# Patient Record
Sex: Male | Born: 1955
Health system: Southern US, Community
[De-identification: ages and names within clinical notes are randomized; demographics above are authoritative.]

## PROBLEM LIST (undated history)

## (undated) DIAGNOSIS — E785 Hyperlipidemia, unspecified: Secondary | ICD-10-CM

## (undated) DIAGNOSIS — R0683 Snoring: Secondary | ICD-10-CM

## (undated) DIAGNOSIS — M5126 Other intervertebral disc displacement, lumbar region: Secondary | ICD-10-CM

## (undated) DIAGNOSIS — M5430 Sciatica, unspecified side: Secondary | ICD-10-CM

## (undated) DIAGNOSIS — K219 Gastro-esophageal reflux disease without esophagitis: Secondary | ICD-10-CM

## (undated) DIAGNOSIS — I10 Essential (primary) hypertension: Secondary | ICD-10-CM

## (undated) DIAGNOSIS — G4700 Insomnia, unspecified: Secondary | ICD-10-CM

## (undated) HISTORY — DX: Sciatica, unspecified side: M54.30

## (undated) HISTORY — PX: WISDOM TOOTH EXTRACTION: SHX21

## (undated) HISTORY — DX: Hyperlipidemia, unspecified: E78.5

## (undated) HISTORY — PX: LUMBAR DISC SURGERY: SHX700

## (undated) HISTORY — DX: Insomnia, unspecified: G47.00

## (undated) HISTORY — DX: Essential (primary) hypertension: I10

## (undated) HISTORY — PX: COLONOSCOPY W/ POLYPECTOMY: SHX1380

## (undated) SURGERY — CLOSED REDUCTION, FRACTURE, NASAL BONE
Anesthesia: General

---

## 2008-10-22 ENCOUNTER — Emergency Department (HOSPITAL_BASED_OUTPATIENT_CLINIC_OR_DEPARTMENT_OTHER): Admission: EM | Admit: 2008-10-22 | Discharge: 2008-10-22 | Payer: Self-pay | Admitting: Emergency Medicine

## 2008-10-22 ENCOUNTER — Ambulatory Visit: Payer: Self-pay | Admitting: Radiology

## 2008-11-08 ENCOUNTER — Encounter (INDEPENDENT_AMBULATORY_CARE_PROVIDER_SITE_OTHER): Payer: Self-pay | Admitting: *Deleted

## 2008-11-16 ENCOUNTER — Ambulatory Visit: Payer: Self-pay | Admitting: Gastroenterology

## 2009-01-02 ENCOUNTER — Encounter (INDEPENDENT_AMBULATORY_CARE_PROVIDER_SITE_OTHER): Payer: Self-pay | Admitting: *Deleted

## 2009-01-09 ENCOUNTER — Ambulatory Visit: Payer: Self-pay | Admitting: Gastroenterology

## 2009-02-07 ENCOUNTER — Ambulatory Visit: Payer: Self-pay | Admitting: Gastroenterology

## 2009-02-12 ENCOUNTER — Encounter: Payer: Self-pay | Admitting: Gastroenterology

## 2009-10-08 ENCOUNTER — Encounter: Admission: RE | Admit: 2009-10-08 | Discharge: 2009-10-08 | Payer: Self-pay | Admitting: Otolaryngology

## 2010-01-23 ENCOUNTER — Telehealth: Payer: Self-pay | Admitting: Gastroenterology

## 2010-02-11 NOTE — Procedures (Signed)
Summary: Colonoscopy  Patient: [name] Note: All result statuses are Final unless otherwise noted.  Tests: (1) Colonoscopy (COL)   COL Colonoscopy           DONE     [facility]     [address]     [place], [place]  [address]           COLONOSCOPY PROCEDURE REPORT           PATIENT:  Mason Vargas, Mason Vargas  MR#:  [id number]     BIRTHDATE:  [birth year], 53 yrs. old  GENDER:  male           ENDOSCOPIST:  Barbette Hair. Arlyce Dice, MD     Referred by:           PROCEDURE DATE:  02/07/2009     PROCEDURE:  Colonoscopy with snare polypectomy     ASA CLASS:  Class I     INDICATIONS:  Routine Risk Screening           MEDICATIONS:   Fentanyl 75 mcg IV, Versed 8 mg IV           DESCRIPTION OF PROCEDURE:   After the risks benefits and     alternatives of the procedure were thoroughly explained, informed     consent was obtained.  Digital rectal exam was performed and     revealed no abnormalities.   The LB CF-H180AL [id number] endoscope     was introduced through the anus and advanced to the cecum, which     was identified by both the appendix and ileocecal valve, without     limitations.  The quality of the prep was excellent, using     MoviPrep.  The instrument was then slowly withdrawn as the colon     was fully examined.     <<PROCEDUREIMAGES>>           FINDINGS:  A pedunculated polyp was found in the sigmoid colon. It     was 2 cm in size. It was found 30 cm from the point of entry.     Polyp was snared, then cauterized with monopolar cautery.     Retrieval was successful (see image12 and image11). snare polyp     Mild diverticulosis was found in the sigmoid colon (see image13).     Retroflexed views in the rectum revealed no abnormalities.    The     scope was then withdrawn from the patient and the procedure     completed.           COMPLICATIONS:  None           ENDOSCOPIC IMPRESSION:     1) 2 cm pedunculated polyp in the sigmoid colon     2) Mild diverticulosis in the sigmoid  colon     RECOMMENDATIONS:     1) colonoscopy           REPEAT EXAM:  In 3 year(s) for Colonoscopy.           ______________________________     Barbette Hair. Arlyce Dice, MD           CC:  Aleatha Borer, MD           n.     Rosalie Doctor:   Barbette Hair. Aziz Slape at 02/07/2009 09:18 AM           Bari Mantis, [id number]  Note: An exclamation mark (!) indicates a result that was not dispersed into the flowsheet.  Document Creation Date: 02/07/2009 9:18 AM _______________________________________________________________________  (1) Order result status: Final Collection or observation date-time: 02/07/2009 09:09 Requested date-time:  Receipt date-time:  Reported date-time:  Referring Physician:   Ordering Physician: Melvia Heaps [phone]) Specimen Source:  Source: Launa Grill Order Number: [id number] Lab site:   Appended Document: Colonoscopy     Procedures Next Due Date:    Colonoscopy: 02/2012

## 2010-02-11 NOTE — Procedures (Signed)
Summary: Colonoscopy  Patient: Mason Vargas Note: All result statuses are Final unless otherwise noted.  Tests: (1) Colonoscopy (COL)   COL Colonoscopy           DONE (C)     [facility]     [address]     [place], [place]  [address]           COLONOSCOPY PROCEDURE REPORT           PATIENT:  [name], [name]  MR#:  [id number]     BIRTHDATE:  [birth year], 53 yrs. old  GENDER:  male           ENDOSCOPIST:  Barbette Hair. Arlyce Dice, MD     Referred by:           PROCEDURE DATE:  02/07/2009     PROCEDURE:  Colonoscopy with snare polypectomy     ASA CLASS:  Class I     INDICATIONS:  Routine Risk Screening           MEDICATIONS:   Fentanyl 75 mcg IV, Versed 8 mg IV, benadryl 25mg      IV           DESCRIPTION OF PROCEDURE:   After the risks benefits and     alternatives of the procedure were thoroughly explained, informed     consent was obtained.  Digital rectal exam was performed and     revealed no abnormalities.   The LB CF-H180AL [id number] endoscope     was introduced through the anus and advanced to the cecum, which     was identified by both the appendix and ileocecal valve, without     limitations.  The quality of the prep was excellent, using     MoviPrep.  The instrument was then slowly withdrawn as the colon     was fully examined.     <<PROCEDUREIMAGES>>           FINDINGS:  A pedunculated polyp was found in the sigmoid colon. It     was 2 cm in size. It was found 30 cm from the point of entry.     Polyp was snared, then cauterized with monopolar cautery.     Retrieval was successful (see image12 and image11). snare polyp     Mild diverticulosis was found in the sigmoid colon (see image13).     Retroflexed views in the rectum revealed no abnormalities.    The     scope was then withdrawn from the patient and the procedure     completed.           COMPLICATIONS:  None           ENDOSCOPIC IMPRESSION:     1) 2 cm pedunculated polyp in the sigmoid colon     2) Mild  diverticulosis in the sigmoid colon     RECOMMENDATIONS:     1) colonoscopy           REPEAT EXAM:  In 3 year(s) for Colonoscopy.           ______________________________     Barbette Hair. Arlyce Dice, MD           CC:  Aleatha Borer, MD           n.     REVISED:  02/07/2009 09:56 AM     eSIGNED:   Barbette Hair. Krissa Utke at 02/07/2009 09:56 AM           Bari Mantis,  [id number]  Note: An exclamation mark (!) indicates a result that was not dispersed into the flowsheet. Document Creation Date: 02/07/2009 9:56 AM _______________________________________________________________________  (1) Order result status: Final Collection or observation date-time: 02/07/2009 09:09 Requested date-time:  Receipt date-time:  Reported date-time:  Referring Physician:   Ordering Physician: Melvia Heaps [phone]) Specimen Source:  Source: Launa Grill Order Number: [id number] Lab site:

## 2010-02-11 NOTE — Letter (Signed)
Summary: Patient Notice- Polyp Results  [facility]  [address] [place], [place] [address]   Phone: [phone]  Fax: [phone]        February 12, 2009 MRN: [id number]    [facility] Mason Vargas [address] [address] [place], [place]  [address]    Dear Mason Vargas,  I am pleased to inform you that the colon polyp(s) removed during your recent colonoscopy was (were) found to be benign (no cancer detected) upon pathologic examination.  I recommend you have a repeat colonoscopy examination in 3_ years to look for recurrent polyps, as having colon polyps increases your risk for having recurrent polyps or even colon cancer in the future.  Should you develop new or worsening symptoms of abdominal pain, bowel habit changes or bleeding from the rectum or bowels, please schedule an evaluation with either your primary care physician or with me.  Additional information/recommendations:  __ No further action with gastroenterology is needed at this time. Please      follow-up with your primary care physician for your other healthcare      needs.  __ Please call [phone] to schedule a return visit to review your      situation.  __ Please keep your follow-up visit as already scheduled.  __x Continue treatment plan as outlined the day of your exam.  Please call us if you are having persistent problems or have questions about your condition that have not been fully answered at this time.  Sincerely,  Mason Meckel MD  This letter has been electronically signed by your physician.  Appended Document: Patient Notice- Polyp Results letter mailed 2.2.11

## 2010-02-13 NOTE — Progress Notes (Signed)
Summary: Question fo rnurse  Phone Note Call from Patient Call back at [facility] Phone [phone]   Call For: Dr Arlyce Dice Reason for Call: Talk to Nurse Summary of Call: Had a Colonlast year January and has a few questions for nurse. Initial call taken by: Leanor Kail [facility],  January 23, 2010 11:26 AM  Follow-up for Phone Call        Left message to call back Follow-up by: Selinda Michaels RN,  January 23, 2010 12:27 PM  Additional Follow-up for Phone Call Additional follow up Details #1::        Patient wanted to check to make sure when his next colon is supposed to be done. Per Dr. Arlyce Dice should repeat colon in 3 years from 1/11. Recall letter is in IDX. Patient aware. Additional Follow-up by: Selinda Michaels RN,  January 23, 2010 3:02 PM

## 2010-04-17 LAB — POCT CARDIAC MARKERS
CKMB, poc: 1.4 ng/mL (ref 1.0–8.0)
Myoglobin, poc: 45.5 ng/mL (ref 12–200)
Myoglobin, poc: 46.4 ng/mL (ref 12–200)
Troponin i, poc: <0.05 ng/mL (ref 0.00–0.09)

## 2010-04-17 LAB — BASIC METABOLIC PANEL
BUN: 17 mg/dL (ref 6–23)
Chloride: 104 mEq/L (ref 96–112)
Creatinine, Ser: 0.9 mg/dL (ref 0.4–1.5)
GFR calc Af Amer: >60 mL/min (ref >60)
Sodium: 141 mEq/L (ref 135–145)

## 2010-04-17 LAB — D-DIMER, QUANTITATIVE: D-Dimer, Quant: <0.22 ug/mL-FEU (ref 0.00–0.48)

## 2010-04-17 LAB — DIFFERENTIAL
Basophils Absolute: 0 10*3/uL (ref 0.0–0.1)
Basophils Relative: 1 % (ref 0–1)
Lymphocytes Relative: 19 % (ref 12–46)
Monocytes Absolute: 0.4 10*3/uL (ref 0.1–1.0)
Monocytes Relative: 7 % (ref 3–12)

## 2010-04-17 LAB — CBC
MCV: 81.3 fL (ref 78.0–100.0)
Platelets: 222 10*3/uL (ref 150–400)

## 2011-06-02 DIAGNOSIS — N529 Male erectile dysfunction, unspecified: Secondary | ICD-10-CM | POA: Insufficient documentation

## 2011-06-11 DIAGNOSIS — E559 Vitamin D deficiency, unspecified: Secondary | ICD-10-CM | POA: Insufficient documentation

## 2011-06-11 DIAGNOSIS — D126 Benign neoplasm of colon, unspecified: Secondary | ICD-10-CM | POA: Insufficient documentation

## 2011-08-10 DIAGNOSIS — Z8249 Family history of ischemic heart disease and other diseases of the circulatory system: Secondary | ICD-10-CM | POA: Insufficient documentation

## 2012-01-13 HISTORY — PX: COLONOSCOPY W/ POLYPECTOMY: SHX1380

## 2012-01-21 ENCOUNTER — Encounter: Payer: Self-pay | Admitting: Gastroenterology

## 2012-02-29 ENCOUNTER — Encounter: Payer: Self-pay | Admitting: Gastroenterology

## 2012-03-01 ENCOUNTER — Encounter: Payer: Self-pay | Admitting: Gastroenterology

## 2012-03-03 DIAGNOSIS — N400 Enlarged prostate without lower urinary tract symptoms: Secondary | ICD-10-CM | POA: Insufficient documentation

## 2012-04-01 ENCOUNTER — Encounter: Payer: Self-pay | Admitting: Gastroenterology

## 2012-08-09 ENCOUNTER — Encounter: Payer: Self-pay | Admitting: Gastroenterology

## 2012-09-08 ENCOUNTER — Encounter: Payer: Self-pay | Admitting: Gastroenterology

## 2012-11-04 ENCOUNTER — Ambulatory Visit (AMBULATORY_SURGERY_CENTER): Payer: Self-pay

## 2012-11-04 VITALS — Ht 72.0 in | Wt 200.0 lb

## 2012-11-04 DIAGNOSIS — Z860100 Personal history of colon polyps, unspecified: Secondary | ICD-10-CM

## 2012-11-04 MED ORDER — SUPREP BOWEL PREP KIT 17.5-3.13-1.6 GM/177ML PO SOLN: 1.0000 | Freq: Once | ORAL | Status: DC

## 2012-11-18 ENCOUNTER — Ambulatory Visit (AMBULATORY_SURGERY_CENTER): Payer: 59 | Admitting: Gastroenterology

## 2012-11-18 ENCOUNTER — Encounter: Payer: Self-pay | Admitting: Gastroenterology

## 2012-11-18 VITALS — BP 117/79 | HR 65 | Temp 96.5°F | Resp 13 | Ht 72.0 in | Wt 200.0 lb

## 2012-11-18 DIAGNOSIS — D126 Benign neoplasm of colon, unspecified: Secondary | ICD-10-CM

## 2012-11-18 DIAGNOSIS — K5730 Diverticulosis of large intestine without perforation or abscess without bleeding: Secondary | ICD-10-CM

## 2012-11-18 DIAGNOSIS — K648 Other hemorrhoids: Secondary | ICD-10-CM

## 2012-11-18 DIAGNOSIS — Z86010 Personal history of colonic polyps: Secondary | ICD-10-CM

## 2012-11-18 MED ORDER — SODIUM CHLORIDE 0.9 % IV SOLN: 500.0000 mL | INTRAVENOUS | Status: DC

## 2012-11-18 NOTE — Progress Notes (Signed)
Patient did not experience any of the following events: a burn prior to discharge; a fall within the facility; wrong site/side/patient/procedure/implant event; or a hospital transfer or hospital admission upon discharge from the facility. (G8907) Patient did not have preoperative order for IV antibiotic SSI prophylaxis. (G8918)  

## 2012-11-18 NOTE — Progress Notes (Signed)
Procedure ends, to recovery, report given and VSS. 

## 2012-11-18 NOTE — Patient Instructions (Signed)

## 2012-11-18 NOTE — Op Note (Signed)
[facility] [address] [place], [address]   COLONOSCOPY PROCEDURE REPORT  PATIENT: Mason Vargas, Mason Vargas  MR#: [id number] BIRTHDATE: 05-01-1955 , 57  yrs. old GENDER: Male ENDOSCOPIST: Louis Meckel, MD REFERRED XB:JYNWG Kathrynn Running, M.D. PROCEDURE DATE:  11/18/2012 PROCEDURE:   Colonoscopy with cold biopsy polypectomy First Screening Colonoscopy - Avg.  risk and is 50 yrs.  old or older - No.  Prior Negative Screening - Now for repeat screening. N/A  History of Adenoma - Now for follow-up colonoscopy & has been > or = to 3 yrs.  Yes hx of adenoma.  Has been 3 or more years since last colonoscopy.  Polyps Removed Today? Yes. ASA CLASS:   Class II INDICATIONS:Patient's personal history of colon polyps. 2011 (2cm pedunculated polyp) MEDICATIONS: MAC sedation, administered by CRNA and Propofol (Diprivan) 270 mg IV  DESCRIPTION OF PROCEDURE:   After the risks benefits and alternatives of the procedure were thoroughly explained, informed consent was obtained.  A digital rectal exam revealed no abnormalities of the rectum.   The LB [id number]  endoscope was introduced through the anus and advanced to the cecum, which was identified by both the appendix and ileocecal valve. No adverse events experienced.   The quality of the prep was excellent using Suprep  The instrument was then slowly withdrawn as the colon was fully examined.      COLON FINDINGS: A sessile polyp measuring 2 mm in size was found in the distal transverse colon.  A polypectomy was performed with cold forceps.   Mild diverticulosis was noted in the sigmoid colon. Internal hemorrhoids were found.   The colon was otherwise normal. There was no diverticulosis, inflammation, polyps or cancers unless previously stated.  Retroflexed views revealed no abnormalities. The time to cecum=3 minutes 27 seconds.  Withdrawal time=9 minutes 03 seconds.  The scope was withdrawn and the procedure  completed. COMPLICATIONS: There were no complications.  ENDOSCOPIC IMPRESSION: 1.   Sessile polyp measuring 2 mm in size was found in the distal transverse colon; polypectomy was performed with cold forceps 2.   Mild diverticulosis was noted in the sigmoid colon 3.   Internal hemorrhoids 4.   The colon was otherwise normal  RECOMMENDATIONS: If the polyp(s) removed today are proven to be adenomatous (pre-cancerous) polyps, you will need a repeat colonoscopy in 5 years.  Otherwise  I recommend followup colonoscopy in 7 years. You will receive a letter within 1-2 weeks with the results of your biopsy as well as final recommendations.  Please call my office if you have not received a letter after 3 weeks.   eSigned:  Louis Meckel, MD 11/18/2012 12:04 PM   cc:   PATIENT NAME:  [name], [name] MR#: [id number]

## 2012-11-18 NOTE — Progress Notes (Signed)
Called to room to assist during endoscopic procedure.  Patient ID and intended procedure confirmed with present staff. Received instructions for my participation in the procedure from the performing physician.  

## 2012-11-21 ENCOUNTER — Telehealth: Payer: Self-pay | Admitting: *Deleted

## 2012-11-21 NOTE — Telephone Encounter (Signed)
Left message that we called for f/u 

## 2012-11-23 ENCOUNTER — Encounter: Payer: Self-pay | Admitting: Gastroenterology

## 2013-10-17 ENCOUNTER — Encounter: Payer: Self-pay | Admitting: Internal Medicine

## 2013-10-17 ENCOUNTER — Ambulatory Visit (INDEPENDENT_AMBULATORY_CARE_PROVIDER_SITE_OTHER): Payer: 59 | Admitting: Internal Medicine

## 2013-10-17 ENCOUNTER — Encounter (INDEPENDENT_AMBULATORY_CARE_PROVIDER_SITE_OTHER): Payer: Self-pay

## 2013-10-17 VITALS — BP 130/90 | HR 74 | Ht 72.0 in | Wt 207.2 lb

## 2013-10-17 DIAGNOSIS — R030 Elevated blood-pressure reading, without diagnosis of hypertension: Secondary | ICD-10-CM

## 2013-10-17 DIAGNOSIS — IMO0001 Reserved for inherently not codable concepts without codable children: Secondary | ICD-10-CM

## 2013-10-17 DIAGNOSIS — R002 Palpitations: Secondary | ICD-10-CM

## 2013-10-17 DIAGNOSIS — Z8249 Family history of ischemic heart disease and other diseases of the circulatory system: Secondary | ICD-10-CM

## 2013-10-17 NOTE — Progress Notes (Signed)
ELECTROPHYSIOLOGY CONSULT NOTE  Patient ID: Mason Vargas, MRN: [id number], DOB/AGE: [birth year] 58 y.o. Admit date: (Not on file) Date of Consult: 10/17/2013  Primary Physician: Vickii Penna., MD Primary Cardiologist: new  Chief Complaint:  palpitations   HPI Mason Vargas is a 58 y.o. male  With 2 month hx of palpitations which is wife (FNP) described as skips and by ECG >> PACs  They have been quiet since the elimination of caffeine  They could last for hours and days and were annoying, notably mostly with sitting, and not with exercise and surprisingly not while lying down   The patient denies chest pain, shortness of breath, nocturnal dyspnea, orthopnea or peripheral edema.  There have been no  lightheadedness or syncope.   Father had heart transplant in his 82s, with "small heart attack" in 27s and 46s  Past Medical History  Diagnosis Date  . Hypertension   . Insomnia   . Hyperlipidemia       Surgical History:  Past Surgical History  Procedure Laterality Date  . Colonoscopy w/ polypectomy       Home Meds: Prior to Admission medications   Medication Sig Start Date End Date Taking? Authorizing Provider  aspirin 81 MG tablet Take 81 mg by mouth daily.   Yes Historical Provider, MD  atorvastatin (LIPITOR) 20 MG tablet Take 20 mg by mouth daily.   Yes Historical Provider, MD  cholecalciferol (VITAMIN D) 400 UNITS TABS tablet Take 5,000 Units by mouth.   Yes Historical Provider, MD  Omega-3 Fatty Acids (FISH OIL) 500 MG CAPS Take by mouth.   Yes Historical Provider, MD     Allergies: No Known Allergies  History   Social History  . Marital Status: Divorced    Spouse Name: N/A    Number of Children: N/A  . Years of Education: N/A   Occupational History  . Not on file.   Social History Main Topics  . Smoking status: Never Smoker   . Smokeless tobacco: Never Used  . Alcohol Use: Yes  . Drug Use: No  . Sexual Activity: Not on file   Other Topics Concern  . Not on  file   Social History Narrative  . No narrative on file     Family History  Problem Relation Age of Onset  . Colon cancer Neg Hx   . Pancreatic cancer Neg Hx   . Stomach cancer Neg Hx   . Heart attack Father   . Heart Problems Father      ROS:  Please see the history of present illness.     All other systems reviewed and negative.    Physical Exam:   Blood pressure 130/90, pulse 74, height 6' (1.829 m), weight 207 lb 4 oz (94.008 kg). Alert and oriented in no acute distress HENT- normal Eyes- EOMI, without scleral icterus Skin- warm and dry; without rashes LN-neg Neck- supple without thyromegaly, JVP-flat, carotids brisk and full without bruits Back-without CVAT or kyphosis Lungs-clear to auscultation CV-Regular rate and rhythm, nl S1 and S2, no murmurs gallops or rubs, S4-absent Abd-soft with active bowel sounds; no midline pulsation or hepatomegaly Pulses-intact femoral and distal MKS-without gross deformity Neuro- Ax O, CN3-12 intact, grossly normal motor and sensory function Affect engaging       Labs: Cardiac Enzymes No results found for this basename: CKTOTAL, CKMB, TROPONINI,  in the last 72 hours CBC Lab Results  Component Value Date   WBC 5.4 10/22/2008   HGB 15.5 10/22/2008  HCT 45.0 10/22/2008   MCV 81.3 10/22/2008   PLT 222 10/22/2008   PROTIME: No results found for this basename: LABPROT, INR,  in the last 72 hours Chemistry No results found for this basename: NA, K, CL, CO2, BUN, CREATININE, CALCIUM, LABALBU, PROT, BILITOT, ALKPHOS, ALT, AST, GLUCOSE,  in the last 168 hours Lipids No results found for this basename: CHOL, HDL, LDLCALC, TRIG   BNP No results found for this basename: probnp   Miscellaneous Lab Results  Component Value Date   DDIMER  Value: <0.22        AT THE INHOUSE ESTABLISHED CUTOFF VALUE OF 0.48 ug/mL FEU, THIS ASSAY HAS BEEN DOCUMENTED IN THE LITERATURE TO HAVE A SENSITIVITY AND NEGATIVE PREDICTIVE VALUE OF AT LEAST 98  TO 99%.  THE TEST RESULT SHOULD BE CORRELATED WITH AN ASSESSMENT OF THE CLINICAL PROBABILITY OF DVT / VTE. 10/22/2008    Radiology/Studies:  No results found.  EKG: NSR 74 13/11/40  Review of outside ECG  >> sinus 70 15/10/38 Isolated PAC  Assessment and Plan:   Palpitations - prob PACs  fhx of premature heart disease  Elevated blood pressure    ECG and wifes auscultation suggests premature beats as opposed to afib.  He has remedied the situation already with near elimination of caffeine and feels great  i have told him of ALIVECOR in theevent that symptoms recur or there is concern regarding the nature of the palpitations  We will explore his family hx as premature CAD would invite ASA but his hx of transplant raises possibility of noniscemic CM and this the possibility of familial disease  Virl Axe

## 2013-10-17 NOTE — Patient Instructions (Signed)
Your physician recommends that you schedule a follow-up appointment in:  As needed  Your physician recommends that you continue on your current medications as directed. Please refer to the Current Medication list given to you today.  

## 2014-03-22 DIAGNOSIS — M5126 Other intervertebral disc displacement, lumbar region: Secondary | ICD-10-CM | POA: Insufficient documentation

## 2014-07-05 ENCOUNTER — Encounter: Payer: Self-pay | Admitting: Gastroenterology

## 2015-01-18 MED FILL — LOSARTAN-HCTZ 50-12.5 MG TA: 50-12.5 | 30 days supply | Qty: 30 | Fill #7

## 2015-02-04 MED FILL — ATORVASTATIN 20 MG TABLET: 20 | 30 days supply | Qty: 30 | Fill #8

## 2015-02-21 MED FILL — LOSARTAN-HCTZ 50-12.5 MG TA: 50-12.5 | 30 days supply | Qty: 30 | Fill #8

## 2015-03-05 MED FILL — ATORVASTATIN 20 MG TABLET: 20 | 30 days supply | Qty: 30 | Fill #9

## 2015-03-21 MED FILL — LOSARTAN-HCTZ 50-12.5 MG TA: 50-12.5 | 30 days supply | Qty: 30 | Fill #9

## 2015-04-02 MED FILL — ATORVASTATIN 20 MG TABLET: 20 | 30 days supply | Qty: 30 | Fill #10

## 2015-04-22 MED FILL — LOSARTAN-HCTZ 50-12.5 MG TA: 50-12.5 | 30 days supply | Qty: 30 | Fill #10

## 2015-04-29 MED FILL — ATORVASTATIN 20 MG TABLET: 20 | 90 days supply | Qty: 90 | Fill #0

## 2015-05-23 MED FILL — LOSARTAN-HCTZ 50-12.5 MG TA: 50-12.5 | 30 days supply | Qty: 30 | Fill #11

## 2015-06-25 MED FILL — LOSARTAN-HCTZ 50-12.5 MG TA: 50-12.5 | 90 days supply | Qty: 90 | Fill #0

## 2015-07-30 MED FILL — ATORVASTATIN 20 MG TABLET: 20 | 90 days supply | Qty: 90 | Fill #1

## 2015-09-20 MED FILL — LOSARTAN-HCTZ 50-12.5 MG TA: 50-12.5 | 90 days supply | Qty: 90 | Fill #1

## 2015-10-29 MED FILL — ATORVASTATIN 20 MG TABLET: 20 | 90 days supply | Qty: 90 | Fill #2

## 2015-12-24 MED FILL — LOSARTAN-HCTZ 50-12.5 MG TA: 50-12.5 | 90 days supply | Qty: 90 | Fill #2

## 2015-12-26 MED FILL — OMEPRAZOLE DR 40 MG CAPSULE: 40 | 30 days supply | Qty: 60 | Fill #0

## 2016-01-20 MED FILL — OMEPRAZOLE DR 40 MG CAPSULE: 40 | 30 days supply | Qty: 60 | Fill #1

## 2016-01-31 MED FILL — ATORVASTATIN 20 MG TABLET: 20 | 90 days supply | Qty: 90 | Fill #3

## 2016-03-19 MED FILL — OMEPRAZOLE DR 20 MG CAPSULE: 20 | 90 days supply | Qty: 90 | Fill #0

## 2016-04-01 MED FILL — LOSARTAN-HCTZ 50-12.5 MG TA: 50-12.5 | 90 days supply | Qty: 90 | Fill #3

## 2016-04-23 MED FILL — ATORVASTATIN 20 MG TABLET: 20 | 90 days supply | Qty: 90 | Fill #0

## 2016-06-25 DIAGNOSIS — K219 Gastro-esophageal reflux disease without esophagitis: Secondary | ICD-10-CM | POA: Diagnosis not present

## 2016-06-25 DIAGNOSIS — S93422A Sprain of deltoid ligament of left ankle, initial encounter: Secondary | ICD-10-CM | POA: Diagnosis not present

## 2016-06-25 DIAGNOSIS — Z0000 Encounter for general adult medical examination without abnormal findings: Secondary | ICD-10-CM | POA: Diagnosis not present

## 2016-06-25 DIAGNOSIS — I10 Essential (primary) hypertension: Secondary | ICD-10-CM | POA: Diagnosis not present

## 2016-06-25 MED FILL — LOSARTAN-HCTZ 50-12.5 MG TA: 50-12.5 | 90 days supply | Qty: 90 | Fill #0

## 2016-07-13 DIAGNOSIS — I10 Essential (primary) hypertension: Secondary | ICD-10-CM | POA: Diagnosis not present

## 2016-07-13 DIAGNOSIS — M1812 Unilateral primary osteoarthritis of first carpometacarpal joint, left hand: Secondary | ICD-10-CM | POA: Diagnosis not present

## 2016-07-13 DIAGNOSIS — M25542 Pain in joints of left hand: Secondary | ICD-10-CM | POA: Diagnosis not present

## 2016-07-24 MED FILL — ATORVASTATIN 20 MG TABLET: 20 | 90 days supply | Qty: 90 | Fill #1

## 2016-10-26 MED FILL — ATORVASTATIN 20 MG TABLET: 20 | 90 days supply | Qty: 90 | Fill #2

## 2016-10-30 DIAGNOSIS — R1031 Right lower quadrant pain: Secondary | ICD-10-CM | POA: Diagnosis not present

## 2016-10-30 DIAGNOSIS — K5900 Constipation, unspecified: Secondary | ICD-10-CM | POA: Diagnosis not present

## 2016-10-30 DIAGNOSIS — R197 Diarrhea, unspecified: Secondary | ICD-10-CM | POA: Diagnosis not present

## 2016-12-30 MED FILL — OMEPRAZOLE 20 MG CAP: 20 | 90 days supply | Qty: 90 | Fill #1

## 2017-01-19 MED FILL — ATORVASTATIN 20 MG TABLET: 20 | 90 days supply | Qty: 90 | Fill #3

## 2017-02-10 MED FILL — OSELTAMIVIR PHOSPHATE 75 MG: 75 | 10 days supply | Qty: 10 | Fill #0

## 2017-04-12 MED FILL — ATORVASTATIN 20 MG TABLET: 20 | 90 days supply | Qty: 90 | Fill #0

## 2017-04-12 MED FILL — OMEPRAZOLE 20 MG CAP: 20 | 90 days supply | Qty: 90 | Fill #0

## 2017-04-29 DIAGNOSIS — M545 Low back pain, unspecified: Secondary | ICD-10-CM | POA: Insufficient documentation

## 2017-04-29 DIAGNOSIS — Z6825 Body mass index (BMI) 25.0-25.9, adult: Secondary | ICD-10-CM | POA: Diagnosis not present

## 2017-04-29 DIAGNOSIS — I10 Essential (primary) hypertension: Secondary | ICD-10-CM | POA: Diagnosis not present

## 2017-04-29 DIAGNOSIS — Z0000 Encounter for general adult medical examination without abnormal findings: Secondary | ICD-10-CM | POA: Diagnosis not present

## 2017-04-29 MED FILL — LOSARTAN-HCTZ 50-12.5 MG TA: 50-12.5 | 90 days supply | Qty: 90 | Fill #0

## 2017-07-19 MED FILL — ATORVASTATIN 20 MG TABLET: 20 | 90 days supply | Qty: 90 | Fill #0

## 2017-09-02 MED FILL — OMEPRAZOLE 20 MG CAP: 20 | 90 days supply | Qty: 90 | Fill #1

## 2017-10-18 MED FILL — LOSARTAN-HCTZ 50-12.5 MG TA: 50-12.5 | 90 days supply | Qty: 90 | Fill #1

## 2017-10-18 MED FILL — ATORVASTATIN CALCIUM 20 MG: 20 | 90 days supply | Qty: 90 | Fill #1

## 2018-01-18 MED FILL — ATORVASTATIN CALCIUM 20 MG: 20 | 90 days supply | Qty: 90 | Fill #2

## 2018-03-14 ENCOUNTER — Ambulatory Visit: Payer: 59 | Admitting: Family Medicine

## 2018-03-15 DIAGNOSIS — L821 Other seborrheic keratosis: Secondary | ICD-10-CM | POA: Diagnosis not present

## 2018-03-15 DIAGNOSIS — D225 Melanocytic nevi of trunk: Secondary | ICD-10-CM | POA: Diagnosis not present

## 2018-03-15 DIAGNOSIS — L820 Inflamed seborrheic keratosis: Secondary | ICD-10-CM | POA: Diagnosis not present

## 2018-03-15 DIAGNOSIS — D2239 Melanocytic nevi of other parts of face: Secondary | ICD-10-CM | POA: Diagnosis not present

## 2018-03-15 DIAGNOSIS — L814 Other melanin hyperpigmentation: Secondary | ICD-10-CM | POA: Diagnosis not present

## 2018-03-15 MED FILL — TRIAMCINOLONE 0.1% OINTMENT: 0.1 | 14 days supply | Qty: 80 | Fill #0

## 2018-03-20 ENCOUNTER — Encounter (HOSPITAL_COMMUNITY): Payer: Self-pay | Admitting: *Deleted

## 2018-03-20 ENCOUNTER — Ambulatory Visit (HOSPITAL_COMMUNITY)
Admission: EM | Admit: 2018-03-20 | Discharge: 2018-03-20 | Disposition: A | Discharge status: Home / Self Care | Payer: BLUE CROSS/BLUE SHIELD | Attending: Family Medicine | Admitting: Family Medicine

## 2018-03-20 DIAGNOSIS — W19XXXA Unspecified fall, initial encounter: Secondary | ICD-10-CM | POA: Diagnosis not present

## 2018-03-20 DIAGNOSIS — I10 Essential (primary) hypertension: Secondary | ICD-10-CM | POA: Diagnosis not present

## 2018-03-20 DIAGNOSIS — M5432 Sciatica, left side: Secondary | ICD-10-CM | POA: Diagnosis not present

## 2018-03-20 DIAGNOSIS — R000 Tachycardia, unspecified: Secondary | ICD-10-CM

## 2018-03-20 HISTORY — DX: Other intervertebral disc displacement, lumbar region: M51.26

## 2018-03-20 MED ORDER — HYDROCODONE-ACETAMINOPHEN 5-325 MG PO TABS: 1.0000 | ORAL_TABLET | Freq: Four times a day (QID) | ORAL | 0 refills | Status: DC | PRN

## 2018-03-20 MED ORDER — PREDNISONE 50 MG PO TABS: ORAL_TABLET | ORAL | 0 refills | Status: DC

## 2018-03-20 NOTE — Discharge Instructions (Addendum)
After the prednisone runs out, you can use ibuprofen  If pain worsens or persists, please return or see your primary care provider.

## 2018-03-20 NOTE — ED Triage Notes (Signed)
Reports hx  Herniated lumbar disc.  Yesterday jumped off a log and felt a "pop" with significant low back pain.  Pain radiating down into LLE, but denies any parasthesias.  Denies any bowel or urinary incontinence.  Pt ambulatory, but guarded with ambulation.

## 2018-03-20 NOTE — ED Provider Notes (Signed)
[facility]    CSN: [id number] Arrival date & time: 03/20/18  1045     History   Chief Complaint Chief Complaint  Patient presents with  . Back Pain    HPI Mason Vargas is a 63 y.o. male.   New patient with history of L2- L3 herniated disk that acts up every 5- 6 years  Jumped a creek yesterday, was unable to get up without assistance, pain in left lower back going down into front of thigh, associated with numbness and tingling. Numbness and tingling have now resolved  Able to walk better today.  Also injured left hand when he fell.     Past Medical History:  Diagnosis Date  . Hyperlipidemia   . Hypertension   . Insomnia   . Lumbar herniated disc     There are no active problems to display for this patient.   Past Surgical History:  Procedure Laterality Date  . COLONOSCOPY W/ POLYPECTOMY         Home Medications    Prior to Admission medications   Medication Sig Start Date End Date Taking? Authorizing Provider  aspirin 81 MG tablet Take 81 mg by mouth daily.   Yes [provider]  atorvastatin (LIPITOR) 20 MG tablet Take 20 mg by mouth daily.   Yes [provider]  LOSARTAN POTASSIUM-HCTZ PO Take by mouth.   Yes [provider]  OMEPRAZOLE PO Take by mouth.   Yes [provider]  cholecalciferol (VITAMIN D) 400 UNITS TABS tablet Take 5,000 Units by mouth.    [provider]  HYDROcodone-acetaminophen (NORCO) 5-325 MG tablet Take 1 tablet by mouth every 6 (six) hours as needed for moderate pain. 03/20/18   Robyn Haber, MD  Omega-3 Fatty Acids (FISH OIL) 500 MG CAPS Take by mouth.    [provider]  predniSONE (DELTASONE) 50 MG tablet One daily with food 03/20/18   Robyn Haber, MD    Family History Family History  Problem Relation Age of Onset  . Heart attack Father   . Heart Problems Father   . Colon cancer Neg Hx   . Pancreatic cancer Neg Hx   . Stomach cancer Neg Hx      Social History Social History   Tobacco Use  . Smoking status: Never Smoker  . Smokeless tobacco: Never Used  Substance Use Topics  . Alcohol use: Yes    Comment: occasionally  . Drug use: No     Allergies   Patient has no known allergies.   Review of Systems Review of Systems  Genitourinary: Negative for difficulty urinating.  Musculoskeletal: Positive for back pain.  All other systems reviewed and are negative.    Physical Exam Triage Vital Signs ED Triage Vitals  Enc Vitals Group     BP 03/20/18 1105 125/80     Pulse Rate 03/20/18 1105 (!) 125     Resp 03/20/18 1105 18     Temp 03/20/18 1105 98.1 F (36.7 C)     Temp Source 03/20/18 1105 Oral     SpO2 03/20/18 1105 100 %     Weight --      Height --      Head Circumference --      Peak Flow --      Pain Score 03/20/18 1106 9     Pain Loc --      Pain Edu? --      Excl. in GC? --    No  data found.  Updated Vital Signs BP 125/80   Pulse (!) 125   Temp 98.1 F (36.7 C) (Oral)   Resp 18   SpO2 100%    Physical Exam Vitals signs and nursing note reviewed.  Constitutional:      Comments: Appears uncomfortable  Eyes:     Extraocular Movements: Extraocular movements intact.     Pupils: Pupils are equal, round, and reactive to light.  Neck:     Musculoskeletal: Normal range of motion and neck supple. No muscular tenderness.  Cardiovascular:     Rate and Rhythm: Regular rhythm. Tachycardia present.  Pulmonary:     Effort: Pulmonary effort is normal.  Musculoskeletal:     Comments: Good strength in thighs  SLR on left is limited because of pain Back is tender left paralumbar region. Able to dorsiflex and plantar flex feet normally (uncomfortable)  Left hand: no deformity.  Tender middle metacarpal  Skin:    General: Skin is warm and dry.  Neurological:     General: No focal deficit present.     Mental Status: He is alert and oriented to person, place, and time.      UC Treatments /  Results  Labs (all labs ordered are listed, but only abnormal results are displayed) Labs Reviewed - No data to display  EKG None  Radiology No results found.  Procedures Procedures (including critical care time)  Medications Ordered in UC Medications - No data to display  Initial Impression / Assessment and Plan / UC Course  I have reviewed the triage vital signs and the nursing notes.  Pertinent labs & imaging results that were available during my care of the patient were reviewed by me and considered in my medical decision making (see chart for details).    Final Clinical Impressions(s) / UC Diagnoses   Final diagnoses:  Sciatica of left side     Discharge Instructions     After the prednisone runs out, you can use ibuprofen  If pain worsens or persists, please return or see your primary care provider.    ED Prescriptions    Medication Sig Dispense Auth. Provider   HYDROcodone-acetaminophen (NORCO) 5-325 MG tablet Take 1 tablet by mouth every 6 (six) hours as needed for moderate pain. 12 tablet Robyn Haber, MD   predniSONE (DELTASONE) 50 MG tablet One daily with food 5 tablet Robyn Haber, MD     Controlled Substance Prescriptions [place] Controlled Substance Registry consulted? Not Applicable   Robyn Haber, MD 03/20/18 1152

## 2018-03-22 ENCOUNTER — Ambulatory Visit (INDEPENDENT_AMBULATORY_CARE_PROVIDER_SITE_OTHER): Payer: BLUE CROSS/BLUE SHIELD | Admitting: Family Medicine

## 2018-03-22 ENCOUNTER — Encounter: Payer: Self-pay | Admitting: Family Medicine

## 2018-03-22 VITALS — BP 122/88 | HR 121 | Temp 98.0°F | Ht 72.5 in | Wt 202.3 lb

## 2018-03-22 DIAGNOSIS — E785 Hyperlipidemia, unspecified: Secondary | ICD-10-CM

## 2018-03-22 DIAGNOSIS — Z7689 Persons encountering health services in other specified circumstances: Secondary | ICD-10-CM | POA: Diagnosis not present

## 2018-03-22 DIAGNOSIS — M5126 Other intervertebral disc displacement, lumbar region: Secondary | ICD-10-CM

## 2018-03-22 DIAGNOSIS — I10 Essential (primary) hypertension: Secondary | ICD-10-CM | POA: Insufficient documentation

## 2018-03-22 DIAGNOSIS — K219 Gastro-esophageal reflux disease without esophagitis: Secondary | ICD-10-CM | POA: Insufficient documentation

## 2018-03-22 DIAGNOSIS — M62830 Muscle spasm of back: Secondary | ICD-10-CM

## 2018-03-22 DIAGNOSIS — M5442 Lumbago with sciatica, left side: Secondary | ICD-10-CM | POA: Insufficient documentation

## 2018-03-22 MED ORDER — CYCLOBENZAPRINE HCL 10 MG PO TABS: 10.0000 mg | ORAL_TABLET | Freq: Three times a day (TID) | ORAL | 0 refills | Status: DC | PRN

## 2018-03-22 MED ORDER — AMITRIPTYLINE HCL 25 MG PO TABS: ORAL_TABLET | ORAL | 0 refills | Status: DC

## 2018-03-22 NOTE — Patient Instructions (Signed)
Please realize, EXERCISE IS MEDICINE!  -  American Heart Association ( AHA) guidelines for exercise : If you are in good health, without any medical conditions, you should engage in 150-300 minutes of moderate intensity aerobic activity per week.  This means you should be huffing and puffing throughout your workout.   Engaging in regular exercise will improve brain function and memory, as well as improve mood, boost immune system and help with weight management.  As well as the other, more well-known effects of exercise such as decreasing blood sugar levels, decreasing blood pressure,  and decreasing bad cholesterol levels/ increasing good cholesterol levels.     -  The AHA strongly endorses consumption of a diet that contains a variety of foods from all the food categories with an emphasis on fruits and vegetables; fat-free and low-fat dairy products; cereal and grain products; legumes and nuts; and fish, poultry, and/or extra lean meats.    Excessive food intake, especially of foods high in saturated and trans fats, sugar, and salt, should be avoided.    Adequate water intake of roughly 1/2 of your weight in pounds, should equal the ounces of water per day you should drink.  So for instance, if you're 200 pounds, that would be 100 ounces of water per day.         Mediterranean Diet  Why follow it? Research shows. . Those who follow the Mediterranean diet have a reduced risk of heart disease  . The diet is associated with a reduced incidence of Parkinson's and Alzheimer's diseases . People following the diet may have longer life expectancies and lower rates of chronic diseases  . The Dietary Guidelines for Americans recommends the Mediterranean diet as an eating plan to promote health and prevent disease  What Is the Mediterranean Diet?  . Healthy eating plan based on typical foods and recipes of Mediterranean-style cooking . The diet is primarily a plant based diet; these foods should make up a  majority of meals   Starches - Plant based foods should make up a majority of meals - They are an important sources of vitamins, minerals, energy, antioxidants, and fiber - Choose whole grains, foods high in fiber and minimally processed items  - Typical grain sources include wheat, oats, barley, corn, Mode rice, bulgar, farro, millet, polenta, couscous  - Various types of beans include chickpeas, lentils, fava beans, black beans, white beans   Fruits  Veggies - Large quantities of antioxidant rich fruits & veggies; 6 or more servings  - Vegetables can be eaten raw or lightly drizzled with oil and cooked  - Vegetables common to the traditional Mediterranean Diet include: artichokes, arugula, beets, broccoli, brussel sprouts, cabbage, carrots, celery, collard greens, cucumbers, eggplant, kale, leeks, lemons, lettuce, mushrooms, okra, onions, peas, peppers, potatoes, pumpkin, radishes, rutabaga, shallots, spinach, sweet potatoes, turnips, zucchini - Fruits common to the Mediterranean Diet include: apples, apricots, avocados, cherries, clementines, dates, figs, grapefruits, grapes, melons, nectarines, oranges, peaches, pears, pomegranates, strawberries, tangerines  Fats - Replace butter and margarine with healthy oils, such as olive oil, canola oil, and tahini  - Limit nuts to no more than a handful a day  - Nuts include walnuts, almonds, pecans, pistachios, pine nuts  - Limit or avoid candied, honey roasted or heavily salted nuts - Olives are central to the Mediterranean diet - can be eaten whole or used in a variety of dishes   Meats Protein - Limiting red meat: no more than a few times a month -   When eating red meat: choose lean cuts and keep the portion to the size of deck of cards - Eggs: approx. 0 to 4 times a week  - Fish and lean poultry: at least 2 a week  - Healthy protein sources include, chicken, [place], lean beef, lamb - Increase intake of seafood such as tuna, salmon, trout,  mackerel, shrimp, scallops - Avoid or limit high fat processed meats such as sausage and bacon  Dairy - Include moderate amounts of low fat dairy products  - Focus on healthy dairy such as fat free yogurt, skim milk, low or reduced fat cheese - Limit dairy products higher in fat such as whole or 2% milk, cheese, ice cream  Alcohol - Moderate amounts of red wine is ok  - No more than 5 oz daily for women (all ages) and men older than age 72  - No more than 10 oz of wine daily for men younger than 12  Other - Limit sweets and other desserts  - Use herbs and spices instead of salt to flavor foods  - Herbs and spices common to the traditional Mediterranean Diet include: basil, bay leaves, chives, cloves, cumin, fennel, garlic, lavender, marjoram, mint, oregano, parsley, pepper, rosemary, sage, savory, sumac, tarragon, thyme   It's not just a diet, it's a lifestyle:  . The Mediterranean diet includes lifestyle factors typical of those in the region  . Foods, drinks and meals are best eaten with others and savored . Daily physical activity is important for overall good health . This could be strenuous exercise like running and aerobics . This could also be more leisurely activities such as walking, housework, yard-work, or taking the stairs . Moderation is the key; a balanced and healthy diet accommodates most foods and drinks . Consider portion sizes and frequency of consumption of certain foods   Meal Ideas & Options:  . Breakfast:  o Whole wheat toast or whole wheat English muffins with peanut butter & hard boiled egg o Steel cut oats topped with apples & cinnamon and skim milk  o Fresh fruit: banana, strawberries, melon, berries, peaches  o Smoothies: strawberries, bananas, greek yogurt, peanut butter o Low fat greek yogurt with blueberries and granola  o Egg white omelet with spinach and mushrooms o Breakfast couscous: whole wheat couscous, apricots, skim milk, cranberries  . Sandwiches:   o Hummus and grilled vegetables (peppers, zucchini, squash) on whole wheat bread   o Grilled chicken on whole wheat pita with lettuce, tomatoes, cucumbers or tzatziki  o Tuna salad on whole wheat bread: tuna salad made with greek yogurt, olives, red peppers, capers, green onions o Garlic rosemary lamb pita: lamb sauted with garlic, rosemary, salt & pepper; add lettuce, cucumber, greek yogurt to pita - flavor with lemon juice and black pepper  . Seafood:  o Mediterranean grilled salmon, seasoned with garlic, basil, parsley, lemon juice and black pepper o Shrimp, lemon, and spinach whole-grain pasta salad made with low fat greek yogurt  o Seared scallops with lemon orzo  o Seared tuna steaks seasoned salt, pepper, coriander topped with tomato mixture of olives, tomatoes, olive oil, minced garlic, parsley, green onions and cappers  . Meats:  o Herbed greek chicken salad with kalamata olives, cucumber, feta  o Red bell peppers stuffed with spinach, bulgur, lean ground beef (or lentils) & topped with feta   o Kebabs: skewers of chicken, tomatoes, onions, zucchini, squash  o [place] burgers: made with red onions, mint, dill, lemon juice, feta  cheese topped with roasted red peppers . Vegetarian o Cucumber salad: cucumbers, artichoke hearts, celery, red onion, feta cheese, tossed in olive oil & lemon juice  o Hummus and whole grain pita points with a greek salad (lettuce, tomato, feta, olives, cucumbers, red onion) o Lentil soup with celery, carrots made with vegetable broth, garlic, salt and pepper  o Tabouli salad: parsley, bulgur, mint, scallions, cucumbers, tomato, radishes, lemon juice, olive oil, salt and pepper.     Acute Back Pain, Adult Acute back pain is sudden and usually short-lived. It is often caused by an injury to the muscles and tissues in the back. The injury may result from:  A muscle or ligament getting overstretched or torn (strained). Ligaments are tissues that connect bones  to each other. Lifting something improperly can cause a back strain.  Wear and tear (degeneration) of the spinal disks. Spinal disks are circular tissue that provides cushioning between the bones of the spine (vertebrae).  Twisting motions, such as while playing sports or doing yard work.  A hit to the back.  Arthritis. You may have a physical exam, lab tests, and imaging tests to find the cause of your pain. Acute back pain usually goes away with rest and home care. Follow these instructions at home: Managing pain, stiffness, and swelling  Take over-the-counter and prescription medicines only as told by your health care provider.  Your health care provider may recommend applying ice during the first 24-48 hours after your pain starts. To do this: ? Put ice in a plastic bag. ? Place a towel between your skin and the bag. ? Leave the ice on for 20 minutes, 2-3 times a day.  If directed, apply heat to the affected area as often as told by your health care provider. Use the heat source that your health care provider recommends, such as a moist heat pack or a heating pad. ? Place a towel between your skin and the heat source. ? Leave the heat on for 20-30 minutes. ? Remove the heat if your skin turns bright red. This is especially important if you are unable to feel pain, heat, or cold. You have a greater risk of getting burned. Activity   Do not stay in bed. Staying in bed for more than 1-2 days can delay your recovery.  Sit up and stand up straight. Avoid leaning forward when you sit, or hunching over when you stand. ? If you work at a desk, sit close to it so you do not need to lean over. Keep your chin tucked in. Keep your neck drawn back, and keep your elbows bent at a right angle. Your arms should look like the letter "L." ? Sit high and close to the steering wheel when you drive. Add lower back (lumbar) support to your car seat, if needed.  Take short walks on even surfaces as  soon as you are able. Try to increase the length of time you walk each day.  Do not sit, drive, or stand in one place for more than 30 minutes at a time. Sitting or standing for long periods of time can put stress on your back.  Do not drive or use heavy machinery while taking prescription pain medicine.  Use proper lifting techniques. When you bend and lift, use positions that put less stress on your back: ? [place] your knees. ? Keep the load close to your body. ? Avoid twisting.  Exercise regularly as told by your health care provider. Exercising  helps your back heal faster and helps prevent back injuries by keeping muscles strong and flexible.  Work with a physical therapist to make a safe exercise program, as recommended by your health care provider. Do any exercises as told by your physical therapist. Lifestyle  Maintain a healthy weight. Extra weight puts stress on your back and makes it difficult to have good posture.  Avoid activities or situations that make you feel anxious or stressed. Stress and anxiety increase muscle tension and can make back pain worse. Learn ways to manage anxiety and stress, such as through exercise. General instructions  Sleep on a firm mattress in a comfortable position. Try lying on your side with your knees slightly bent. If you lie on your back, put a pillow under your knees.  Follow your treatment plan as told by your health care provider. This may include: ? Cognitive or behavioral therapy. ? Acupuncture or massage therapy. ? Meditation or yoga. Contact a health care provider if:  You have pain that is not relieved with rest or medicine.  You have increasing pain going down into your legs or buttocks.  Your pain does not improve after 2 weeks.  You have pain at night.  You lose weight without trying.  You have a fever or chills. Get help right away if:  You develop new bowel or bladder control problems.  You have unusual weakness or  numbness in your arms or legs.  You develop nausea or vomiting.  You develop abdominal pain.  You feel faint. Summary  Acute back pain is sudden and usually short-lived.  Use proper lifting techniques. When you bend and lift, use positions that put less stress on your back.  Take over-the-counter and prescription medicines and apply heat or ice as directed by your health care provider. This information is not intended to replace advice given to you by your health care provider. Make sure you discuss any questions you have with your health care provider. Document Released: 12/29/2004 Document Revised: 08/05/2017 Document Reviewed: 08/12/2016 [employer] Interactive Patient Education  2019 [employer].

## 2018-03-22 NOTE — Progress Notes (Signed)
New patient office visit note:  Impression and Recommendations:    1. Encounter to establish care with new doctor   2. Hyperlipidemia, unspecified hyperlipidemia type   3. Hypertension, unspecified type   4. Gastroesophageal reflux disease, esophagitis presence not specified   5. Lumbar herniated disc-   since injury in early 20s.   6. New onset --->    acute bilateral low back pain with left-sided sciatica (first flare in over 15 years )    7. Muscle spasm of back     1. Encounter to Establish Care with New Doctor - Extensive discussion held with patient regarding establishing as a new patient.  Discussed policies and practices here at the clinic, and answered all questions about care team and health management during appointment.  - Discussed need for patient to continue to obtain management and screenings with all established specialists.  Educated patient at length about the critical importance of keeping health maintenance up to date.  - Participated in lengthy conversation and all questions were answered.  - Need for complete physical and fasting lab work in near future.  2. GERD - Advised patient that omeprazole has a less desirable side-effect profile than PPI's. - Advised patient to discontinue omeprazole and begin a PPI like Zantac twice daily.  - If patient must use omeprazole because other options did not work, he knows he may resume.  3. Acute Back Pain - Lumbar, Neurogenic Pain - Evaluated and treated recently at Urgent Care. - Given patient's description of the pain shooting down his left leg, discussed neuropathic pain.  - Elavil prescribed today. - Discussed use of Elavil to help with neuropathic pain at night. - Reviewed Neurontin, Gabapentin, Lyrica as additional options for neuropathic relief. - Discussed that we can always add on Lyrica or Neurontin to treatment plan in the future PRN.  - Flexeril provided today. - Reviewed that flexeril may  cause feelings of sedation and sleepiness. - Continue on prednisone as prescribed by Urgent Care.  - Recommended use of NSAID's during acute recovery period. - Take Zantac/PPI's along with NSAID use to help protect the stomach.  - Discussed need for referral to [facility] certified physical therapist for lumbar back pain. - Referral provided today.   - Supportive care discussed with patient at length.  All questions were answered.  - Discussed that treatment plan will be: - Utilize elavil, flexeril, and continue on steroids prescribed by Urgent Care. - Obtain PT in near future as referred. - If needed, patient may obtain interventional management and injections for pain in future.    Education and routine counseling performed. Handouts provided.  Pt was interviewed and evaluated by me in the clinic today for 32.5+ minutes, with over 50% time spent in face to face counseling of patients various medical conditions, treatment plans of those medical conditions including medicine management and lifestyle modification, strategies to improve health and well being; and in coordination of care. SEE ABOVE TREATMENT PLAN FOR DETAILS    Orders Placed This Encounter  Procedures  . Ambulatory referral to Physical Therapy    Meds ordered this encounter  Medications  . DISCONTD: cyclobenzaprine (FLEXERIL) 10 MG tablet    Sig: Take 1 tablet (10 mg total) by mouth 3 (three) times daily as needed for muscle spasms.    Dispense:  30 tablet    Refill:  0  . amitriptyline (ELAVIL) 25 MG tablet    Sig: 1-2 p.o. nightly as needed pain  Dispense:  90 tablet    Refill:  0  . cyclobenzaprine (FLEXERIL) 10 MG tablet    Sig: Take 1 tablet (10 mg total) by mouth 3 (three) times daily as needed for muscle spasms.    Dispense:  30 tablet    Refill:  0    Gross side effects, risk and benefits, and alternatives of medications discussed with patient.  Patient is aware that all medications have potential  side effects and we are unable to predict every side effect or drug-drug interaction that may occur.  Expresses verbal understanding and consents to current therapy plan and treatment regimen.  Return for CPE/ yrly physical, come fasting near future; also 2) OV follow-up 2 to 3 months for back problems.  Please see AVS handed out to patient at the end of our visit for further patient instructions/ counseling done pertaining to today's office visit.    Note:  This document was prepared using Dragon voice recognition software and may include unintentional dictation errors.   This document serves as a record of services personally performed by Mellody Dance, DO. It was created on her behalf by Toni Amend, a trained medical scribe. The creation of this record is based on the scribe's personal observations and the provider's statements to them.   I have reviewed the above medical documentation for accuracy and completeness and I concur.  Mellody Dance, DO 03/22/2018 8:02 PM       ---------------------------------------------------------------------------------------------------------------------------    Subjective:    Chief complaint:   Chief Complaint  Patient presents with  . Establish Care     HPI: Mason Vargas is a pleasant 63 y.o. male who presents to [place] at [facility] today to review their medical history with me and establish care.   I asked the patient to review their chronic problem list with me to ensure everything was updated and accurate.    All recent office visits with other providers, any medical records that patient brought in etc  - I reviewed today.     We asked pt to get [place] their medical records from [facility] providers/ specialists that they had seen within the past 3-5 years- if they are in private practice and/or do not work for [employer], [facility], [facility], [facility] or [employer] owned practice.  Told them to call their specialists to  clarify this if they are not sure.    Previous provider was Dr. Molli Barrows through [facility].  Here today with wife.  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 Has been working with [employer] for nine years. Works at the [employer], Government social research officer.  Worked at [employer] for 18 months. 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  Married to wife, Jeani Hawking. Wife is an NP in [facility]. Monogamous with partner.  He has two stepchildren. They are ages 23 and 84. No grandchildren yet.  Tobacco Use Never smoker.  EtOH Use Has a quarter of a drink per week. One drink per month.  Has about five cups of caffeine per day. Notes he drinks diet coke.  Not satisfied with his weight. Went on keto diet a couple of years ago. Did well on keto, got down to a "really nice weight." Last Christmas and Thanksgiving happened and he picked up over 20 lbs.  Exercise Habits He does not engage in cardiovascular activity.  He and his wife used to run and engage in regular physical activity, but not in the last  year.  Family History Dad with HLD and HTN. Notes that dad had a bad heart. Had a heart attack at age 6, 2, and 75. Dad got a new heart at age 37. He didn't exercise at all, ate awfully, wasn't fat. "Just didn't take care of himself, was just driving a cab and then sit and watch sports on TV."  One brother died of lung cancer at age 71.  Heavy smoker. Other siblings are healthy.  Past Medical History  - Hypertension He has high blood pressure. Notes he is managed on medications.  He has been diagnosed with HTN for about 12 years.  Once he dropped his weight, he was able to drop down to half a pill. Notes didn't even need pills when he lost enough weight.  - Hyperlipidemia Takes cholesterol medicine.  Has been diagnosed/managed for 15+ years  - GERD Takes omeprazole daily for GERD.  Has not tried PPI's.  - Acute Back Pain &  history of Herniated Disc in L2-L3 since age 14  Patient has had a herniated disc in L2-L3 since age 35. The cause of this injury was lifting some improper equipment at work, boxes.  Notes he is in horrible back pain today.  He hasn't had pain like this for about 20 years.  Onset:  States he "jacked his back up on Saturday."  This weekend, near [place], decided to jump off the log onto the ground and felt a "lightning bolt" go down his leg.  He fell after this, laid down in the ditch for quite a while until relatives found him.  Notes the pain was so bad he could not move.  He required assistance to ambulate.  Describes uncontrollable pain on Saturday night; went to the [facility] on Sunday and was given painkillers and prednisone, 50 mg of prednisone per day for 5 days.  States he was given Vicodin and hydrocodone after Urgent Care visit.  He did not have an X-ray performed.   Notes that his current treatment plan has helped.  However, pain last night was brutal because "it hurts no matter which way he turns."  Notes "hurts like hell."  Puts pillows between and under his legs, tried the fetal position while sleeping, but nothing helped.  In the past, it has taken him 3-4 weeks to get over this sort of pain.  Notes that muscle relaxers have helped him get through it before.  The shooting pain down his left leg comes and goes, depending on his movement.  "If I'm turning in bed, I have to turn a specific way or I'll 'catch it' and it hurts like crap."  Notes the pain is mainly in his lower back now, not so much shooting down his leg."  He has tried PT in the past and PT has helped (going 3x per week).  Denies abnormalities in muscle function or loss of bowel control.   Wt Readings from Last 3 Encounters:  03/22/18 202 lb 4.8 oz (91.8 kg)  10/17/13 207 lb 4 oz (94 kg)  11/18/12 200 lb (90.7 kg)   BP Readings from Last 3 Encounters:  03/22/18 122/88  03/20/18 125/80    10/17/13 130/90   Pulse Readings from Last 3 Encounters:  03/22/18 (!) 121  03/20/18 (!) 125  10/17/13 74   BMI Readings from Last 3 Encounters:  03/22/18 27.06 kg/m  10/17/13 28.11 kg/m  11/18/12 27.12 kg/m    Patient Care Team  Relationship Specialty Notifications Start End  Mellody Dance, DO PCP - General Family Medicine  03/22/18   Morrison Old, NP Nurse Practitioner Nurse Practitioner  03/22/18     Patient Active Problem List   Diagnosis Date Noted  . Hypertension 03/22/2018    Priority: High  . Hyperlipidemia 03/22/2002    Priority: High  . GERD (gastroesophageal reflux disease) 03/22/2018  . Acute bilateral low back pain with left-sided sciatica 03/22/2018  . Muscle spasm of back 03/22/2018  . Lumbar back pain 04/29/2017  . Lumbar herniated disc 03/22/2014  . BPH (benign prostatic hyperplasia) 03/03/2012  . Family history of ischemic heart disease (IHD) 08/10/2011  . Colon adenoma 06/11/2011  . Vitamin D deficiency 06/11/2011  . ED (erectile dysfunction) 06/02/2011       As reported by pt:  Past Medical History:  Diagnosis Date  . Hyperlipidemia   . Hypertension   . Insomnia   . Lumbar herniated disc      Past Surgical History:  Procedure Laterality Date  . COLONOSCOPY W/ POLYPECTOMY       Family History  Problem Relation Age of Onset  . Heart attack Father   . Heart Problems Father   . Hypertension Father   . Hyperlipidemia Father   . Colon cancer Neg Hx   . Pancreatic cancer Neg Hx   . Stomach cancer Neg Hx      Social History   Substance and Sexual Activity  Drug Use No     Social History   Substance and Sexual Activity  Alcohol Use Yes  . Alcohol/week: 1.0 standard drinks  . Types: 1 Standard drinks or equivalent per week   Comment: occasionally     Social History   Tobacco Use  Smoking Status Never Smoker  Smokeless Tobacco Never Used     Current Meds  Medication Sig  . aspirin 81 MG tablet Take 81  mg by mouth daily.  Marland Kitchen atorvastatin (LIPITOR) 20 MG tablet Take 20 mg by mouth daily.  Marland Kitchen HYDROcodone-acetaminophen (NORCO) 5-325 MG tablet Take 1 tablet by mouth every 6 (six) hours as needed for moderate pain.  Marland Kitchen losartan-hydrochlorothiazide (HYZAAR) 50-12.5 MG tablet Take 1 tablet by mouth daily.   Marland Kitchen omeprazole (PRILOSEC) 20 MG capsule Take 1 capsule by mouth daily.   . predniSONE (DELTASONE) 50 MG tablet One daily with food    Allergies: Patient has no known allergies.   Review of Systems  Constitutional: Negative for chills, diaphoresis, fever, malaise/fatigue and weight loss.  HENT: Negative for congestion, sore throat and tinnitus.   Eyes: Negative for blurred vision, double vision and photophobia.  Respiratory: Negative for cough and wheezing.   Cardiovascular: Negative for chest pain and palpitations.  Gastrointestinal: Negative for blood in stool, diarrhea, nausea and vomiting.  Genitourinary: Negative for dysuria, frequency and urgency.  Musculoskeletal: Negative for joint pain and myalgias.  Skin: Negative for itching and rash.  Neurological: Negative for dizziness, focal weakness, weakness and headaches.  Endo/Heme/Allergies: Negative for environmental allergies and polydipsia. Does not bruise/bleed easily.  Psychiatric/Behavioral: Negative for depression and memory loss. The patient has insomnia (chronic). The patient is not nervous/anxious.         Objective:   Blood pressure 122/88, pulse (!) 121, temperature 98 F (36.7 C), height 6' 0.5" (1.842 m), weight 202 lb 4.8 oz (91.8 kg), SpO2 97 %. Body mass index is 27.06 kg/m. General: Well Developed, well nourished, and in no acute distress.  Neuro: Alert and oriented x3, extra-ocular  muscles intact, sensation grossly intact.  HEENT:[place]/AT, PERRLA, neck supple, No carotid bruits Skin: no gross rashes  Cardiac: Regular rate and rhythm Respiratory: Essentially clear to auscultation bilaterally. Not using accessory  muscles, speaking in full sentences.  Abdominal: not grossly distended Musculoskeletal: Ambulates w/o diff, FROM * 4 ext-strength equal bilaterally.  Hamstrings tight on left versus right.  Straight leg raise negative bilaterally.  NVI distally; Reflexes L4, S1 2/4 on R and WNL's;  diminished L4, S1 on L L Ext Back:  TTP L paravert muscles T11-L4, no boney TTP over spinous process. Vasc: less 2 sec cap RF, warm and pink  Psych:  No HI/SI, judgement and insight good, Euthymic mood. Full Affect.      No results found for this or any previous visit (from the past 2160 hour(s)).

## 2018-03-23 ENCOUNTER — Encounter: Payer: Self-pay | Admitting: Family Medicine

## 2018-03-23 ENCOUNTER — Other Ambulatory Visit: Payer: Self-pay

## 2018-03-23 DIAGNOSIS — M545 Low back pain, unspecified: Secondary | ICD-10-CM

## 2018-03-23 DIAGNOSIS — M5126 Other intervertebral disc displacement, lumbar region: Secondary | ICD-10-CM

## 2018-03-23 NOTE — Progress Notes (Signed)
Per MyChart messages, pt requests referral to neurology.  Referral placed.  Charyl Bigger, CMA

## 2018-03-25 ENCOUNTER — Other Ambulatory Visit: Payer: Self-pay

## 2018-03-25 ENCOUNTER — Telehealth: Payer: Self-pay | Admitting: Family Medicine

## 2018-03-25 DIAGNOSIS — M545 Low back pain, unspecified: Secondary | ICD-10-CM

## 2018-03-25 MED ORDER — PREDNISONE 20 MG PO TABS: ORAL_TABLET | ORAL | 0 refills | Status: DC

## 2018-03-25 NOTE — Telephone Encounter (Signed)
Patient is requesting a refill of his prednisone for ongoing back pain (patient states that the last 24 hrs has been bad). If approved please send to [facility].

## 2018-03-25 NOTE — Telephone Encounter (Signed)
Sent and patient notified. MPulliam, CMA/RT(R)

## 2018-03-28 ENCOUNTER — Other Ambulatory Visit: Payer: Self-pay

## 2018-03-28 ENCOUNTER — Encounter: Payer: Self-pay | Admitting: Physical Therapy

## 2018-03-28 ENCOUNTER — Ambulatory Visit: Payer: BLUE CROSS/BLUE SHIELD | Attending: Family Medicine | Admitting: Physical Therapy

## 2018-03-28 DIAGNOSIS — M5416 Radiculopathy, lumbar region: Secondary | ICD-10-CM | POA: Insufficient documentation

## 2018-03-28 DIAGNOSIS — R2689 Other abnormalities of gait and mobility: Secondary | ICD-10-CM | POA: Diagnosis not present

## 2018-03-28 DIAGNOSIS — M5441 Lumbago with sciatica, right side: Secondary | ICD-10-CM

## 2018-03-28 DIAGNOSIS — M62830 Muscle spasm of back: Secondary | ICD-10-CM | POA: Insufficient documentation

## 2018-03-29 ENCOUNTER — Encounter: Payer: Self-pay | Admitting: Physical Therapy

## 2018-03-29 ENCOUNTER — Ambulatory Visit: Payer: BLUE CROSS/BLUE SHIELD | Admitting: Physical Therapy

## 2018-03-29 DIAGNOSIS — M62830 Muscle spasm of back: Secondary | ICD-10-CM | POA: Diagnosis not present

## 2018-03-29 DIAGNOSIS — R2689 Other abnormalities of gait and mobility: Secondary | ICD-10-CM

## 2018-03-29 DIAGNOSIS — M5441 Lumbago with sciatica, right side: Secondary | ICD-10-CM

## 2018-03-29 DIAGNOSIS — M5416 Radiculopathy, lumbar region: Secondary | ICD-10-CM

## 2018-03-29 NOTE — Patient Instructions (Signed)

## 2018-03-29 NOTE — Therapy (Signed)
[place], [place], [address] Phone: [phone]   Fax:  [phone]  Physical Therapy Treatment  Patient Details  Name: Mason Vargas MRN: [id number] Date of Birth: 12/11/1955 Referring Provider (PT): Mellody Dance DO    Encounter Date: 03/29/2018  PT End of Session - 03/29/18 1519    Visit Number  2    Number of Visits  12    Date for PT Re-Evaluation  05/09/18    Authorization Type  BCBS     PT Start Time  1015    PT Stop Time  1112    PT Time Calculation (min)  57 min    Activity Tolerance  Patient tolerated treatment well    Behavior During Therapy  [facility] for tasks assessed/performed       Past Medical History:  Diagnosis Date  . Hyperlipidemia   . Hypertension   . Insomnia   . Lumbar herniated disc     Past Surgical History:  Procedure Laterality Date  . COLONOSCOPY W/ POLYPECTOMY      There were no vitals filed for this visit.  Subjective Assessment - 03/29/18 1517    Subjective  Patient reports that after yesterday his back felt petty good for a while. The ptient reports around 2:00 AM the pain began and began getting much worse. The pain continued this morning. he conmes into the clinic with significant symptoms.     Limitations  Sitting;Lifting;Standing;Walking    How long can you sit comfortably?  < 5 min before he needs to shift     How long can you stand comfortably?  < 5 minutes beofre he feels pressure in his back     How long can you walk comfortably?  limited distance using a cane     Diagnostic tests  Nothing at this time     Patient Stated Goals  to have less pain     Currently in Pain?  Yes    Pain Score  8     Pain Location  Back    Pain Orientation  Right;Left    Pain Descriptors / Indicators  Aching    Pain Type  Acute pain    Pain Onset  More than a month ago    Pain Frequency  Constant    Aggravating Factors   sleeping, standing, walking     Pain Relieving  Factors  rest    Effect of Pain on Daily Activities   difficulty walking                        [facility] Adult PT Treatment/Exercise - 03/29/18 1442      Lumbar Exercises: Seated   Other Seated Lumbar Exercises  abdominal breathing x5; seated postrure x5      Lumbar Exercises: Supine   Other Supine Lumbar Exercises  decompression poistion reviewed       Lumbar Exercises: Prone   Other Prone Lumbar Exercises  Reviewed prone on pillows for 2-3 minutes pain began to increase       Manual Therapy   Manual Therapy  Joint mobilization;Soft tissue mobilization;Myofascial release    Manual therapy comments  LAD of the right leg increased pain; at the knee with the left improved pain.     Joint Mobilization  Corrected lateral shift. Able to tolerate but unable to maintain standing for long enough; side lying lateral glides and lateral rocking; Grade 5 Lower lumbar manipulation attmepted  3x without caviitation;     Soft tissue mobilization  sidelying to lumbar spine with focus on the left    Myofascial Release  to lumbar spine              PT Education - 03/29/18 1518    Education Details  decmoression psition., reviewed the anatomy of mckenzie extension     Person(s) Educated  Patient    Methods  Explanation;Demonstration;Tactile cues;Verbal cues    Comprehension  Verbalized understanding;Returned demonstration;Verbal cues required;Tactile cues required;Need further instruction       PT Short Term Goals - 03/29/18 1522      PT SHORT TERM GOAL #1   Title  Patient will report cerntralizsed lower back pain > 3/10.     Time  3    Period  Weeks    Status  On-going      PT SHORT TERM GOAL #2   Title  Patient will increase gross left hip strength to 3+/5    Time  3    Period  Weeks    Status  On-going    Target Date  04/18/18      PT SHORT TERM GOAL #3   Title  Patient ambualte 300' without increased pain with LRAD     Time  3    Period  Weeks    Status  On-going         PT Long Term Goals - 03/28/18 1315      PT LONG TERM GOAL #1   Title  Patient will sleep through the night without pain     Time  6    Period  Weeks    Status  New    Target Date  05/09/18      PT LONG TERM GOAL #2   Title  Patient will sit for 2 hours without self report of pain in order to perfrom work tasks    Time  6    Period  Weeks    Status  New    Target Date  05/09/18      PT LONG TERM GOAL #3   Title  Patient will demonstrate a 35% limitation on FOTO     Time  6    Period  Weeks    Status  New    Target Date  05/09/18            Plan - 03/29/18 1519    Clinical Impression Statement  Patient had best centralzation with manipulation.  He also had some improved symptoms with LAD from the knee on the left. He could not tolerate LAD on the right. He also did not tolerate prone on pillows well. Therapy attempted to correct his lateral shift but hewas unable to stand long enough. Therapy talked to him about abdominal breathing andgave him the decmpression position to go home with.     Examination-Activity Limitations  Bed Mobility;Squat;Stairs;Caring for Others;Transfers;Sit;Sleep;Carry    Examination-Participation Restrictions  Cleaning;Laundry;Church;Shop;Yard Work;Personal Finances    Stability/Clinical Decision Making  Unstable/Unpredictable    Clinical Decision Making  High    Rehab Potential  Good    PT Frequency  2x / week    PT Duration  6 weeks    PT Treatment/Interventions  ADLs/Self Care Home Management;Cryotherapy;Electrical Stimulation;Iontophoresis 4mg /ml Dexamethasone;Moist Heat;Ultrasound;DME Instruction;Gait training;Functional mobility training;Cognitive remediation;Neuromuscular re-education;Therapeutic activities;Therapeutic exercise;Patient/family education;Manual techniques;Passive range of motion;Taping;Joint Manipulations;Spinal Manipulations    PT Next Visit Plan  correct lateral shift; see if we can get patient progen., light  LAD in  close packed position if able; soft tissue work wot lower back;     PT Home Exercise Plan  tennis ball trigger point release; work on Network engineer and Agree with Plan of Care  Patient       Patient will benefit from skilled therapeutic intervention in order to improve the following deficits and impairments:  Abnormal gait, Pain, Increased fascial restricitons, Improper body mechanics, Postural dysfunction, Decreased activity tolerance, Decreased endurance, Decreased range of motion, Decreased strength  Visit Diagnosis: Radiculopathy, lumbar region  Acute left-sided low back pain with right-sided sciatica  Muscle spasm of back  Other abnormalities of gait and mobility     Problem List Patient Active Problem List   Diagnosis Date Noted  . GERD (gastroesophageal reflux disease) 03/22/2018  . Hypertension 03/22/2018  . Acute bilateral low back pain with left-sided sciatica 03/22/2018  . Muscle spasm of back 03/22/2018  . Lumbar back pain 04/29/2017  . Lumbar herniated disc 03/22/2014  . BPH (benign prostatic hyperplasia) 03/03/2012  . Family history of ischemic heart disease (IHD) 08/10/2011  . Colon adenoma 06/11/2011  . Vitamin D deficiency 06/11/2011  . ED (erectile dysfunction) 06/02/2011  . Hyperlipidemia 03/22/2002    Carney Living PT DPT  03/29/2018, 3:25 PM  [facility] [address] [place], [place], [address] Phone: [phone]   Fax:  [phone]  Name: RICHMOND COLDREN MRN: [id number] Date of Birth: [birth year]

## 2018-03-29 NOTE — Addendum Note (Signed)
Addended by: Carney Living on: 03/29/2018 08:19 AM   Modules accepted: Orders

## 2018-03-29 NOTE — Therapy (Signed)
[place], [place], [address] Phone: [phone]   Fax:  [phone]  Physical Therapy Evaluation  Patient Details  Name: Mason Vargas MRN: [id number] Date of Birth: 01-27-1955 Referring Provider (PT): Mellody Dance DO    Encounter Date: 03/28/2018  PT End of Session - 03/28/18 0916    Visit Number  1    Number of Visits  12    Date for PT Re-Evaluation  05/09/18    Authorization Type  BCBS     PT Start Time  0800    PT Stop Time  0856    PT Time Calculation (min)  56 min    Activity Tolerance  Patient tolerated treatment well    Behavior During Therapy  [facility] for tasks assessed/performed       Past Medical History:  Diagnosis Date  . Hyperlipidemia   . Hypertension   . Insomnia   . Lumbar herniated disc     Past Surgical History:  Procedure Laterality Date  . COLONOSCOPY W/ POLYPECTOMY      There were no vitals filed for this visit.   Subjective Assessment - 03/28/18 0809    Subjective  Patient jumped off a log a week ago and had significant lower back pain that is radiating down into his anterior thigh and into his left lateral leg. He hashad a herniated disc since he was 22.     Limitations  Sitting;Lifting;Standing;Walking    How long can you sit comfortably?  < 5 min before he needs to shift     How long can you stand comfortably?  < 5 minutes beofre he feels pressure in his back     How long can you walk comfortably?  limited distance using a cane     Diagnostic tests  Nothing at this time     Patient Stated Goals  to have less pain     Currently in Pain?  Yes    Pain Score  7    was up to a 9/10 last night    Pain Location  Back    Pain Orientation  Lower    Pain Descriptors / Indicators  Aching    Pain Type  Acute pain    Pain Onset  Today    Pain Frequency  Constant    Aggravating Factors   Sleeping Standing walking     Pain Relieving Factors  rest     Effect of Pain on  Daily Activities  difficulty walking         [facility] PT Assessment - 03/29/18 0001      Assessment   Medical Diagnosis  Left Lower back pain with Radiculopathy    Referring Provider (PT)  Mellody Dance DO     Onset Date/Surgical Date  03/21/18    Next MD Visit  Nothing Scheudled     Prior Therapy  No PT. Has been to a Chiropracter       Precautions   Precautions  None      Restrictions   Weight Bearing Restrictions  No      Balance Screen   Has the patient fallen in the past 6 months  No    Has the patient had a decrease in activity level because of a fear of falling?   No    Is the patient reluctant to leave their home because of a fear of falling?   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     Leisure  cutting wood; yard work       Charity fundraiser Status  Within [employer] for tasks assessed    Attention  Focused    Focused Attention  Appears intact    Memory  Appears intact    Awareness  Appears intact    Problem Solving  Appears intact    Executive Function  Reasoning      Observation/Other Assessments   Observations  Sits in hischair shifted to the right     Focus on Therapeutic Outcomes (FOTO)   60% limitation       Sensation   Light Touch  Appears Intact    Additional Comments  numbness in the lateral calf; pain into the anterior thigh.        Coordination   Gross Motor Movements are Fluid and Coordinated  Yes    Fine Motor Movements are Fluid and Coordinated  Yes      Posture/Postural Control   Posture/Postural Control  Postural limitations    Postural Limitations  Forward head      AROM   Lumbar Flexion  32 degresswith increased radiuclar pina     Lumbar Extension  10     Lumbar - Right Side Bend  unable to test     Lumbar - Left Side Bend  unable to test     Lumbar -  Right Rotation  unable to test     Lumbar - Left Rotation  unable to test       PROM   Overall PROM Comments  limited testing 2nd to significant pain in supine; Pain with all movements of the left hip      Strength   Right Hip Flexion  5/5    Right Hip ABduction  5/5    Right Hip ADduction  5/5    Left Hip Flexion  2/5    Left Hip ABduction  2/5    Left Hip ADduction  2/5      Flexibility   Soft Tissue Assessment /Muscle Length  yes    Hamstrings  unable to assess 2nd to significant irritability of symptoms       Palpation   Spinal mobility  To reactive for PA glides from L2-L5      Transfers   Comments  slow to suand uses both hands and right leg       Ambulation/Gait   Ambulation/Gait  Yes    Gait Comments  Puts cane out in front of himself leans forward;                 Objective measurements completed on examination: See above findings.      [facility] PT Treatment/Exercise - 03/29/18 0001      Exercises   Exercises  Lumbar      Lumbar Exercises: Stretches   Other Lumbar Stretch Exercise  seated posture       Lumbar Exercises: Standing   Other Standing Lumbar Exercises  tennis ball trigger point release              PT Education - 03/28/18 1255    Education Details  HEP, symptom mangement; cetralization     Person(s) Educated  Patient    Methods  Explanation;Tactile cues;Verbal cues;Handout;Demonstration  Comprehension  Verbalized understanding;Returned demonstration;Verbal cues required;Tactile cues required       PT Short Term Goals - 03/28/18 1313      PT SHORT TERM GOAL #1   Title  Patient will report cerntralizsed lower back pain > 3/10.     Time  3    Period  Weeks    Status  New    Target Date  04/18/18      PT SHORT TERM GOAL #2   Title  Patient will increase gross left hip strength to 3+/5    Time  3    Period  Weeks    Status  New    Target Date  04/18/18      PT SHORT TERM GOAL #3   Title  Patient ambualte 300'  without increased pain with LRAD     Time  3    Period  Weeks    Status  New    Target Date  04/18/18        PT Long Term Goals - 03/28/18 1315      PT LONG TERM GOAL #1   Title  Patient will sleep through the night without pain     Time  6    Period  Weeks    Status  New    Target Date  05/09/18      PT LONG TERM GOAL #2   Title  Patient will sit for 2 hours without self report of pain in order to perfrom work tasks    Time  6    Period  Weeks    Status  New    Target Date  05/09/18      PT LONG TERM GOAL #3   Title  Patient will demonstrate a 35% limitation on FOTO     Time  6    Period  Weeks    Status  New    Target Date  05/09/18             Plan - 03/28/18 1026    Clinical Impression Statement  Patient is a 63 year old male with left sided lower back pain and radicualr pain. He has signifcant irratibulity of symptoms at this time. Therapy attempted to cerntraliza his sympotms in several positions but he was unable to maintain positioning in any position. Patinet was given light soft tissue mobilization with a tennis ball and advised him to pwokr on improving his posture. He has a signifcinat lateral shift to the right. He will require a lateral shift correction      Examination-Activity Limitations  Bed Mobility;Squat;Stairs;Caring for Others;Transfers;Sit;Sleep;Carry    Examination-Participation Restrictions  Cleaning;Laundry;Church;Shop;Yard Work;Personal Finances    Stability/Clinical Decision Making  Unstable/Unpredictable    Rehab Potential  Good    PT Frequency  2x / week    PT Duration  6 weeks    PT Treatment/Interventions  ADLs/Self Care Home Management;Cryotherapy;Electrical Stimulation;Iontophoresis 4mg /ml Dexamethasone;Moist Heat;Ultrasound;DME Instruction;Gait training;Functional mobility training;Cognitive remediation;Neuromuscular re-education;Therapeutic activities;Therapeutic exercise;Patient/family education;Manual techniques;Passive range of  motion;Taping;Joint Manipulations;Spinal Manipulations    PT Next Visit Plan  correct lateral shift; see if we can get patient progen., light LAD in close packed position if able; soft tissue work wot lower back;     PT Home Exercise Plan  tennis ball trigger point release; work on Network engineer and Agree with Plan of Care  Patient       Patient will benefit from skilled therapeutic intervention in order to improve the  following deficits and impairments:  Abnormal gait, Pain, Increased fascial restricitons, Improper body mechanics, Postural dysfunction, Decreased activity tolerance, Decreased endurance, Decreased range of motion, Decreased strength  Visit Diagnosis: Radiculopathy, lumbar region  Acute left-sided low back pain with right-sided sciatica  Muscle spasm of back  Other abnormalities of gait and mobility     Problem List Patient Active Problem List   Diagnosis Date Noted  . GERD (gastroesophageal reflux disease) 03/22/2018  . Hypertension 03/22/2018  . Acute bilateral low back pain with left-sided sciatica 03/22/2018  . Muscle spasm of back 03/22/2018  . Lumbar back pain 04/29/2017  . Lumbar herniated disc 03/22/2014  . BPH (benign prostatic hyperplasia) 03/03/2012  . Family history of ischemic heart disease (IHD) 08/10/2011  . Colon adenoma 06/11/2011  . Vitamin D deficiency 06/11/2011  . ED (erectile dysfunction) 06/02/2011  . Hyperlipidemia 03/22/2002    Carney Living PT DPT  03/29/2018, 8:16 AM  [facility] [address] [place], [place], [address] Phone: [phone]   Fax:  [phone]  Name: BELVIN GAUSS MRN: [id number] Date of Birth: 04/07/1955

## 2018-03-31 ENCOUNTER — Other Ambulatory Visit: Payer: Self-pay

## 2018-03-31 ENCOUNTER — Encounter: Payer: Self-pay | Admitting: Physical Therapy

## 2018-03-31 ENCOUNTER — Ambulatory Visit: Payer: BLUE CROSS/BLUE SHIELD | Admitting: Physical Therapy

## 2018-03-31 DIAGNOSIS — M62830 Muscle spasm of back: Secondary | ICD-10-CM | POA: Diagnosis not present

## 2018-03-31 DIAGNOSIS — R2689 Other abnormalities of gait and mobility: Secondary | ICD-10-CM

## 2018-03-31 DIAGNOSIS — M5416 Radiculopathy, lumbar region: Secondary | ICD-10-CM | POA: Diagnosis not present

## 2018-03-31 DIAGNOSIS — M5441 Lumbago with sciatica, right side: Secondary | ICD-10-CM | POA: Diagnosis not present

## 2018-03-31 NOTE — Therapy (Signed)
[place], [place], [address] Phone: [phone]   Fax:  [phone]  Physical Therapy Treatment  Patient Details  Name: Mason Vargas MRN: [id number] Date of Birth: 1955/07/20 Referring Provider (PT): Mellody Dance DO    Encounter Date: 03/31/2018  PT End of Session - 03/31/18 1420    Visit Number  3    Number of Visits  12    Date for PT Re-Evaluation  05/09/18    Authorization Type  BCBS     PT Start Time  0800    PT Stop Time  0856    PT Time Calculation (min)  56 min    Activity Tolerance  Patient tolerated treatment well    Behavior During Therapy  [facility] for tasks assessed/performed       Past Medical History:  Diagnosis Date  . Hyperlipidemia   . Hypertension   . Insomnia   . Lumbar herniated disc     Past Surgical History:  Procedure Laterality Date  . COLONOSCOPY W/ POLYPECTOMY      There were no vitals filed for this visit.  Subjective Assessment - 03/31/18 0955    Subjective  Patient reports he is sleeping better and walking further. He is still having significant pain. He ordered a TENS unit. He has been wporking on staying more uprtight and working on some of the exercises.     Limitations  Sitting;Lifting;Standing;Walking    How long can you sit comfortably?  < 5 min before he needs to shift     How long can you stand comfortably?  < 5 minutes beofre he feels pressure in his back     How long can you walk comfortably?  limited distance using a cane     Currently in Pain?  Yes    Pain Score  6     Pain Location  Back    Pain Orientation  Right    Pain Descriptors / Indicators  Aching    Pain Type  Acute pain    Pain Onset  More than a month ago    Pain Frequency  Constant    Aggravating Factors   sleeping, standing, walking     Pain Relieving Factors  rest                        [facility] Adult PT Treatment/Exercise - 03/31/18 0001      Lumbar Exercises:  Aerobic   UBE (Upper Arm Bike)  1 min intervals 5x 20 sec rest      Lumbar Exercises: Seated   Other Seated Lumbar Exercises  abdominal breathing x5; seated postrure x5      Lumbar Exercises: Supine   Other Supine Lumbar Exercises  decompression poistion reviewed       Lumbar Exercises: Prone   Other Prone Lumbar Exercises  Reviewed prone on pillows for 2-3 minutes pain began to increase       Manual Therapy   Manual Therapy  Joint mobilization;Soft tissue mobilization;Myofascial release    Manual therapy comments  LAD of the right leg continued increase in painincreased pain; at the knee with the left improved pain.     Joint Mobilization  Corrected lateral shift. Able to tolerate better today. decreased shift noted standing for long enough; side lying lateral glides and lateral rocking; Grade 5 Lower lumbar manipulation attmepted 3x without caviitation;     Soft tissue mobilization  sidelying to lumbar spine  with focus on the left    Myofascial Release  to lumbar spine              PT Education - 03/31/18 0957    Education Details  reviewed HEP, Symptom mangement     Person(s) Educated  Patient    Methods  Explanation;Tactile cues;Verbal cues;Demonstration    Comprehension  Returned demonstration;Verbalized understanding;Verbal cues required;Tactile cues required;Need further instruction       PT Short Term Goals - 03/31/18 1517      PT SHORT TERM GOAL #1   Title  Patient will report cerntralizsed lower back pain > 3/10.     Time  3    Period  Weeks    Status  On-going    Target Date  04/18/18      PT SHORT TERM GOAL #2   Title  Patient will increase gross left hip strength to 3+/5    Time  3    Period  Weeks    Status  On-going    Target Date  04/18/18      PT SHORT TERM GOAL #3   Title  Patient ambualte 300' without increased pain with LRAD     Time  3    Period  Weeks    Status  On-going    Target Date  04/18/18        PT Long Term Goals - 03/28/18  1315      PT LONG TERM GOAL #1   Title  Patient will sleep through the night without pain     Time  6    Period  Weeks    Status  New    Target Date  05/09/18      PT LONG TERM GOAL #2   Title  Patient will sit for 2 hours without self report of pain in order to perfrom work tasks    Time  6    Period  Weeks    Status  New    Target Date  05/09/18      PT LONG TERM GOAL #3   Title  Patient will demonstrate a 35% limitation on FOTO     Time  6    Period  Weeks    Status  New    Target Date  05/09/18            Plan - 03/31/18 1423    Clinical Impression Statement  Patient had a better tolerance to standing today and appeared to have less of a lateral shift. He did not tolerate LAD as well though. He did report improved radicular symptoms with LAD.  Although he is improving he was advised it may be beneficial to see a neuro-surgeon before in case things get shut down.     Examination-Activity Limitations  Bed Mobility;Squat;Stairs;Caring for Others;Transfers;Sit;Sleep;Carry    Examination-Participation Restrictions  Cleaning;Laundry;Church;Shop;Yard Work;Personal Finances    Stability/Clinical Decision Making  Unstable/Unpredictable    Clinical Decision Making  High    Rehab Potential  Good    PT Frequency  2x / week    PT Duration  6 weeks    PT Treatment/Interventions  ADLs/Self Care Home Management;Cryotherapy;Electrical Stimulation;Iontophoresis 4mg /ml Dexamethasone;Moist Heat;Ultrasound;DME Instruction;Gait training;Functional mobility training;Cognitive remediation;Neuromuscular re-education;Therapeutic activities;Therapeutic exercise;Patient/family education;Manual techniques;Passive range of motion;Taping;Joint Manipulations;Spinal Manipulations    PT Next Visit Plan  correct lateral shift; see if we can get patient progen., light LAD in close packed position if able; soft tissue work wot lower back;  PT Home Exercise Plan  tennis ball trigger point release; work on  posture     Consulted and Agree with Plan of Care  Patient       Patient will benefit from skilled therapeutic intervention in order to improve the following deficits and impairments:  Abnormal gait, Pain, Increased fascial restricitons, Improper body mechanics, Postural dysfunction, Decreased activity tolerance, Decreased endurance, Decreased range of motion, Decreased strength  Visit Diagnosis: Radiculopathy, lumbar region  Acute left-sided low back pain with right-sided sciatica  Muscle spasm of back  Other abnormalities of gait and mobility     Problem List Patient Active Problem List   Diagnosis Date Noted  . GERD (gastroesophageal reflux disease) 03/22/2018  . Hypertension 03/22/2018  . Acute bilateral low back pain with left-sided sciatica 03/22/2018  . Muscle spasm of back 03/22/2018  . Lumbar back pain 04/29/2017  . Lumbar herniated disc 03/22/2014  . BPH (benign prostatic hyperplasia) 03/03/2012  . Family history of ischemic heart disease (IHD) 08/10/2011  . Colon adenoma 06/11/2011  . Vitamin D deficiency 06/11/2011  . ED (erectile dysfunction) 06/02/2011  . Hyperlipidemia 03/22/2002    Carney Living PT DPT  03/31/2018, 3:20 PM  [facility] [address] [place], [place], [address] Phone: [phone]   Fax:  [phone]  Name: MACAULEY MOSSBERG MRN: [id number] Date of Birth: 02/16/1955

## 2018-04-01 ENCOUNTER — Encounter: Payer: Self-pay | Admitting: Family Medicine

## 2018-04-04 ENCOUNTER — Ambulatory Visit: Payer: BLUE CROSS/BLUE SHIELD | Admitting: Physical Therapy

## 2018-04-04 ENCOUNTER — Other Ambulatory Visit: Payer: Self-pay

## 2018-04-04 ENCOUNTER — Encounter: Payer: Self-pay | Admitting: Physical Therapy

## 2018-04-04 DIAGNOSIS — M5416 Radiculopathy, lumbar region: Secondary | ICD-10-CM

## 2018-04-04 DIAGNOSIS — M62830 Muscle spasm of back: Secondary | ICD-10-CM

## 2018-04-04 DIAGNOSIS — M5441 Lumbago with sciatica, right side: Secondary | ICD-10-CM

## 2018-04-04 DIAGNOSIS — R2689 Other abnormalities of gait and mobility: Secondary | ICD-10-CM

## 2018-04-04 NOTE — Therapy (Signed)
[place], [place], [address] Phone: [phone]   Fax:  [phone]  Physical Therapy Treatment  Patient Details  Name: Mason Vargas MRN: [id number] Date of Birth: [birth year] Referring Provider (PT): Mellody Dance DO    Encounter Date: 04/04/2018  PT End of Session - 04/04/18 1151    Visit Number  4    Number of Visits  12    Date for PT Re-Evaluation  05/09/18    Authorization Type  BCBS     PT Start Time  1145    PT Stop Time  1230    PT Time Calculation (min)  45 min    Activity Tolerance  Patient tolerated treatment well    Behavior During Therapy  [facility] for tasks assessed/performed       Past Medical History:  Diagnosis Date  . Hyperlipidemia   . Hypertension   . Insomnia   . Lumbar herniated disc     Past Surgical History:  Procedure Laterality Date  . COLONOSCOPY W/ POLYPECTOMY      There were no vitals filed for this visit.  Subjective Assessment - 04/04/18 1148    Subjective  Patient reports the pain is improving. He had a good day yesterday. he is sleeping until about 4 in the morning now. When he walks he is getting pain into his buttocks. He has been using his tens uinti at home.     Limitations  Sitting;Lifting;Standing;Walking    How long can you sit comfortably?  < 5 min before he needs to shift     How long can you stand comfortably?  < 5 minutes beofre he feels pressure in his back     How long can you walk comfortably?  limited distance using a cane     Diagnostic tests  Nothing at this time     Patient Stated Goals  to have less pain     Currently in Pain?  Yes    Pain Score  5     Pain Location  Back    Pain Orientation  Left    Pain Descriptors / Indicators  Aching    Pain Type  Acute pain    Pain Onset  More than a month ago    Aggravating Factors   sleeping and standing     Pain Relieving Factors  rest     Effect of Pain on Daily Activities  difficulty walking                         [facility] Adult PT Treatment/Exercise - 04/04/18 0001      Lumbar Exercises: Stretches   Lower Trunk Rotation Limitations  x10    Piriformis Stretch Limitations  3x20 sec hold       Lumbar Exercises: Aerobic   UBE (Upper Arm Bike)  1 min interavls 5x 30 sec rest       Lumbar Exercises: Supine   AB Set Limitations  on wedge 3x for proper breathing     Clam Limitations  red x10     Other Supine Lumbar Exercises  ball squeeze x10       Modalities   Modalities  Electrical Stimulation;Cryotherapy      Cryotherapy   Number Minutes Cryotherapy  10 Minutes    Cryotherapy Location  Hip    Type of Cryotherapy  Ice pack      Electrical Stimulation   Electrical Stimulation Location  to lumbar spine     Electrical Stimulation Action  IFC     Electrical Stimulation Parameters  to tolerance     Electrical Stimulation Goals  Pain      Manual Therapy   Manual Therapy  Joint mobilization;Soft tissue mobilization;Myofascial release    Manual therapy comments  LAD of the right leg continued increase in painincreased pain; at the knee with the left improved pain.     Joint Mobilization  Corrected lateral shift. Able to tolerate better today. decreased shift noted standing for long enough; side lying lateral glides and lateral rocking; Grade 5 Lower lumbar manipulation attmepted 3x without caviitation;     Soft tissue mobilization  sidelying to lumbar spine with focus on the left    Myofascial Release  to lumbar spine              PT Education - 04/04/18 1150    Education Details  reviewed HEP and symptom mangement     Person(s) Educated  Patient    Methods  Explanation;Demonstration;Verbal cues;Tactile cues    Comprehension  Verbalized understanding;Returned demonstration;Verbal cues required;Tactile cues required       PT Short Term Goals - 03/31/18 1517      PT SHORT TERM GOAL #1   Title  Patient will report cerntralizsed lower back pain > 3/10.      Time  3    Period  Weeks    Status  On-going    Target Date  04/18/18      PT SHORT TERM GOAL #2   Title  Patient will increase gross left hip strength to 3+/5    Time  3    Period  Weeks    Status  On-going    Target Date  04/18/18      PT SHORT TERM GOAL #3   Title  Patient ambualte 300' without increased pain with LRAD     Time  3    Period  Weeks    Status  On-going    Target Date  04/18/18        PT Long Term Goals - 03/28/18 1315      PT LONG TERM GOAL #1   Title  Patient will sleep through the night without pain     Time  6    Period  Weeks    Status  New    Target Date  05/09/18      PT LONG TERM GOAL #2   Title  Patient will sit for 2 hours without self report of pain in order to perfrom work tasks    Time  6    Period  Weeks    Status  New    Target Date  05/09/18      PT LONG TERM GOAL #3   Title  Patient will demonstrate a 35% limitation on FOTO     Time  6    Period  Weeks    Status  New    Target Date  05/09/18              Patient will benefit from skilled therapeutic intervention in order to improve the following deficits and impairments:     Visit Diagnosis: Radiculopathy, lumbar region  Acute left-sided low back pain with right-sided sciatica  Muscle spasm of back  Other abnormalities of gait and mobility     Problem List Patient Active Problem List   Diagnosis Date Noted  . GERD (gastroesophageal reflux disease) 03/22/2018  .  Hypertension 03/22/2018  . Acute bilateral low back pain with left-sided sciatica 03/22/2018  . Muscle spasm of back 03/22/2018  . Lumbar back pain 04/29/2017  . Lumbar herniated disc 03/22/2014  . BPH (benign prostatic hyperplasia) 03/03/2012  . Family history of ischemic heart disease (IHD) 08/10/2011  . Colon adenoma 06/11/2011  . Vitamin D deficiency 06/11/2011  . ED (erectile dysfunction) 06/02/2011  . Hyperlipidemia 03/22/2002    Carney Living PT DPT  04/04/2018, 1:12 PM  [facility] [address] [place], [place], [address] Phone: [phone]   Fax:  [phone]  Name: Mason Vargas MRN: [id number] Date of Birth: 1955-05-16

## 2018-04-05 ENCOUNTER — Other Ambulatory Visit: Payer: Self-pay | Admitting: Family Medicine

## 2018-04-05 ENCOUNTER — Telehealth: Payer: Self-pay | Admitting: Diagnostic Neuroimaging

## 2018-04-05 DIAGNOSIS — M62830 Muscle spasm of back: Secondary | ICD-10-CM

## 2018-04-05 DIAGNOSIS — M5442 Lumbago with sciatica, left side: Secondary | ICD-10-CM

## 2018-04-05 NOTE — Telephone Encounter (Signed)
Patient will be switched to Dr Jannifer Franklin per Lexine Baton, new patient coordinator. She will call him.

## 2018-04-05 NOTE — Telephone Encounter (Signed)
LOV 03/22/2018 and has appointment with Neurology on 05/27/2018.  Please review and advise. MPulliam, CMA/RT(R)

## 2018-04-05 NOTE — Telephone Encounter (Signed)
Pt called wanting to switch providers to Dr. Jannifer Franklin. Please advise.

## 2018-04-06 MED ORDER — CYCLOBENZAPRINE HCL 10 MG PO TABS: 10.0000 mg | ORAL_TABLET | Freq: Three times a day (TID) | ORAL | 0 refills | Status: DC | PRN

## 2018-04-07 ENCOUNTER — Other Ambulatory Visit: Payer: Self-pay

## 2018-04-07 ENCOUNTER — Telehealth: Payer: Self-pay

## 2018-04-07 DIAGNOSIS — M62830 Muscle spasm of back: Secondary | ICD-10-CM

## 2018-04-07 DIAGNOSIS — M5126 Other intervertebral disc displacement, lumbar region: Secondary | ICD-10-CM

## 2018-04-07 DIAGNOSIS — M545 Low back pain, unspecified: Secondary | ICD-10-CM

## 2018-04-07 MED ORDER — CYCLOBENZAPRINE HCL 10 MG PO TABS: 10.0000 mg | ORAL_TABLET | Freq: Three times a day (TID) | ORAL | 0 refills | Status: DC | PRN

## 2018-04-07 NOTE — Telephone Encounter (Signed)
I contacted the pt, pt was on the wait list for a earlier appt. Due to current COVID 19 pandemic, our office is severely reducing in office visits for at least the next 2 weeks, in order to minimize the risk to our patients and healthcare providers.   Pt was advised we could move his appointment up to video visit consult.   Pt understands that although there may be some limitations with this type of visit, we will take all precautions to reduce any security or privacy concerns.  Pt understands that this will be treated like an in office visit and we will file with pt's insurance, and there may be a patient responsible charge related to this service.   Pt's email is [email or web address]. Pt understands that the [employer] webex software must be downloaded and operational on the device pt plans to use for the visit.  I reviewed medications, allergies, pharmacy and history with the pt verbally.

## 2018-04-08 ENCOUNTER — Encounter: Payer: Self-pay | Admitting: Neurology

## 2018-04-08 ENCOUNTER — Other Ambulatory Visit: Payer: Self-pay

## 2018-04-08 ENCOUNTER — Ambulatory Visit (INDEPENDENT_AMBULATORY_CARE_PROVIDER_SITE_OTHER): Payer: BLUE CROSS/BLUE SHIELD | Admitting: Neurology

## 2018-04-08 DIAGNOSIS — M5432 Sciatica, left side: Secondary | ICD-10-CM

## 2018-04-08 DIAGNOSIS — M5430 Sciatica, unspecified side: Secondary | ICD-10-CM

## 2018-04-08 HISTORY — DX: Sciatica, unspecified side: M54.30

## 2018-04-08 MED ORDER — GABAPENTIN 300 MG PO CAPS: 300.0000 mg | ORAL_CAPSULE | Freq: Three times a day (TID) | ORAL | 3 refills | Status: DC

## 2018-04-08 MED ORDER — PREDNISONE 10 MG PO TABS: ORAL_TABLET | ORAL | 0 refills | Status: DC

## 2018-04-08 NOTE — Progress Notes (Signed)
Virtual Visit via Video Note  I connected with Mason Vargas on 04/08/18 at 10:30 AM EDT by a video enabled telemedicine application and verified that I am speaking with the correct person using two identifiers.   I discussed the limitations of evaluation and management by telemedicine and the availability of in person appointments. The patient expressed understanding and agreed to proceed.  History of Present Illness: Mason Vargas is a 63 year old right-handed white male with a history of low back pain and sciatica off and on since he was in his 45s.  The patient has had occasional flareups, the last such flareup was 15 years ago.  The patient unfortunately 3 weeks ago jumped off of a log about 2 or 3 feet, when he landed, he felt a pop in the back, and he had severe disabling lancinating pain going down the left leg to the left foot.  The patient laid on the ground for about an hour, unable to get up and move.  Since that time, he has been placed on a prednisone taper for about 1 week, he has been placed on Flexeril and amitriptyline, he has had some slight improvement in pain, but he still has severe discomfort.  The pain goes from the back, into the left hip and into the left knee or kneecap, and then down to the ankle.  He may have some tingling down to the ankle but not into the foot.  He is able to ambulate but he feels as if the left leg is not working properly.  He has not had any further falls.  He denies any pain down the right leg or any difficulty controlling the bowels or the bladder.  He denies neck pain or pain in the arms.  He has been through physical therapy but the physical therapy was suspended secondary to the COVID-19 viral outbreak.  The patient has been taking some Tylenol for pain, he feels better on his side when he tries to sleep, lying flat on the back increases pain.  He is able to sit with some comfort, walking more than 30 or 40 feet increases the pain.  He fortunately is  able to work from home.  He takes 25 to 50 mg of amitriptyline at night, he will take Flexeril during the day.  He is sent here for further evaluation.   Observations/Objective: Objective evaluation reveals that the patient is alert, cooperative, responding appropriately.  Extraoculars are full, he is able to protrude the tongue in the midline, lateral movement of the tongue is full.  The patient has good finger-nose-finger bilaterally.  He is able to ambulate without assistance, he is able to walk on the heels and the toes bilaterally, walking on the heels increases pain down the left leg but he is able to walk without the toes touching the floor.  Assessment and Plan: 1.  Lumbar radiculopathy, left-sided sciatica  The patient may have an L4 or L5 radiculopathy involving the left leg.  The patient is still having significant pain 3 weeks out from initial injury.  The patient will be placed on another prednisone Dosepak for 12 days, he will be placed on gabapentin taking 300 mg 3 times daily.  He can take the amitriptyline at night.  The patient will contact me if he is no better in 3 weeks, we will get MRI of the lumbar spine at that time.  If the pain does not improve, we may consider an epidural steroid injection  or potentially a referral to surgery at that point.  He will follow-up here in 3 months.  Follow Up Instructions: 42-month follow-up.   I discussed the assessment and treatment plan with the patient. The patient was provided an opportunity to ask questions and all were answered. The patient agreed with the plan and demonstrated an understanding of the instructions.   The patient was advised to call back or seek an in-person evaluation if the symptoms worsen or if the condition fails to improve as anticipated.  I provided 30 minutes of non-face-to-face time during this encounter.   Kathrynn Ducking, MD

## 2018-04-11 ENCOUNTER — Telehealth: Payer: Self-pay | Admitting: Neurology

## 2018-04-11 NOTE — Telephone Encounter (Signed)
LVM for patient to schedule 3 month follow-up with Dr. Jannifer Franklin.

## 2018-04-12 ENCOUNTER — Ambulatory Visit: Payer: BLUE CROSS/BLUE SHIELD | Admitting: Physical Therapy

## 2018-04-14 ENCOUNTER — Ambulatory Visit: Payer: BLUE CROSS/BLUE SHIELD | Admitting: Physical Therapy

## 2018-04-14 ENCOUNTER — Other Ambulatory Visit: Payer: Self-pay

## 2018-04-14 ENCOUNTER — Encounter: Payer: Self-pay | Admitting: Physical Therapy

## 2018-04-14 ENCOUNTER — Ambulatory Visit: Payer: BLUE CROSS/BLUE SHIELD | Attending: Family Medicine | Admitting: Physical Therapy

## 2018-04-14 DIAGNOSIS — M5416 Radiculopathy, lumbar region: Secondary | ICD-10-CM | POA: Insufficient documentation

## 2018-04-14 DIAGNOSIS — M62830 Muscle spasm of back: Secondary | ICD-10-CM | POA: Diagnosis not present

## 2018-04-14 DIAGNOSIS — M5441 Lumbago with sciatica, right side: Secondary | ICD-10-CM | POA: Insufficient documentation

## 2018-04-14 DIAGNOSIS — R2689 Other abnormalities of gait and mobility: Secondary | ICD-10-CM | POA: Diagnosis not present

## 2018-04-14 NOTE — Patient Instructions (Signed)
Access Code: [email or web address]  URL: [email or web address]  Date: 04/14/2018  Prepared by: Carolyne Littles   Exercises  Lying Prone - 10 reps - 3 sets - 2x daily - 7x weekly  Child's Pose Stretch - 10 reps - 3 sets - 2x daily - 7x weekly  Standing 'L' Stretch at Counter - 10 reps - 3 sets - 2x daily - 7x weekly  Shoulder extension with resistance - Neutral - 10 reps - 3 sets - 1x daily - 7x weekly  Scapular Retraction with Resistance - 10 reps - 3 sets - 1x daily - 7x weekly

## 2018-04-14 NOTE — Therapy (Addendum)
[place], [place], [address] Phone: [phone]   Fax:  [phone]  Physical Therapy Treatment/Discharge   Patient Details  Name: Mason Vargas MRN: [id number] Date of Birth: 15-May-1955 Referring Provider (PT): Mellody Dance DO    Encounter Date: 04/14/2018  PT End of Session - 04/14/18 1215    Visit Number  5    Number of Visits  12    Date for PT Re-Evaluation  05/09/18    Authorization Type  [employer]     PT Start Time  1100    PT Stop Time  1144    PT Time Calculation (min)  44 min    Activity Tolerance  Patient tolerated treatment well    Behavior During Therapy  [facility] for tasks assessed/performed       Past Medical History:  Diagnosis Date  . Hyperlipidemia   . Hypertension   . Insomnia   . Lumbar herniated disc   . Sciatica 04/08/2018   Left leg    Past Surgical History:  Procedure Laterality Date  . COLONOSCOPY W/ POLYPECTOMY      There were no vitals filed for this visit.                    [facility] Adult PT Treatment/Exercise - 04/14/18 0001      Lumbar Exercises: Stretches   Prone on Elbows Stretch Limitations  prone lying without going up on elbows x5 minutes. No increase in pain. No real changein numbness     Other Lumbar Stretch Exercise  rayer stretch lateral prayer to the righ for left 3x30 sec holds each. Also reviewed how to do sink stretch.       Lumbar Exercises: Standing   Row Limitations  x20 red with     Shoulder Extension Limitations  x20 red with abdominal breathing       Manual Therapy   Manual Therapy  Joint mobilization;Soft tissue mobilization;Myofascial release    Manual therapy comments  able to perfrom full LAD on the left leg     Soft tissue mobilization  sidelying to lumbar spine with focus on the left    Myofascial Release  to lumbar spine              PT Education - 04/14/18 1214    Education Details  updated HEP; reviewed core  breathing     Person(s) Educated  Patient    Methods  Explanation;Demonstration;Tactile cues;Verbal cues    Comprehension  Verbalized understanding;Returned demonstration;Verbal cues required;Tactile cues required       PT Short Term Goals - 04/14/18 1306      PT SHORT TERM GOAL #1   Title  Patient will report cerntralizsed lower back pain > 3/10.     Time  3    Period  Weeks    Status  On-going    Target Date  04/18/18      PT SHORT TERM GOAL #2   Title  Patient will increase gross left hip strength to 3+/5    Time  3    Period  Weeks    Status  On-going      PT SHORT TERM GOAL #3   Title  Patient ambualte 300' without increased pain with LRAD     Time  3    Period  Weeks    Status  On-going    Target Date  04/18/18        PT Long  Term Goals - 04/04/18 1314      PT LONG TERM GOAL #1   Title  Patient will sleep through the night without pain     Baseline  sleeping longer     Time  6    Period  Weeks    Status  On-going      PT LONG TERM GOAL #2   Title  Patient will sit for 2 hours without self report of pain in order to perfrom work tasks    Time  6    Period  Weeks    Status  On-going      PT LONG TERM GOAL #3   Title  Patient will demonstrate a 35% limitation on FOTO     Time  6    Period  Weeks    Status  On-going            Plan - 04/14/18 1216    Clinical Impression Statement  Patient continues to improve. He was able to lie prone today. He had no numbness into his leg after treatment. He reported pressure lying prone but he did much better maintaining the position. He was also able to tolerate flull long axis distraction. Patient was shown a prayer stretch and the sink modification. He was also given UE core stability exercises. The patient has stopped using the cane. He was encouraged to focus on the 2-3 stretches and 2-3 exercises that are the most challenging fohim.     Examination-Activity Limitations  Bed Mobility;Squat;Stairs;Caring for  Others;Transfers;Sit;Sleep;Carry    Stability/Clinical Decision Making  Unstable/Unpredictable    PT Frequency  2x / week    PT Duration  6 weeks    PT Treatment/Interventions  ADLs/Self Care Home Management;Cryotherapy;Electrical Stimulation;Iontophoresis '4mg'$ /ml Dexamethasone;Moist Heat;Ultrasound;DME Instruction;Gait training;Functional mobility training;Cognitive remediation;Neuromuscular re-education;Therapeutic activities;Therapeutic exercise;Patient/family education;Manual techniques;Passive range of motion;Taping;Joint Manipulations;Spinal Manipulations    PT Next Visit Plan  correct lateral shift; see if we can get patient progen., light LAD in close packed position if able; soft tissue work wot lower back;     PT Home Exercise Plan  tennis ball trigger point release; work on Sports administrator with Plan of Care  Patient       Patient will benefit from skilled therapeutic intervention in order to improve the following deficits and impairments:  Abnormal gait, Pain, Increased fascial restricitons, Improper body mechanics, Postural dysfunction, Decreased activity tolerance, Decreased endurance, Decreased range of motion, Decreased strength  Visit Diagnosis: Radiculopathy, lumbar region  Acute left-sided low back pain with right-sided sciatica  Muscle spasm of back  Other abnormalities of gait and mobility  PHYSICAL THERAPY DISCHARGE SUMMARY  Visits from Start of Care: 5  Current functional level related to goals / functional outcomes:    Remaining deficits: Significant improvement in pain    Education / Equipment: HEP   Plan: Patient agrees to discharge.  Patient goals were not met. Patient is being discharged due to not returning since the last visit.  ?????       Problem List Patient Active Problem List   Diagnosis Date Noted  . Sciatica 04/08/2018  . GERD (gastroesophageal reflux disease) 03/22/2018  . Hypertension 03/22/2018  . Acute bilateral low  back pain with left-sided sciatica 03/22/2018  . Muscle spasm of back 03/22/2018  . Lumbar back pain 04/29/2017  . Lumbar herniated disc 03/22/2014  . BPH (benign prostatic hyperplasia) 03/03/2012  . Family history of ischemic heart disease (IHD) 08/10/2011  . Colon  adenoma 06/11/2011  . Vitamin D deficiency 06/11/2011  . ED (erectile dysfunction) 06/02/2011  . Hyperlipidemia 03/22/2002    Carney Living PT DPT  04/14/2018, 1:09 PM  [facility] [address] [place], [place], [address] Phone: [phone]   Fax:  [phone]  Name: Mason Vargas MRN: [id number] Date of Birth: 04/24/1955

## 2018-04-20 ENCOUNTER — Telehealth: Payer: Self-pay | Admitting: Family Medicine

## 2018-04-20 ENCOUNTER — Other Ambulatory Visit: Payer: Self-pay

## 2018-04-20 DIAGNOSIS — D126 Benign neoplasm of colon, unspecified: Secondary | ICD-10-CM

## 2018-04-20 DIAGNOSIS — E785 Hyperlipidemia, unspecified: Secondary | ICD-10-CM

## 2018-04-20 DIAGNOSIS — I10 Essential (primary) hypertension: Secondary | ICD-10-CM

## 2018-04-20 MED ORDER — LOSARTAN POTASSIUM-HCTZ 50-12.5 MG PO TABS: 1.0000 | ORAL_TABLET | Freq: Every day | ORAL | 0 refills | Status: DC

## 2018-04-20 MED ORDER — ATORVASTATIN CALCIUM 20 MG PO TABS: 20.0000 mg | ORAL_TABLET | Freq: Every day | ORAL | 0 refills | Status: DC

## 2018-04-20 NOTE — Telephone Encounter (Signed)
Sent to Dr. Raliegh Scarlet to review. MPulliam, CMA/RT(R)

## 2018-04-20 NOTE — Telephone Encounter (Signed)
Patient requesting refills on losartan-hctz and atorvastatin.  Medications last filled by a pervious provider. LOV 03/22/2018.  Please review and advise. MPulliam, CMA/RT(R)

## 2018-04-20 NOTE — Telephone Encounter (Signed)
#  30 tabs only, no RF until labwork completed  Pt has not had ANY labs in many years in his chart.  We discussed this and he was told to f/up in very near future for FBW with f/up appt.   Please schedule pt for a full set of FBW, with a telemed visit with me 3days or so later.

## 2018-04-20 NOTE — Telephone Encounter (Signed)
Patient requestin refilsl on (2) diff Rxs (orginally prescribed by prior PCP) Not Dr. Raliegh Scarlet;    1)---  losartan-hydrochlorothiazide (HYZAAR) 50-12.5 MG tablet [[id number]]   Order Details  Dose: 1 tablet Route: Oral Frequency: Daily  Dispense Quantity: -- Refills: -- Fills remaining: --        Sig: Take 1 tablet by mouth daily.        Written Date: -- Expiration Date: -- Ordering Date: 03/20/18   Start Date: -- End Date: --         Ordering Provider: -- DEA #:  -- NPI:  --   Authorizing Provider: [provider] DEA #:  -- NPI:  [id number]      &   2)--- atorvastatin (LIPITOR) 20 MG tablet [[id number]]   Order Details  Dose: 20 mg Route: Oral Frequency: Daily  Dispense Quantity: -- Refills: -- Fills remaining: --        Sig: Take 20 mg by mouth daily.       Written Date: -- Expiration Date: -- Ordering Date: 11/04/12   Start Date: -- End Date: --         Ordering Provider: -- DEA #:  -- NPI:  --   Authorizing Provider: [provider] DEA #:  -- NPI:  [id number]      ---Forwarding request to medical assistant if approved to send Refill order to :   [place], [place] - [place] [phone] (Phone) [phone] (Fax)   --glh

## 2018-04-22 ENCOUNTER — Other Ambulatory Visit: Payer: Self-pay | Admitting: Family Medicine

## 2018-04-22 DIAGNOSIS — E785 Hyperlipidemia, unspecified: Secondary | ICD-10-CM

## 2018-04-22 DIAGNOSIS — I10 Essential (primary) hypertension: Secondary | ICD-10-CM

## 2018-05-17 ENCOUNTER — Other Ambulatory Visit: Payer: Self-pay

## 2018-05-17 ENCOUNTER — Ambulatory Visit
Admission: EM | Admit: 2018-05-17 | Discharge: 2018-05-17 | Disposition: A | Discharge status: Home / Self Care | Payer: BLUE CROSS/BLUE SHIELD

## 2018-05-17 DIAGNOSIS — R040 Epistaxis: Secondary | ICD-10-CM | POA: Diagnosis not present

## 2018-05-17 DIAGNOSIS — S0993XA Unspecified injury of face, initial encounter: Secondary | ICD-10-CM | POA: Diagnosis not present

## 2018-05-17 DIAGNOSIS — W208XXA Other cause of strike by thrown, projected or falling object, initial encounter: Secondary | ICD-10-CM | POA: Diagnosis not present

## 2018-05-17 NOTE — Discharge Instructions (Signed)
No alarming signs on exam. You can use afrin intermittently if bleeding restarts. Do not use more than twice a day, more than 3 days as it can cause rebound congestion. You may have minor nose bleeds in the next few days, apply pressure and lean forward. If unable to stop bleeding, worsening pain, unable to breath through nose, follow up with ENT for further evaluation needed.  If experiencing worsening of symptoms, worsening headache/blurry vision, nausea/vomiting, confusion/altered mental status, dizziness, weakness, passing out, imbalance, go to the emergency department for further evaluation.

## 2018-05-17 NOTE — ED Provider Notes (Signed)
[facility]    CSN: [id number] Arrival date & time: 05/17/18  1531     History   Chief Complaint Chief Complaint  Patient presents with  . Facial Injury    HPI Mason Vargas is a 63 y.o. male.   63 year old male comes in for nasal/facial injury that occurred shortly prior to arrival. He was at [employer], while picking out lumbar, a 4x4 fell and hit his nose. Denies loss of consciousness. Has a mild headache without photophobia, nausea, vomiting. Able to ambulate on own. Bleeding from the left nostril is controlled with pressure. Has small abrasion across the nasal bone. Has not taken anything for the symptoms.      Past Medical History:  Diagnosis Date  . Hyperlipidemia   . Hypertension   . Insomnia   . Lumbar herniated disc   . Sciatica 04/08/2018   Left leg    Patient Active Problem List   Diagnosis Date Noted  . Sciatica 04/08/2018  . GERD (gastroesophageal reflux disease) 03/22/2018  . Hypertension 03/22/2018  . Acute bilateral low back pain with left-sided sciatica 03/22/2018  . Muscle spasm of back 03/22/2018  . Lumbar back pain 04/29/2017  . Lumbar herniated disc 03/22/2014  . BPH (benign prostatic hyperplasia) 03/03/2012  . Family history of ischemic heart disease (IHD) 08/10/2011  . Colon adenoma 06/11/2011  . Vitamin D deficiency 06/11/2011  . ED (erectile dysfunction) 06/02/2011  . Hyperlipidemia 03/22/2002    Past Surgical History:  Procedure Laterality Date  . COLONOSCOPY W/ POLYPECTOMY         Home Medications    Prior to Admission medications   Medication Sig Start Date End Date Taking? Authorizing Provider  amitriptyline (ELAVIL) 25 MG tablet 1-2 p.o. nightly as needed pain 03/22/18   Mellody Dance, DO  aspirin 81 MG tablet Take 81 mg by mouth daily.    [provider]  atorvastatin (LIPITOR) 20 MG tablet Take 1 tablet (20 mg total) by mouth at bedtime. 04/20/18   Opalski, Neoma Laming, DO  cyclobenzaprine (FLEXERIL) 10 MG  tablet Take 1 tablet (10 mg total) by mouth 3 (three) times daily as needed for muscle spasms. 04/07/18   Opalski, Neoma Laming, DO  gabapentin (NEURONTIN) 300 MG capsule Take 1 capsule (300 mg total) by mouth 3 (three) times daily. 04/08/18   Kathrynn Ducking, MD  losartan-hydrochlorothiazide (HYZAAR) 50-12.5 MG tablet Take 1 tablet by mouth daily. 04/20/18   Opalski, Neoma Laming, DO  omeprazole (PRILOSEC) 20 MG capsule Take 1 capsule by mouth daily.     [provider]  predniSONE (DELTASONE) 10 MG tablet Begin taking 6 tablets daily, taper by one tablet every other day until off the medication. 04/08/18   Kathrynn Ducking, MD    Family History Family History  Problem Relation Age of Onset  . Heart attack Father   . Heart Problems Father   . Hypertension Father   . Hyperlipidemia Father   . Colon cancer Neg Hx   . Pancreatic cancer Neg Hx   . Stomach cancer Neg Hx     Social History Social History   Tobacco Use  . Smoking status: Never Smoker  . Smokeless tobacco: Never Used  Substance Use Topics  . Alcohol use: Yes    Alcohol/week: 1.0 standard drinks    Types: 1 Standard drinks or equivalent per week    Comment: occasionally  . Drug use: No     Allergies   Patient has no known allergies.  Review of Systems Review of Systems  Reason unable to perform ROS: See HPI as above.     Physical Exam Triage Vital Signs ED Triage Vitals  Enc Vitals Group     BP 05/17/18 1539 137/90     Pulse Rate 05/17/18 1539 (!) 109     Resp 05/17/18 1539 18     Temp 05/17/18 1539 98 F (36.7 C)     Temp Source 05/17/18 1539 Oral     SpO2 05/17/18 1539 97 %     Weight --      Height --      Head Circumference --      Peak Flow --      Pain Score 05/17/18 1540 4     Pain Loc --      Pain Edu? --      Excl. in [place]? --    No data found.  Updated Vital Signs BP 137/90 (BP Location: Left Arm)   Pulse (!) 109   Temp 98 F (36.7 C) (Oral)   Resp 18   SpO2 97%   Physical Exam  Constitutional:      General: He is not in acute distress.    Appearance: He is well-developed. He is not ill-appearing, toxic-appearing or diaphoretic.  HENT:     Head: Normocephalic and atraumatic.     Nose:      Comments: About 1cm abrasion to the nasal bone, bleeding controlled. Nose slightly deviated to the left that is normal per patient. Slight swelling with mild contusion. Tenderness to palpation along nasal bone without crepitus, obvious deformity. Bilateral nostrils patent. No septal hematoma noted. Epistaxis to the left nostril. Eyes:     Extraocular Movements: Extraocular movements intact.     Conjunctiva/sclera: Conjunctivae normal.     Pupils: Pupils are equal, round, and reactive to light.  Neck:     Musculoskeletal: Neck supple.  Neurological:     Mental Status: He is alert and oriented to person, place, and time.      UC Treatments / Results  Labs (all labs ordered are listed, but only abnormal results are displayed) Labs Reviewed - No data to display  EKG None  Radiology No results found.  Procedures Procedures (including critical care time)  Medications Ordered in UC Medications - No data to display  Initial Impression / Assessment and Plan / UC Course  I have reviewed the triage vital signs and the nursing notes.  Pertinent labs & imaging results that were available during my care of the patient were reviewed by me and considered in my medical decision making (see chart for details).    Bleeding controlled after afrin. No alarming signs on exam. Afrin PRN. Return precautions given. Provided ENT information as needed.  Final Clinical Impressions(s) / UC Diagnoses   Final diagnoses:  Facial injury, initial encounter  Epistaxis    ED Prescriptions    None        Ok Edwards, PA-C 05/17/18 1611

## 2018-05-17 NOTE — ED Triage Notes (Signed)
Pt states at [employer], a 4x4 fell off rack hitting across the nose. C/o lt nare bleeding, abrasion to bridge of nose; pt c/o headache

## 2018-05-25 ENCOUNTER — Ambulatory Visit: Payer: Self-pay | Admitting: Otolaryngology

## 2018-05-25 ENCOUNTER — Ambulatory Visit: Payer: BLUE CROSS/BLUE SHIELD | Admitting: Diagnostic Neuroimaging

## 2018-05-25 ENCOUNTER — Other Ambulatory Visit (HOSPITAL_COMMUNITY)
Admission: RE | Admit: 2018-05-25 | Discharge: 2018-05-25 | Disposition: A | Discharge status: Home / Self Care | Payer: BLUE CROSS/BLUE SHIELD | Source: Ambulatory Visit | Attending: Otolaryngology | Admitting: Otolaryngology

## 2018-05-25 ENCOUNTER — Encounter (HOSPITAL_BASED_OUTPATIENT_CLINIC_OR_DEPARTMENT_OTHER): Payer: Self-pay | Admitting: *Deleted

## 2018-05-25 ENCOUNTER — Encounter (HOSPITAL_BASED_OUTPATIENT_CLINIC_OR_DEPARTMENT_OTHER)
Admission: RE | Admit: 2018-05-25 | Discharge: 2018-05-25 | Disposition: A | Discharge status: Home / Self Care | Payer: BLUE CROSS/BLUE SHIELD | Source: Ambulatory Visit | Attending: Otolaryngology | Admitting: Otolaryngology

## 2018-05-25 ENCOUNTER — Other Ambulatory Visit: Payer: Self-pay

## 2018-05-25 DIAGNOSIS — I10 Essential (primary) hypertension: Secondary | ICD-10-CM | POA: Diagnosis not present

## 2018-05-25 DIAGNOSIS — W208XXA Other cause of strike by thrown, projected or falling object, initial encounter: Secondary | ICD-10-CM | POA: Diagnosis not present

## 2018-05-25 DIAGNOSIS — Z1159 Encounter for screening for other viral diseases: Secondary | ICD-10-CM | POA: Insufficient documentation

## 2018-05-25 DIAGNOSIS — E785 Hyperlipidemia, unspecified: Secondary | ICD-10-CM | POA: Diagnosis not present

## 2018-05-25 DIAGNOSIS — G4700 Insomnia, unspecified: Secondary | ICD-10-CM | POA: Diagnosis not present

## 2018-05-25 DIAGNOSIS — K219 Gastro-esophageal reflux disease without esophagitis: Secondary | ICD-10-CM | POA: Diagnosis not present

## 2018-05-25 DIAGNOSIS — S022XXA Fracture of nasal bones, initial encounter for closed fracture: Secondary | ICD-10-CM | POA: Diagnosis not present

## 2018-05-25 DIAGNOSIS — M5432 Sciatica, left side: Secondary | ICD-10-CM | POA: Diagnosis not present

## 2018-05-25 DIAGNOSIS — Z7982 Long term (current) use of aspirin: Secondary | ICD-10-CM | POA: Diagnosis not present

## 2018-05-25 DIAGNOSIS — Y92512 Supermarket, store or market as the place of occurrence of the external cause: Secondary | ICD-10-CM | POA: Diagnosis not present

## 2018-05-25 DIAGNOSIS — Z79899 Other long term (current) drug therapy: Secondary | ICD-10-CM | POA: Diagnosis not present

## 2018-05-25 LAB — BASIC METABOLIC PANEL
Anion gap: 9 (ref 5–15)
BUN: 15 mg/dL (ref 8–23)
CO2: 26 mmol/L (ref 22–32)
Calcium: 9.3 mg/dL (ref 8.9–10.3)
Chloride: 102 mmol/L (ref 98–111)
Creatinine, Ser: 0.91 mg/dL (ref 0.61–1.24)
GFR calc Af Amer: >60 mL/min (ref >60)
GFR calc non Af Amer: >60 mL/min (ref >60)
Glucose, Bld: 134 mg/dL — ABNORMAL HIGH (ref 70–99)
Potassium: 3.6 mmol/L (ref 3.5–5.1)
Sodium: 137 mmol/L (ref 135–145)

## 2018-05-25 LAB — SARS CORONAVIRUS 2 BY RT PCR (HOSPITAL ORDER, PERFORMED IN ~~LOC~~ HOSPITAL LAB): SARS Coronavirus 2: NEGATIVE

## 2018-05-25 NOTE — H&P (Signed)
PREOPERATIVE H&P  Chief Complaint: nasal fracture  HPI: Mason Vargas is a 63 y.o. male who presents for evaluation of nasal fracture. He was at [facility] 9 days ago loading a 4 x 4 which fell and struck him on the nose causing his nose to bleed. After the swelling resolved he noticed a deformity of his nose with the nasal dorsum deviated more to the left. He's always had more trouble breathing through the nose on the right side because of septal deformity. He is taken to the operating room this time for closed reduction nasal fracture.  Past Medical History:  Diagnosis Date  . Hyperlipidemia   . Hypertension   . Insomnia   . Lumbar herniated disc   . Sciatica 04/08/2018   Left leg   Past Surgical History:  Procedure Laterality Date  . COLONOSCOPY W/ POLYPECTOMY     Social History   Socioeconomic History  . Marital status: Married    Spouse name: Not on file  . Number of children: 0  . Years of education: Not on file  . Highest education level: Some college, no degree  Occupational History  . Not on file  Social Needs  . Financial resource strain: Not on file  . Food insecurity:    Worry: Not on file    Inability: Not on file  . Transportation needs:    Medical: Not on file    Non-medical: Not on file  Tobacco Use  . Smoking status: Never Smoker  . Smokeless tobacco: Never Used  Substance and Sexual Activity  . Alcohol use: Yes    Alcohol/week: 1.0 standard drinks    Types: 1 Standard drinks or equivalent per week    Comment: occasionally  . Drug use: No  . Sexual activity: Yes    Birth control/protection: None  Lifestyle  . Physical activity:    Days per week: Not on file    Minutes per session: Not on file  . Stress: Not on file  Relationships  . Social connections:    Talks on phone: Not on file    Gets together: Not on file    Attends religious service: Not on file    Active member of club or organization: Not on file    Attends meetings of clubs or  organizations: Not on file    Relationship status: Not on file  Other Topics Concern  . Not on file  Social History Narrative   Right handed    Caffeine 33 -4 cups daily    Family History  Problem Relation Age of Onset  . Heart attack Father   . Heart Problems Father   . Hypertension Father   . Hyperlipidemia Father   . Colon cancer Neg Hx   . Pancreatic cancer Neg Hx   . Stomach cancer Neg Hx    No Known Allergies Prior to Admission medications   Medication Sig Start Date End Date Taking? Authorizing Provider  amitriptyline (ELAVIL) 25 MG tablet 1-2 p.o. nightly as needed pain 03/22/18   Mellody Dance, DO  aspirin 81 MG tablet Take 81 mg by mouth daily.    [provider]  atorvastatin (LIPITOR) 20 MG tablet Take 1 tablet (20 mg total) by mouth at bedtime. 04/20/18   Opalski, Neoma Laming, DO  cyclobenzaprine (FLEXERIL) 10 MG tablet Take 1 tablet (10 mg total) by mouth 3 (three) times daily as needed for muscle spasms. 04/07/18   Opalski, Deborah, DO  gabapentin (NEURONTIN) 300 MG capsule Take 1  capsule (300 mg total) by mouth 3 (three) times daily. 04/08/18   Kathrynn Ducking, MD  losartan-hydrochlorothiazide (HYZAAR) 50-12.5 MG tablet Take 1 tablet by mouth daily. 04/20/18   Opalski, Neoma Laming, DO  omeprazole (PRILOSEC) 20 MG capsule Take 1 capsule by mouth daily.     [provider]  predniSONE (DELTASONE) 10 MG tablet Begin taking 6 tablets daily, taper by one tablet every other day until off the medication. 04/08/18   Kathrynn Ducking, MD     Positive ROS: negative  All other systems have been reviewed and were otherwise negative with the exception of those mentioned in the HPI and as above.  Physical Exam: There were no vitals filed for this visit.  General: Alert, no acute distress Oral: Normal oral mucosa and tonsils Nasal: anterior septal deviation to the right. No evidence of septal hematoma. Nasal dorsal deformity to the left with slight depression the  right nasal bone. Neck: No palpable adenopathy or thyroid nodules Ear: Ear canal is clear with normal appearing TMs Cardiovascular: Regular rate and rhythm, no murmur.  Respiratory: Clear to auscultation Neurologic: Alert and oriented x 3   Assessment/Plan: NASAL FRACTURE Plan for Procedure(s): CLOSED REDUCTION NASAL FRACTURE   Melony Overly, MD 05/25/2018 10:52 AM

## 2018-05-26 ENCOUNTER — Other Ambulatory Visit: Payer: Self-pay

## 2018-05-26 ENCOUNTER — Ambulatory Visit (HOSPITAL_BASED_OUTPATIENT_CLINIC_OR_DEPARTMENT_OTHER): Payer: BLUE CROSS/BLUE SHIELD | Admitting: Certified Registered"

## 2018-05-26 ENCOUNTER — Encounter (HOSPITAL_BASED_OUTPATIENT_CLINIC_OR_DEPARTMENT_OTHER): Payer: Self-pay | Admitting: *Deleted

## 2018-05-26 ENCOUNTER — Ambulatory Visit (HOSPITAL_BASED_OUTPATIENT_CLINIC_OR_DEPARTMENT_OTHER)
Admission: RE | Admit: 2018-05-26 | Discharge: 2018-05-26 | Disposition: A | Discharge status: Home / Self Care | Payer: BLUE CROSS/BLUE SHIELD | Attending: Otolaryngology | Admitting: Otolaryngology

## 2018-05-26 ENCOUNTER — Encounter (HOSPITAL_BASED_OUTPATIENT_CLINIC_OR_DEPARTMENT_OTHER)
Admission: RE | Disposition: A | Discharge status: Home / Self Care | Payer: Self-pay | Source: Home / Self Care | Attending: Otolaryngology

## 2018-05-26 DIAGNOSIS — Z79899 Other long term (current) drug therapy: Secondary | ICD-10-CM | POA: Insufficient documentation

## 2018-05-26 DIAGNOSIS — Z7982 Long term (current) use of aspirin: Secondary | ICD-10-CM | POA: Insufficient documentation

## 2018-05-26 DIAGNOSIS — Y92512 Supermarket, store or market as the place of occurrence of the external cause: Secondary | ICD-10-CM | POA: Insufficient documentation

## 2018-05-26 DIAGNOSIS — I10 Essential (primary) hypertension: Secondary | ICD-10-CM | POA: Insufficient documentation

## 2018-05-26 DIAGNOSIS — G4700 Insomnia, unspecified: Secondary | ICD-10-CM | POA: Diagnosis not present

## 2018-05-26 DIAGNOSIS — W208XXA Other cause of strike by thrown, projected or falling object, initial encounter: Secondary | ICD-10-CM | POA: Insufficient documentation

## 2018-05-26 DIAGNOSIS — N400 Enlarged prostate without lower urinary tract symptoms: Secondary | ICD-10-CM | POA: Diagnosis not present

## 2018-05-26 DIAGNOSIS — K219 Gastro-esophageal reflux disease without esophagitis: Secondary | ICD-10-CM | POA: Diagnosis not present

## 2018-05-26 DIAGNOSIS — S022XXA Fracture of nasal bones, initial encounter for closed fracture: Secondary | ICD-10-CM | POA: Diagnosis not present

## 2018-05-26 DIAGNOSIS — D126 Benign neoplasm of colon, unspecified: Secondary | ICD-10-CM

## 2018-05-26 DIAGNOSIS — M5432 Sciatica, left side: Secondary | ICD-10-CM | POA: Diagnosis not present

## 2018-05-26 DIAGNOSIS — E785 Hyperlipidemia, unspecified: Secondary | ICD-10-CM | POA: Diagnosis not present

## 2018-05-26 HISTORY — PX: CLOSED REDUCTION NASAL FRACTURE: SHX5365

## 2018-05-26 HISTORY — DX: Gastro-esophageal reflux disease without esophagitis: K21.9

## 2018-05-26 HISTORY — DX: Snoring: R06.83

## 2018-05-26 SURGERY — CLOSED REDUCTION, FRACTURE, NASAL BONE
Anesthesia: General | Site: Nose

## 2018-05-26 MED ORDER — FENTANYL CITRATE (PF) 100 MCG/2ML IJ SOLN
INTRAMUSCULAR | Status: AC
  Filled 2018-05-26: qty 2

## 2018-05-26 MED ORDER — LIDOCAINE HCL (CARDIAC) PF 100 MG/5ML IV SOSY
PREFILLED_SYRINGE | INTRAVENOUS | Status: DC | PRN
  Administered 2018-05-26: 80 mg via INTRAVENOUS

## 2018-05-26 MED ORDER — FENTANYL CITRATE (PF) 100 MCG/2ML IJ SOLN
50.0000 ug | INTRAMUSCULAR | Status: DC | PRN
  Administered 2018-05-26: 100 ug via INTRAVENOUS

## 2018-05-26 MED ORDER — GLYCOPYRROLATE 0.2 MG/ML IJ SOLN
INTRAMUSCULAR | Status: DC | PRN
  Administered 2018-05-26: 0.2 mg via INTRAVENOUS

## 2018-05-26 MED ORDER — CEFAZOLIN SODIUM-DEXTROSE 2-4 GM/100ML-% IV SOLN
INTRAVENOUS | Status: AC
  Filled 2018-05-26: qty 100

## 2018-05-26 MED ORDER — LIDOCAINE 2% (20 MG/ML) 5 ML SYRINGE
INTRAMUSCULAR | Status: AC
  Filled 2018-05-26: qty 5

## 2018-05-26 MED ORDER — ONDANSETRON HCL 4 MG/2ML IJ SOLN
INTRAMUSCULAR | Status: DC | PRN
  Administered 2018-05-26: 4 mg via INTRAVENOUS

## 2018-05-26 MED ORDER — ONDANSETRON HCL 4 MG/2ML IJ SOLN
INTRAMUSCULAR | Status: AC
  Filled 2018-05-26: qty 2

## 2018-05-26 MED ORDER — METOCLOPRAMIDE HCL 5 MG/ML IJ SOLN: 10.0000 mg | Freq: Once | INTRAMUSCULAR | Status: DC | PRN

## 2018-05-26 MED ORDER — OXYMETAZOLINE HCL 0.05 % NA SOLN
NASAL | Status: AC
  Filled 2018-05-26: qty 30

## 2018-05-26 MED ORDER — GLYCOPYRROLATE PF 0.2 MG/ML IJ SOSY
PREFILLED_SYRINGE | INTRAMUSCULAR | Status: AC
  Filled 2018-05-26: qty 1

## 2018-05-26 MED ORDER — FENTANYL CITRATE (PF) 100 MCG/2ML IJ SOLN
25.0000 ug | INTRAMUSCULAR | Status: DC | PRN
  Administered 2018-05-26 (×2): 50 ug via INTRAVENOUS

## 2018-05-26 MED ORDER — PROPOFOL 10 MG/ML IV BOLUS
INTRAVENOUS | Status: AC
  Filled 2018-05-26: qty 40

## 2018-05-26 MED ORDER — CHLORHEXIDINE GLUCONATE CLOTH 2 % EX PADS: 6.0000 | MEDICATED_PAD | Freq: Once | CUTANEOUS | Status: DC

## 2018-05-26 MED ORDER — MIDAZOLAM HCL 2 MG/2ML IJ SOLN
1.0000 mg | INTRAMUSCULAR | Status: DC | PRN
  Administered 2018-05-26: 2 mg via INTRAVENOUS

## 2018-05-26 MED ORDER — DEXAMETHASONE SODIUM PHOSPHATE 10 MG/ML IJ SOLN
INTRAMUSCULAR | Status: DC | PRN
  Administered 2018-05-26: 10 mg via INTRAVENOUS

## 2018-05-26 MED ORDER — PROPOFOL 10 MG/ML IV BOLUS
INTRAVENOUS | Status: DC | PRN
  Administered 2018-05-26: 200 mg via INTRAVENOUS

## 2018-05-26 MED ORDER — CEFAZOLIN SODIUM-DEXTROSE 2-3 GM-%(50ML) IV SOLR
INTRAVENOUS | Status: DC | PRN
  Administered 2018-05-26: 2 g via INTRAVENOUS

## 2018-05-26 MED ORDER — LACTATED RINGERS IV SOLN
INTRAVENOUS | Status: DC
  Administered 2018-05-26: 08:00:00 via INTRAVENOUS

## 2018-05-26 MED ORDER — MEPERIDINE HCL 25 MG/ML IJ SOLN: 6.2500 mg | INTRAMUSCULAR | Status: DC | PRN

## 2018-05-26 MED ORDER — OXYMETAZOLINE HCL 0.05 % NA SOLN
NASAL | Status: DC | PRN
  Administered 2018-05-26: 1 via TOPICAL

## 2018-05-26 MED ORDER — BACITRACIN ZINC 500 UNIT/GM EX OINT
TOPICAL_OINTMENT | CUTANEOUS | Status: AC
  Filled 2018-05-26: qty 28.35

## 2018-05-26 MED ORDER — MIDAZOLAM HCL 2 MG/2ML IJ SOLN
INTRAMUSCULAR | Status: AC
  Filled 2018-05-26: qty 2

## 2018-05-26 MED ORDER — LACTATED RINGERS IV SOLN: INTRAVENOUS | Status: DC

## 2018-05-26 MED ORDER — SCOPOLAMINE 1 MG/3DAYS TD PT72: 1.0000 | MEDICATED_PATCH | Freq: Once | TRANSDERMAL | Status: DC | PRN

## 2018-05-26 MED ORDER — CEFAZOLIN SODIUM-DEXTROSE 2-4 GM/100ML-% IV SOLN: 2.0000 g | INTRAVENOUS | Status: DC

## 2018-05-26 MED ORDER — LIDOCAINE-EPINEPHRINE 1 %-1:100000 IJ SOLN
INTRAMUSCULAR | Status: AC
  Filled 2018-05-26: qty 1

## 2018-05-26 MED ORDER — DEXAMETHASONE SODIUM PHOSPHATE 10 MG/ML IJ SOLN
INTRAMUSCULAR | Status: AC
  Filled 2018-05-26: qty 1

## 2018-05-26 MED ORDER — MUPIROCIN 2 % EX OINT
TOPICAL_OINTMENT | CUTANEOUS | Status: AC
  Filled 2018-05-26: qty 22

## 2018-05-26 SURGICAL SUPPLY — 27 items
BENZOIN TINCTURE PRP APPL 2/3 (GAUZE/BANDAGES/DRESSINGS) ×3 IMPLANT
CANISTER SUCT 1200ML W/VALVE (MISCELLANEOUS) ×3 IMPLANT
CLOSURE WOUND 1/2 X4 (GAUZE/BANDAGES/DRESSINGS) ×1
CONT SPEC 4OZ CLIKSEAL STRL BL (MISCELLANEOUS) IMPLANT
DEPRESSOR TONGUE BLADE STERILE (MISCELLANEOUS) ×3 IMPLANT
DRSG TELFA 3X8 NADH (GAUZE/BANDAGES/DRESSINGS) IMPLANT
GAUZE SPONGE 4X4 12PLY STRL LF (GAUZE/BANDAGES/DRESSINGS) ×3 IMPLANT
GAUZE VASELINE FOILPK 1/2 X 72 (GAUZE/BANDAGES/DRESSINGS) IMPLANT
GLOVE BIOGEL PI IND STRL 7.0 (GLOVE) ×1 IMPLANT
GLOVE BIOGEL PI INDICATOR 7.0 (GLOVE) ×2
GLOVE SS BIOGEL STRL SZ 7.5 (GLOVE) ×1 IMPLANT
GLOVE SUPERSENSE BIOGEL SZ 7.5 (GLOVE) ×2
GOWN STRL REUS W/ TWL LRG LVL3 (GOWN DISPOSABLE) ×1 IMPLANT
GOWN STRL REUS W/TWL LRG LVL3 (GOWN DISPOSABLE) ×2
MARKER SKIN DUAL TIP RULER LAB (MISCELLANEOUS) IMPLANT
NEEDLE PRECISIONGLIDE 27X1.5 (NEEDLE) IMPLANT
PACK BASIN DAY SURGERY FS (CUSTOM PROCEDURE TRAY) ×3 IMPLANT
PATTIES SURGICAL .5 X3 (DISPOSABLE) ×3 IMPLANT
SHEET MEDIUM DRAPE 40X70 STRL (DRAPES) ×3 IMPLANT
SPLINT NASAL THERMO PLAST (MISCELLANEOUS) ×3 IMPLANT
STRIP CLOSURE SKIN 1/2X4 (GAUZE/BANDAGES/DRESSINGS) ×2 IMPLANT
SUT SILK 2 0 PERMA HAND 18 BK (SUTURE) IMPLANT
SYR CONTROL 10ML LL (SYRINGE) IMPLANT
TOWEL GREEN STERILE FF (TOWEL DISPOSABLE) ×3 IMPLANT
TUBE CONNECTING 20'X1/4 (TUBING) ×1
TUBE CONNECTING 20X1/4 (TUBING) ×2 IMPLANT
YANKAUER SUCT BULB TIP NO VENT (SUCTIONS) ×3 IMPLANT

## 2018-05-26 NOTE — Discharge Instructions (Addendum)
Elevate head and apply cool compress to nose to reduce swelling for the next 12 hrs Tylenol, or ibuprofen prn pain Return to Dr Pollie Friar office next Wed at 10:30 to have cast removed Call office if any problems or questions    [phone]    Post Anesthesia Home Care Instructions  Activity: Get plenty of rest for the remainder of the day. A responsible individual must stay with you for 24 hours following the procedure.  For the next 24 hours, DO NOT: -Drive a car -Paediatric nurse -Drink alcoholic beverages -Take any medication unless instructed by your physician -Make any legal decisions or sign important papers.  Meals: Start with liquid foods such as gelatin or soup. Progress to regular foods as tolerated. Avoid greasy, spicy, heavy foods. If nausea and/or vomiting occur, drink only clear liquids until the nausea and/or vomiting subsides. Call your physician if vomiting continues.  Special Instructions/Symptoms: Your throat may feel dry or sore from the anesthesia or the breathing tube placed in your throat during surgery. If this causes discomfort, gargle with warm salt water. The discomfort should disappear within 24 hours.  If you had a scopolamine patch placed behind your ear for the management of post- operative nausea and/or vomiting:  1. The medication in the patch is effective for 72 hours, after which it should be removed.  Wrap patch in a tissue and discard in the trash. Wash hands thoroughly with soap and water. 2. You may remove the patch earlier than 72 hours if you experience unpleasant side effects which may include dry mouth, dizziness or visual disturbances. 3. Avoid touching the patch. Wash your hands with soap and water after contact with the patch.

## 2018-05-26 NOTE — Interval H&P Note (Signed)
History and Physical Interval Note:  05/26/2018 8:29 AM  Mason Vargas  has presented today for surgery, with the diagnosis of NASAL FRACTURE.  The various methods of treatment have been discussed with the patient and family. After consideration of risks, benefits and other options for treatment, the patient has consented to  Procedure(s): CLOSED REDUCTION NASAL FRACTURE (N/A) as a surgical intervention.  The patient's history has been reviewed, patient examined, no change in status, stable for surgery.  I have reviewed the patient's chart and labs.  Questions were answered to the patient's satisfaction.     Melony Overly

## 2018-05-26 NOTE — Anesthesia Procedure Notes (Signed)
Procedure Name: LMA Insertion Performed by: Verita Lamb, CRNA Pre-anesthesia Checklist: Patient identified, Emergency Drugs available, Suction available, Patient being monitored and Timeout performed Patient Re-evaluated:Patient Re-evaluated prior to induction Oxygen Delivery Method: Circle system utilized Preoxygenation: Pre-oxygenation with 100% oxygen Induction Type: IV induction LMA: LMA inserted LMA Size: 5.0 Number of attempts: 1 Placement Confirmation: positive ETCO2,  CO2 detector and breath sounds checked- equal and bilateral Tube secured with: Tape Dental Injury: Teeth and Oropharynx as per pre-operative assessment

## 2018-05-26 NOTE — Anesthesia Preprocedure Evaluation (Signed)
Anesthesia Evaluation  Patient identified by MRN, date of birth, ID band Patient awake    Reviewed: Allergy & Precautions, NPO status , Patient's Chart, lab work & pertinent test results  Airway Mallampati: II  TM Distance: >3 FB Neck ROM: Full    Dental no notable dental hx.    Pulmonary neg pulmonary ROS,    Pulmonary exam normal breath sounds clear to auscultation       Cardiovascular hypertension, Pt. on medications Normal cardiovascular exam Rhythm:Regular Rate:Normal     Neuro/Psych negative neurological ROS  negative psych ROS   GI/Hepatic Neg liver ROS, GERD  Controlled,  Endo/Other  negative endocrine ROS  Renal/GU negative Renal ROS  negative genitourinary   Musculoskeletal negative musculoskeletal ROS (+)   Abdominal   Peds negative pediatric ROS (+)  Hematology negative hematology ROS (+)   Anesthesia Other Findings   Reproductive/Obstetrics negative OB ROS                             Anesthesia Physical Anesthesia Plan  ASA: II  Anesthesia Plan: General   Post-op Pain Management:    Induction: Intravenous  PONV Risk Score and Plan: 2 and Ondansetron, Dexamethasone and Treatment may vary due to age or medical condition  Airway Management Planned: LMA  Additional Equipment:   Intra-op Plan:   Post-operative Plan: Extubation in OR  Informed Consent: I have reviewed the patients History and Physical, chart, labs and discussed the procedure including the risks, benefits and alternatives for the proposed anesthesia with the patient or authorized representative who has indicated his/her understanding and acceptance.     Dental advisory given  Plan Discussed with: CRNA  Anesthesia Plan Comments:         Anesthesia Quick Evaluation

## 2018-05-26 NOTE — Transfer of Care (Signed)
Immediate Anesthesia Transfer of Care Note  Patient: SOLLY DERASMO  Procedure(s) Performed: CLOSED REDUCTION NASAL FRACTURE (N/A Nose)  Patient Location: PACU  Anesthesia Type:General  Level of Consciousness: awake, alert  and oriented  Airway & Oxygen Therapy: Patient Spontanous Breathing and Patient connected to face mask oxygen  Post-op Assessment: Report given to RN and Post -op Vital signs reviewed and stable  Post vital signs: Reviewed and stable  Last Vitals:  Vitals Value Taken Time  BP    Temp    Pulse 97 05/26/2018  9:11 AM  Resp    SpO2 100 % 05/26/2018  9:11 AM  Vitals shown include unvalidated device data.  Last Pain:  Vitals:   05/26/18 0717  TempSrc: Oral  PainSc: 2       Patients Stated Pain Goal: 1 (44/96/75 9163)  Complications: No apparent anesthesia complications

## 2018-05-26 NOTE — Op Note (Signed)
NAME: Mason Vargas, Mason Vargas MEDICAL RECORD [id number] ACCOUNT [id number] DATE OF BIRTH:28-Aug-1955 FACILITY: [facility] LOCATION: [facility] PHYSICIAN:Raphaela Cannaday Lincoln Maxin, MD  OPERATIVE REPORT  DATE OF PROCEDURE:  05/26/2018  PREOPERATIVE DIAGNOSIS:  Nasal fracture.  POSTOPERATIVE DIAGNOSIS:  Nasal fracture.  OPERATION PERFORMED:  Closed reduction nasal fracture.  SURGEON:  Melony Overly, MD  ANESTHESIA:  General LMA.  ESTIMATED BLOOD LOSS:  Minimal.  COMPLICATIONS:  None.  BRIEF CLINICAL NOTE:  The patient is a 63 year old gentleman who had a 4 x 4 land on his nose and face, sustaining a nasal fracture 9 days ago.  He was taken to the operating room at this time for a closed reduction of nasal fracture.  DESCRIPTION OF PROCEDURE:  The patient underwent general anesthesia utilizing LMA.  Nose was then examined.  The patient had a deviation of the nasal dorsum to the left with septal deviation to the right.  There was no evidence of septal hematoma.  Nose  was decongested with cotton pledgets soaked in Afrin, and then using a butter knife and manual manipulation, the nasal bones were realigned.  The right nasal bone was elevated and the left nasal bone was pushed back, more to the right.  Following  adequate reduction of nasal fracture, Steri-Strips, benzoin, and a thermoplastic cast was applied to the nasal dorsum.  There was minimal bleeding.  The patient was awoken from anesthesia and transferred to recovery room and postoperatively doing well.  DISPOSITION:  The patient was discharged home later this morning, and he was instructed to keep the thermoplastic cast dry.  We will have him follow up in my office in 6 days to have the thermoplastic cast removed and for recheck.  LN/NUANCE  D:05/26/2018 T:05/26/2018 JOB:[id number]

## 2018-05-26 NOTE — Anesthesia Postprocedure Evaluation (Signed)
Anesthesia Post Note  Patient: Mason Vargas  Procedure(s) Performed: CLOSED REDUCTION NASAL FRACTURE (N/A Nose)     Patient location during evaluation: PACU Anesthesia Type: General Level of consciousness: awake and alert Pain management: pain level controlled Vital Signs Assessment: post-procedure vital signs reviewed and stable Respiratory status: spontaneous breathing, nonlabored ventilation, respiratory function stable and patient connected to nasal cannula oxygen Cardiovascular status: blood pressure returned to baseline and stable Postop Assessment: no apparent nausea or vomiting Anesthetic complications: no    Last Vitals:  Vitals:   05/26/18 1000 05/26/18 1039  BP: 136/84 (!) 141/87  Pulse: 82 77  Resp: 11 18  Temp:  36.6 C  SpO2: 95% 99%    Last Pain:  Vitals:   05/26/18 1039  TempSrc:   PainSc: 3                  Montez Hageman

## 2018-05-26 NOTE — Brief Op Note (Signed)
05/26/2018  9:08 AM  PATIENT:  [name]  63 y.o. male  PRE-OPERATIVE DIAGNOSIS:  NASAL FRACTURE  POST-OPERATIVE DIAGNOSIS:  NASAL FRACTURE  PROCEDURE:  Procedure(s): CLOSED REDUCTION NASAL FRACTURE (N/A)  SURGEON:  Surgeon(s) and Role:    Rozetta Nunnery, MD - Primary  PHYSICIAN ASSISTANT:   ASSISTANTS: none   ANESTHESIA:   general  EBL:  minimal   BLOOD ADMINISTERED:none  DRAINS: none   LOCAL MEDICATIONS USED:  NONE  SPECIMEN:  No Specimen  DISPOSITION OF SPECIMEN:  N/A  COUNTS:  YES  TOURNIQUET:  * No tourniquets in log *  DICTATION: .Other Dictation: Dictation Number [id number]  PLAN OF CARE: Discharge to home after PACU  PATIENT DISPOSITION:  PACU - hemodynamically stable.   Delay start of Pharmacological VTE agent (>24hrs) due to surgical blood loss or risk of bleeding: yes

## 2018-05-27 ENCOUNTER — Ambulatory Visit: Payer: BLUE CROSS/BLUE SHIELD | Admitting: Neurology

## 2018-05-27 ENCOUNTER — Encounter (HOSPITAL_BASED_OUTPATIENT_CLINIC_OR_DEPARTMENT_OTHER): Payer: Self-pay | Admitting: Otolaryngology

## 2018-07-11 ENCOUNTER — Telehealth: Payer: Self-pay | Admitting: Neurology

## 2018-07-11 NOTE — Telephone Encounter (Signed)
Due to current COVID 19 pandemic, our office is severely reducing in office visits until further notice, in order to minimize the risk to our patients and healthcare providers.   I called patient regarding his 6/30 appointment and asked patient if he would like to come in the office for this appointment. Patient states that he will be unable to attend this appt due to meetings that he has. I asked patient if he would like to reschedule this appt. Patient states that he does not feel like he needs this appt any longer and states that his issues have improved. I advised patient that I would cancel this appt and that patient can call back if anything changes. Pt verbalized understanding.

## 2018-07-11 NOTE — Telephone Encounter (Signed)
Noted  

## 2018-07-12 ENCOUNTER — Ambulatory Visit: Payer: BLUE CROSS/BLUE SHIELD | Admitting: Neurology

## 2018-07-21 ENCOUNTER — Ambulatory Visit (INDEPENDENT_AMBULATORY_CARE_PROVIDER_SITE_OTHER): Payer: BC Managed Care – PPO | Admitting: Family Medicine

## 2018-07-21 ENCOUNTER — Ambulatory Visit: Payer: BLUE CROSS/BLUE SHIELD | Admitting: Family Medicine

## 2018-07-21 ENCOUNTER — Encounter: Payer: Self-pay | Admitting: Family Medicine

## 2018-07-21 VITALS — BP 141/88

## 2018-07-21 DIAGNOSIS — I10 Essential (primary) hypertension: Secondary | ICD-10-CM

## 2018-07-21 DIAGNOSIS — E785 Hyperlipidemia, unspecified: Secondary | ICD-10-CM

## 2018-07-21 DIAGNOSIS — N529 Male erectile dysfunction, unspecified: Secondary | ICD-10-CM

## 2018-07-21 DIAGNOSIS — K219 Gastro-esophageal reflux disease without esophagitis: Secondary | ICD-10-CM

## 2018-07-21 MED ORDER — ATORVASTATIN CALCIUM 20 MG PO TABS: 20.0000 mg | ORAL_TABLET | Freq: Every day | ORAL | 3 refills | Status: DC

## 2018-07-21 MED ORDER — OMEPRAZOLE 20 MG PO CPDR: 20.0000 mg | DELAYED_RELEASE_CAPSULE | Freq: Every day | ORAL | 3 refills | Status: DC

## 2018-07-21 MED ORDER — TADALAFIL 10 MG PO TABS: 5.0000 mg | ORAL_TABLET | Freq: Every day | ORAL | 3 refills | Status: DC | PRN

## 2018-07-21 MED ORDER — LOSARTAN POTASSIUM-HCTZ 50-12.5 MG PO TABS: 0.5000 | ORAL_TABLET | Freq: Every day | ORAL | 3 refills | Status: DC

## 2018-07-21 MED FILL — LOSARTAN-HCTZ 50-12.5 MG TA: 50-12.5 | 90 days supply | Qty: 45 | Fill #0

## 2018-07-21 MED FILL — ATORVASTATIN 20 MG TABLET: 20 | 90 days supply | Qty: 90 | Fill #0

## 2018-07-21 MED FILL — OMEPRAZOLE 20 MG CPDR: 20 | 90 days supply | Qty: 90 | Fill #0

## 2018-07-21 NOTE — Assessment & Plan Note (Signed)
Refilled atorvastatin 10 mg daily.  He will come in soon for CPE.  Check lipid panel at that time.

## 2018-07-21 NOTE — Assessment & Plan Note (Signed)
Slightly above goal.  Will continue current dose of losartan-HCTZ 50-12.5 hlad tablet once daily.  Discussed importance of regular cardio exercise and healthy low-sodium diet.  Will recheck blood pressure at CPE in the next few weeks.

## 2018-07-21 NOTE — Assessment & Plan Note (Signed)
Omeprazole refilled.

## 2018-07-21 NOTE — Progress Notes (Signed)
Chief Complaint:  Mason Vargas is a 63 y.o. male who presents today for a virtual office visit with a chief complaint of essential hypertension and to establish care  Assessment/Plan:  Hyperlipidemia Refilled atorvastatin 10 mg daily.  He will come in soon for CPE.  Check lipid panel at that time.  Hypertension Slightly above goal.  Will continue current dose of losartan-HCTZ 50-12.5 hlad tablet once daily.  Discussed importance of regular cardio exercise and healthy low-sodium diet.  Will recheck blood pressure at CPE in the next few weeks.  GERD (gastroesophageal reflux disease) Omeprazole refilled.  ED (erectile dysfunction) Cialis refilled.     Subjective:  HPI:  His stable, chronic medical conditions are outlined below:  # Essential Hypertension  - On losartan 50-12.5 half a tablet once daily and tolerating well - ROS: No reported chest pain or shortness of breath  # Dyslipidemia - Was on atorvastatin 20mg  nightly and tolerating well. Is not on anything currently.  - ROS: No reported mylagias   # GERD - On omeprazole 20mg  and tolerating well  # Erectile Dysfunction - Has been on cialis 10mg  daily and has done well with that - Not on anything currently.   ROS: Per HPI, otherwise a complete review of systems was negative.   PMH:  The following were reviewed and entered/updated in epic: Past Medical History:  Diagnosis Date  . GERD (gastroesophageal reflux disease)   . Hyperlipidemia   . Hypertension   . Insomnia   . Lumbar herniated disc   . Sciatica 04/08/2018   Left leg  . Snores    Patient Active Problem List   Diagnosis Date Noted  . GERD (gastroesophageal reflux disease) 03/22/2018  . Hypertension 03/22/2018  . Lumbar herniated disc 03/22/2014  . BPH (benign prostatic hyperplasia) 03/03/2012  . Family history of ischemic heart disease (IHD) 08/10/2011  . Colon adenoma 06/11/2011  . Vitamin D deficiency 06/11/2011  . ED (erectile dysfunction)  06/02/2011  . Hyperlipidemia 03/22/2002   Past Surgical History:  Procedure Laterality Date  . CLOSED REDUCTION NASAL FRACTURE N/A 05/26/2018   Procedure: CLOSED REDUCTION NASAL FRACTURE;  Surgeon: Rozetta Nunnery, MD;  Location: [facility];  Service: ENT;  Laterality: N/A;  . COLONOSCOPY W/ POLYPECTOMY      Family History  Problem Relation Age of Onset  . Heart attack Father   . Heart Problems Father   . Hypertension Father   . Hyperlipidemia Father   . Colon cancer Neg Hx   . Pancreatic cancer Neg Hx   . Stomach cancer Neg Hx     Medications- reviewed and updated Current Outpatient Medications  Medication Sig Dispense Refill  . aspirin 81 MG tablet Take 81 mg by mouth daily.    Marland Kitchen losartan-hydrochlorothiazide (HYZAAR) 50-12.5 MG tablet Take 0.5 tablets by mouth daily. 90 tablet 3  . atorvastatin (LIPITOR) 20 MG tablet Take 1 tablet (20 mg total) by mouth at bedtime. 90 tablet 3  . omeprazole (PRILOSEC) 20 MG capsule Take 1 capsule (20 mg total) by mouth daily. 90 capsule 3  . tadalafil (CIALIS) 10 MG tablet Take 0.5-1 tablets (5-10 mg total) by mouth daily as needed for erectile dysfunction. 90 tablet 3   No current facility-administered medications for this visit.     Allergies-reviewed and updated No Known Allergies  Social History   Socioeconomic History  . Marital status: Married    Spouse name: Not on file  . Number of children: 0  .  Years of education: Not on file  . Highest education level: Some college, no degree  Occupational History  . Not on file  Social Needs  . Financial resource strain: Not on file  . Food insecurity    Worry: Not on file    Inability: Not on file  . Transportation needs    Medical: Not on file    Non-medical: Not on file  Tobacco Use  . Smoking status: Never Smoker  . Smokeless tobacco: Never Used  Substance and Sexual Activity  . Alcohol use: Yes    Alcohol/week: 1.0 standard drinks    Types: 1 Standard  drinks or equivalent per week    Comment: occasionally  . Drug use: No  . Sexual activity: Yes    Birth control/protection: None  Lifestyle  . Physical activity    Days per week: Not on file    Minutes per session: Not on file  . Stress: Not on file  Relationships  . Social Herbalist on phone: Not on file    Gets together: Not on file    Attends religious service: Not on file    Active member of club or organization: Not on file    Attends meetings of clubs or organizations: Not on file    Relationship status: Not on file  Other Topics Concern  . Not on file  Social History Narrative   Right handed    Caffeine 33 -4 cups daily           Objective/Observations  Physical Exam: Gen: NAD, resting comfortably Eyes: EOMI, no scleral icterus CV: No peripheral edema Resp: NWOB, speaking in full sentences. Pulm: Normal work of breathing Neuro: Grossly normal, moves all extremities Skin: No rashes or lesions Psych: Normal affect and thought content  Virtual Visit via Video   I connected with Mason Vargas on 07/21/18 at  3:40 PM EDT by a video enabled telemedicine application and verified that I am speaking with the correct person using two identifiers. I discussed the limitations of evaluation and management by telemedicine and the availability of in person appointments. The patient expressed understanding and agreed to proceed.   Patient location: Home Provider location: [place] participating in the virtual visit: Myself and Patient     [name]. [name], MD 07/21/2018 3:58 PM

## 2018-07-21 NOTE — Assessment & Plan Note (Signed)
Cialis refilled

## 2018-07-26 ENCOUNTER — Ambulatory Visit: Payer: Self-pay

## 2018-07-26 ENCOUNTER — Ambulatory Visit (HOSPITAL_COMMUNITY)
Admission: EM | Admit: 2018-07-26 | Discharge: 2018-07-26 | Disposition: A | Discharge status: Home / Self Care | Payer: BC Managed Care – PPO | Attending: Urgent Care | Admitting: Urgent Care

## 2018-07-26 ENCOUNTER — Other Ambulatory Visit: Payer: Self-pay

## 2018-07-26 ENCOUNTER — Encounter (HOSPITAL_COMMUNITY): Payer: Self-pay | Admitting: Emergency Medicine

## 2018-07-26 DIAGNOSIS — R232 Flushing: Secondary | ICD-10-CM

## 2018-07-26 DIAGNOSIS — R000 Tachycardia, unspecified: Secondary | ICD-10-CM

## 2018-07-26 DIAGNOSIS — R42 Dizziness and giddiness: Secondary | ICD-10-CM | POA: Diagnosis not present

## 2018-07-26 DIAGNOSIS — I10 Essential (primary) hypertension: Secondary | ICD-10-CM

## 2018-07-26 DIAGNOSIS — R519 Headache, unspecified: Secondary | ICD-10-CM

## 2018-07-26 MED ORDER — METOPROLOL TARTRATE 25 MG PO TABS: 25.0000 mg | ORAL_TABLET | Freq: Two times a day (BID) | ORAL | 0 refills | Status: DC

## 2018-07-26 NOTE — Telephone Encounter (Signed)
Patient is scheduled to see Inda Coke on 07/27/2018.

## 2018-07-26 NOTE — Telephone Encounter (Signed)
Please advise 

## 2018-07-26 NOTE — Telephone Encounter (Signed)
Patient called and says he's not feeling right in his body. He says yesterday he was walking the dog and felt flushed like he was about to pass out. He says he came home, was laying down and he was feeling some better. He says his BP last night was 160's-170's/90's and HR 105. He says he woke up feeling better, but the same thing happened again as he was outside doing some light work in the yard. He says he felt lightheaded. He says he feels worse when standing and walking around, feels fatigued, no fever, no other symptoms. I called the office and spoke to [place], [facility] who asked to speak to the patient, the call was connected successfully.  Answer Assessment - Initial Assessment Questions 1. DESCRIPTION: "Describe your dizziness."     Feeling flushed, not feeling quite right 2. LIGHTHEADED: "Do you feel lightheaded?" (e.g., somewhat faint, woozy, weak upon standing)     Yes when sitting 3. VERTIGO: "Do you feel like either you or the room is spinning or tilting?" (i.e. vertigo)     No 4. SEVERITY: "How bad is it?"  "Do you feel like you are going to faint?" "Can you stand and walk?"   - MILD - walking normally   - MODERATE - interferes with normal activities (e.g., work, school)    - SEVERE - unable to stand, requires support to walk, feels like passing out now.      Mild 5. ONSET:  "When did the dizziness begin?"     Sunday started feeling 6. AGGRAVATING FACTORS: "Does anything make it worse?" (e.g., standing, change in head position)     Standing, walking around 7. HEART RATE: "Can you tell me your heart rate?" "How many beats in 15 seconds?"  (Note: not all patients can do this)       99  8. CAUSE: "What do you think is causing the dizziness?"     I don't know 9. RECURRENT SYMPTOM: "Have you had dizziness before?" If so, ask: "When was the last time?" "What happened that time?"     No 10. OTHER SYMPTOMS: "Do you have any other symptoms?" (e.g., fever, chest pain, vomiting, diarrhea,  bleeding)     Fatigue 11. PREGNANCY: "Is there any chance you are pregnant?" "When was your last menstrual period?"      N/A  Protocols used: DIZZINESS [facility]

## 2018-07-26 NOTE — ED Provider Notes (Signed)
MRN: [id number] DOB: [birth year]  Subjective:   Mason Vargas is a 63 y.o. male presenting for 2-day history of intermittent dizziness, flushing sensation, heart racing and palpitations.  Patient reports that he recently restarted Cialis after a period of years not taking it.  States that he took a couple of doses Saturday and Sunday.  Symptoms coincided closely with starting medication.  He is also been checking his blood pressure and has been quite high in the 170s.  He has been working with his PCP on managing his blood pressure.  Was advised previously to take half a pill as it was making him dizzy as well.  Currently he reports a tension headache over posterior head into his neck.  Has not tried any medications for relief.  He continues to take his chronic medications including losartan-hydrochlorothiazide, atorvastatin and aspirin.  No current facility-administered medications for this encounter.   Current Outpatient Medications:  .  aspirin 81 MG tablet, Take 81 mg by mouth daily., Disp: , Rfl:  .  atorvastatin (LIPITOR) 20 MG tablet, Take 1 tablet (20 mg total) by mouth at bedtime., Disp: 90 tablet, Rfl: 3 .  losartan-hydrochlorothiazide (HYZAAR) 50-12.5 MG tablet, Take 0.5 tablets by mouth daily., Disp: 90 tablet, Rfl: 3 .  omeprazole (PRILOSEC) 20 MG capsule, Take 1 capsule (20 mg total) by mouth daily., Disp: 90 capsule, Rfl: 3 .  tadalafil (CIALIS) 10 MG tablet, Take 0.5-1 tablets (5-10 mg total) by mouth daily as needed for erectile dysfunction., Disp: 90 tablet, Rfl: 3   No Known Allergies  Past Medical History:  Diagnosis Date  . GERD (gastroesophageal reflux disease)   . Hyperlipidemia   . Hypertension   . Insomnia   . Lumbar herniated disc   . Sciatica 04/08/2018   Left leg  . Snores      Past Surgical History:  Procedure Laterality Date  . CLOSED REDUCTION NASAL FRACTURE N/A 05/26/2018   Procedure: CLOSED REDUCTION NASAL FRACTURE;  Surgeon: Rozetta Nunnery, MD;   Location: [facility];  Service: ENT;  Laterality: N/A;  . COLONOSCOPY W/ POLYPECTOMY      ROS Denies chest pain, vision changes, confusion, shortness of breath breath, wheezing, nausea, vomiting, abdominal pain.   Objective:   Vitals: BP 139/90 (BP Location: Right Arm)   Pulse (!) 112   Temp 97.9 F (36.6 C) (Oral)   Resp 16   SpO2 97%   Physical Exam Constitutional:      General: He is not in acute distress.    Appearance: Normal appearance. He is well-developed. He is not ill-appearing, toxic-appearing or diaphoretic.  HENT:     Head: Normocephalic and atraumatic.     Right Ear: External ear normal.     Left Ear: External ear normal.     Nose: Nose normal.     Mouth/Throat:     Mouth: Mucous membranes are moist.     Pharynx: Oropharynx is clear.  Eyes:     General: No scleral icterus.    Extraocular Movements: Extraocular movements intact.     Pupils: Pupils are equal, round, and reactive to light.  Cardiovascular:     Rate and Rhythm: Normal rate and regular rhythm.     Heart sounds: Normal heart sounds. No murmur. No friction rub. No gallop.   Pulmonary:     Effort: Pulmonary effort is normal. No respiratory distress.     Breath sounds: Normal breath sounds. No stridor. No wheezing, rhonchi or rales.  Neurological:  General: No focal deficit present.     Mental Status: He is alert and oriented to person, place, and time.     Cranial Nerves: No cranial nerve deficit.     Motor: No weakness.     Coordination: Coordination normal.     Gait: Gait normal.     Deep Tendon Reflexes: Reflexes normal.     Comments: Negative Romberg and pronator drift.  Psychiatric:        Mood and Affect: Mood normal.        Behavior: Behavior normal.        Thought Content: Thought content normal.     ED ECG REPORT   Date: 07/26/2018  Rate: 120bpm  Rhythm: sinus tachycardia  QRS Axis: normal  Intervals: normal  ST/T Wave abnormalities: normal  Conduction  Disutrbances:none  Narrative Interpretation: Sinus tachycardia at 120 bpm, very comparable to EKG from May 2020.  Old EKG Reviewed: unchanged  I have personally reviewed the EKG tracing and agree with the computerized printout as noted.   Assessment and Plan :   1. Dizziness   2. Essential hypertension   3. Flushing   4. Generalized headache   5. Tachycardia     Patient is to stop Cialis.  He is to maintain all his other chronic medications.  Given his tachycardia, will have him start metoprolol at a dose of 25 mg twice daily.  Recommended patient have close follow-up with his PCP.  Strict ER precautions reviewed. Counseled patient on potential for adverse effects with medications prescribed today, patient verbalized understanding.    Jaynee Eagles, [place] 07/26/18 7209

## 2018-07-26 NOTE — ED Triage Notes (Signed)
Per pt he was prescribed Cialis and first dose was taken on Saturday and took Sunday but Monday did not take bc he felt dizzy and flushed. Pt took pressure and were in the 170's over 90's pt has halved his blood pressure medication and wife advised to take other half. Pt said pressure went back down and was 114/70's this morning. Went walking today and felt the flush and dizziness again and pressure were high again. No dizziness now, no blurred vision, no tingling or numbness in hands or feet. Slight tension headache 1/10

## 2018-07-27 ENCOUNTER — Ambulatory Visit: Payer: BC Managed Care – PPO | Admitting: Physician Assistant

## 2018-07-27 NOTE — Telephone Encounter (Signed)
Pt went to ED yesterday

## 2018-08-17 ENCOUNTER — Encounter: Payer: Self-pay | Admitting: Family Medicine

## 2018-08-19 ENCOUNTER — Other Ambulatory Visit: Payer: Self-pay

## 2018-08-19 MED ORDER — METOPROLOL TARTRATE 25 MG PO TABS: 25.0000 mg | ORAL_TABLET | Freq: Two times a day (BID) | ORAL | 0 refills | Status: DC

## 2018-08-19 MED FILL — METOPROLOL TARTRATE 25 MG T: 25 | 30 days supply | Qty: 60 | Fill #0

## 2018-08-29 ENCOUNTER — Encounter: Payer: Self-pay | Admitting: Family Medicine

## 2018-08-29 ENCOUNTER — Ambulatory Visit (INDEPENDENT_AMBULATORY_CARE_PROVIDER_SITE_OTHER): Payer: BC Managed Care – PPO | Admitting: Family Medicine

## 2018-08-29 VITALS — BP 140/88

## 2018-08-29 DIAGNOSIS — I10 Essential (primary) hypertension: Secondary | ICD-10-CM

## 2018-08-29 DIAGNOSIS — R000 Tachycardia, unspecified: Secondary | ICD-10-CM

## 2018-08-29 DIAGNOSIS — N529 Male erectile dysfunction, unspecified: Secondary | ICD-10-CM | POA: Diagnosis not present

## 2018-08-29 MED ORDER — TADALAFIL 10 MG PO TABS: ORAL_TABLET | ORAL | 3 refills | Status: DC

## 2018-08-29 NOTE — Assessment & Plan Note (Signed)
At goal.  Continue losartan-HCTZ 50- 12.5 half tablet once daily.  Discussed home blood pressure monitoring with goal 140/90 or lower.  Follow-up in a few weeks.

## 2018-08-29 NOTE — Progress Notes (Signed)
    Chief Complaint:  Mason Vargas is a 64 y.o. male who presents today for a virtual office visit with a chief complaint of essential hypertension.   Assessment/Plan:  Tachycardia Symptoms from a few weeks ago with likely medication side effect.  Does not need to be on metoprolol long-term as he has no other underlying predisposition for tachycardia.  He is additionally noting some decreased exercise tolerance and more fatigue, likely side effects of metoprolol.  We will wean off over the next week.  I asked him to keep an eye on blood pressure and heart rate and let me know if they are persistently elevated.  He will follow-up me for CPE in the next 1 to 2 weeks.  Hypertension At goal.  Continue losartan-HCTZ 50- 12.5 half tablet once daily.  Discussed home blood pressure monitoring with goal 140/90 or lower.  Follow-up in a few weeks.  ED (erectile dysfunction) Doing much better with Cialis 2.5 mg daily.  He will continue taking this dose.    Subjective:  HPI:  Patient was seen about 6 weeks ago for establish care visit.  At that time we restarted his medications including Lipitor 20 mg daily, losartan-HCTZ 50-12.5mg  half tablet once daily, Prilosec 20 mg daily, and Cialis 10 mg daily.  Unfortunately about a week after starting his medications had an adverse reaction to Cialis.  Symptoms at that time include dizziness, elevated heart rate, and palpitations.  He was seen in urgent care at that time.  They recommended stopping Cialis.  He was also started on metoprolol for his tachycardia.  Over the last few weeks, symptoms have significantly improved.  No longer having elevated blood pressure readings or palpitations.  He has been using quarter dose of Cialis which seems to be effective without any adverse reactions.  No nausea or vomiting.  His stable, chronic medical conditions are outlined below:  # Essential Hypertension  - On losartan 50-12.5 half a tablet once daily and tolerating well  - ROS: No reported chest pain or shortness of breath  # Dyslipidemia - On atorvastatin 20mg  nightly and tolerating well - ROS: No reported mylagias   # GERD - On omeprazole 20mg  and tolerating well  # Erectile Dysfunction - On cialis 2.5mg  daily and tolerating well   ROS: Per HPI  PMH: He reports that he has never smoked. He has never used smokeless tobacco. He reports current alcohol use of about 1.0 standard drinks of alcohol per week. He reports that he does not use drugs.      Objective/Observations  Physical Exam: BP 140/88  Gen: NAD, resting comfortably Pulm: Normal work of breathing Neuro: Grossly normal, moves all extremities Psych: Normal affect and thought content  Virtual Visit via Video   I connected with Mason Vargas on 08/29/18 at 11:20 AM EDT by a video enabled telemedicine application and verified that I am speaking with the correct person using two identifiers. I discussed the limitations of evaluation and management by telemedicine and the availability of in person appointments. The patient expressed understanding and agreed to proceed.   Patient location: Home Provider location: [place] participating in the virtual visit: Myself and Patient     Mason Vargas. [name], MD 08/29/2018 11:41 AM

## 2018-08-29 NOTE — Assessment & Plan Note (Signed)
Doing much better with Cialis 2.5 mg daily.  He will continue taking this dose.

## 2018-09-02 MED FILL — TRIAMCINOLONE 0.1% OINTMENT: 0.1 | 14 days supply | Qty: 80 | Fill #1

## 2018-09-05 ENCOUNTER — Encounter: Payer: Self-pay | Admitting: Family Medicine

## 2018-09-13 ENCOUNTER — Other Ambulatory Visit: Payer: Self-pay

## 2018-09-13 ENCOUNTER — Other Ambulatory Visit (INDEPENDENT_AMBULATORY_CARE_PROVIDER_SITE_OTHER): Payer: BC Managed Care – PPO

## 2018-09-13 DIAGNOSIS — E785 Hyperlipidemia, unspecified: Secondary | ICD-10-CM

## 2018-09-13 DIAGNOSIS — M62559 Muscle wasting and atrophy, not elsewhere classified, unspecified thigh: Secondary | ICD-10-CM | POA: Insufficient documentation

## 2018-09-13 LAB — LIPID PANEL
Cholesterol: 107 mg/dL (ref 0–200)
HDL: 33.3 mg/dL — ABNORMAL LOW (ref >39.00)
LDL Cholesterol: 50 mg/dL (ref 0–99)
NonHDL: 73.29
Total CHOL/HDL Ratio: 3
Triglycerides: 114 mg/dL (ref 0.0–149.0)
VLDL: 22.8 mg/dL (ref 0.0–40.0)

## 2018-09-13 NOTE — Progress Notes (Signed)
Please inform patient of the following:  His "good" cholesterol is a bit low but overall his numbers are GREAT. Would like for him to keep up the good work and we can recheck in a year or so.  Mason Vargas. [name], MD 09/13/2018 1:07 PM

## 2018-09-17 ENCOUNTER — Ambulatory Visit (HOSPITAL_COMMUNITY)
Admission: EM | Admit: 2018-09-17 | Discharge: 2018-09-17 | Disposition: A | Discharge status: Home / Self Care | Payer: BC Managed Care – PPO | Attending: Radiology | Admitting: Radiology

## 2018-09-17 ENCOUNTER — Encounter (HOSPITAL_COMMUNITY): Payer: Self-pay

## 2018-09-17 DIAGNOSIS — M5432 Sciatica, left side: Secondary | ICD-10-CM | POA: Diagnosis not present

## 2018-09-17 MED ORDER — DICLOFENAC SODIUM 75 MG PO TBEC: 75.0000 mg | DELAYED_RELEASE_TABLET | Freq: Two times a day (BID) | ORAL | 0 refills | Status: AC

## 2018-09-17 MED ORDER — KETOROLAC TROMETHAMINE 30 MG/ML IJ SOLN
INTRAMUSCULAR | Status: AC
  Filled 2018-09-17: qty 1

## 2018-09-17 MED ORDER — CYCLOBENZAPRINE HCL 10 MG PO TABS: 10.0000 mg | ORAL_TABLET | Freq: Two times a day (BID) | ORAL | 0 refills | Status: DC | PRN

## 2018-09-17 MED ORDER — KETOROLAC TROMETHAMINE 30 MG/ML IJ SOLN
30.0000 mg | Freq: Once | INTRAMUSCULAR | Status: AC
  Administered 2018-09-17: 30 mg via INTRAMUSCULAR

## 2018-09-17 NOTE — ED Provider Notes (Addendum)
[facility]    CSN: [id number] Arrival date & time: 09/17/18  1041      History   Chief Complaint Chief Complaint  Patient presents with  . Back Pain  . Leg Pain    left    HPI Mason Vargas is a 63 y.o. male.   HPI   63 y.o. male presents with back and left leg pain X 2 days.  Patient has a history of back pain and describes sciatica which he has been followed by OT in the past. Patient states that he was "agressively cleaning the tires" of his car and the car wash yesterday and he agggravated his back. Patient describes "numbess" in his left thigh radiating down his knee and states that when attempts to stand his "leg locks up"Condition is acute on chronic in nature. Condition is made better by nothing Condition is made worse by movement of left extremity and standing. Patient denies any treatment prior to there arrival at this facility. Patient denies trauma or loss of bladder or bowel.    Past Medical History:  Diagnosis Date  . GERD (gastroesophageal reflux disease)   . Hyperlipidemia   . Hypertension   . Insomnia   . Lumbar herniated disc   . Sciatica 04/08/2018   Left leg  . Snores     Patient Active Problem List   Diagnosis Date Noted  . GERD (gastroesophageal reflux disease) 03/22/2018  . Hypertension 03/22/2018  . Lumbar herniated disc 03/22/2014  . BPH (benign prostatic hyperplasia) 03/03/2012  . Family history of ischemic heart disease (IHD) 08/10/2011  . Colon adenoma 06/11/2011  . Vitamin D deficiency 06/11/2011  . ED (erectile dysfunction) 06/02/2011  . Hyperlipidemia 03/22/2002    Past Surgical History:  Procedure Laterality Date  . CLOSED REDUCTION NASAL FRACTURE N/A 05/26/2018   Procedure: CLOSED REDUCTION NASAL FRACTURE;  Surgeon: Rozetta Nunnery, MD;  Location: [place];  Service: ENT;  Laterality: N/A;  . COLONOSCOPY W/ POLYPECTOMY         Home Medications    Prior to Admission medications   Medication  Sig Start Date End Date Taking? Authorizing Provider  aspirin 81 MG tablet Take 81 mg by mouth daily.    [provider]  atorvastatin (LIPITOR) 20 MG tablet Take 1 tablet (20 mg total) by mouth at bedtime. 07/21/18   Vivi Barrack, MD  losartan-hydrochlorothiazide [facility]) 50-12.5 MG tablet Take 0.5 tablets by mouth daily. 07/21/18   Vivi Barrack, MD  metoprolol tartrate (LOPRESSOR) 25 MG tablet Take 1 tablet (25 mg total) by mouth 2 (two) times daily. 08/19/18   Vivi Barrack, MD  omeprazole (PRILOSEC) 20 MG capsule Take 1 capsule (20 mg total) by mouth daily. 07/21/18   Vivi Barrack, MD  tadalafil (CIALIS) 10 MG tablet Take 2.5mg  as needed. 08/29/18   Vivi Barrack, MD    Family History Family History  Problem Relation Age of Onset  . Heart attack Father   . Heart Problems Father   . Hypertension Father   . Hyperlipidemia Father   . Colon cancer Neg Hx   . Pancreatic cancer Neg Hx   . Stomach cancer Neg Hx     Social History Social History   Tobacco Use  . Smoking status: Never Smoker  . Smokeless tobacco: Never Used  Substance Use Topics  . Alcohol use: Yes    Alcohol/week: 1.0 standard drinks    Types: 1 Standard drinks or equivalent per  week    Comment: occasionally  . Drug use: No     Allergies   Patient has no known allergies.   Review of Systems Review of Systems  Musculoskeletal:        Left leg pain and numbness     Physical Exam Triage Vital Signs ED Triage Vitals [09/17/18 1114]  Enc Vitals Group     BP 129/82     Pulse Rate (!) 116     Resp 16     Temp 98.9 F (37.2 C)     Temp Source Oral     SpO2 99 %     Weight      Height      Head Circumference      Peak Flow      Pain Score 5     Pain Loc      Pain Edu?      Excl. in [place]?    No data found.  Updated Vital Signs BP 129/82 (BP Location: Left Arm)   Pulse (!) 116   Temp 98.9 F (37.2 C) (Oral)   Resp 16   SpO2 99%   Visual Acuity Right Eye Distance:   Left Eye  Distance:   Bilateral Distance:    Right Eye Near:   Left Eye Near:    Bilateral Near:     Physical Exam Vitals signs and nursing note reviewed.  Constitutional:      Appearance: He is well-developed.  HENT:     Head: Normocephalic.  Neck:     Musculoskeletal: Normal range of motion.  Pulmonary:     Effort: Pulmonary effort is normal.  Musculoskeletal:     Comments:  Unable to flex left leg.   Skin:    General: Skin is dry.  Neurological:     Mental Status: He is alert and oriented to person, place, and time.      UC Treatments / Results  Labs (all labs ordered are listed, but only abnormal results are displayed) Labs Reviewed - No data to display  EKG   Radiology No results found.  Procedures Procedures (including critical care time)  Medications Ordered in UC Medications  ketorolac (TORADOL) 30 MG/ML injection 30 mg (has no administration in time range)    Initial Impression / Assessment and Plan / UC Course  I have reviewed the triage vital signs and the nursing notes.  Pertinent labs & imaging results that were available during my care of the patient were reviewed by me and considered in my medical decision making (see chart for details).      Final Clinical Impressions(s) / UC Diagnoses   Final diagnoses:  None   Discharge Instructions   None    ED Prescriptions    None     Controlled Substance Prescriptions [place] Controlled Substance Registry consulted? Not Applicable   Jacqualine Mau, NP 09/17/18 1125    Jacqualine Mau, NP 09/17/18 [phone]

## 2018-09-17 NOTE — ED Triage Notes (Addendum)
Pt present lower back pain, with left leg numbness and not able to move in certain direction.  Pt states he was having trouble standing this morning his left leg gave out on him as he tried to get out of bed.

## 2018-09-20 ENCOUNTER — Encounter: Payer: Self-pay | Admitting: Family Medicine

## 2018-09-20 ENCOUNTER — Ambulatory Visit (INDEPENDENT_AMBULATORY_CARE_PROVIDER_SITE_OTHER): Payer: BC Managed Care – PPO | Admitting: Family Medicine

## 2018-09-20 DIAGNOSIS — M5442 Lumbago with sciatica, left side: Secondary | ICD-10-CM | POA: Diagnosis not present

## 2018-09-20 MED ORDER — TRAMADOL HCL 50 MG PO TABS: 50.0000 mg | ORAL_TABLET | Freq: Three times a day (TID) | ORAL | 0 refills | Status: AC | PRN

## 2018-09-20 MED ORDER — PREDNISONE 50 MG PO TABS: ORAL_TABLET | ORAL | 0 refills | Status: DC

## 2018-09-20 NOTE — Progress Notes (Signed)
    Chief Complaint:  Mason Vargas is a 63 y.o. male who presents today for a virtual office visit with a chief complaint of sciatica.   Assessment/Plan:  Sciatica Pain is poorly controlled.  Start prednisone burst.  Will send in small supply of tramadol to use as needed.  Discussed home exercises.  Reports referral to neurosurgery per request.  Discussed reasons to return to care.    Subjective:  HPI:  Sciatica  Patient seen in the UC 3 days ago for 2 days of back and leg pain. He was diagnosed with sciatica and given an injection of toradol. He has not had any significant change in his symptoms since then. Associated with numbness radiating into his left thigh and knee. No reported bowel or bladder symptoms. No other weakness or numbness. No other obvious alleviating or aggravating factors.   ROS: Per HPI  PMH: He reports that he has never smoked. He has never used smokeless tobacco. He reports current alcohol use of about 1.0 standard drinks of alcohol per week. He reports that he does not use drugs.      Objective/Observations  Physical Exam: Gen: NAD, resting comfortably Pulm: Normal work of breathing Neuro: Grossly normal, moves all extremities Psych: Normal affect and thought content  Virtual Visit via Video   I connected with Mason Vargas on 09/20/18 at 11:00 AM EDT by a video enabled telemedicine application and verified that I am speaking with the correct person using two identifiers. I discussed the limitations of evaluation and management by telemedicine and the availability of in person appointments. The patient expressed understanding and agreed to proceed.   Patient location: Home Provider location: [place] participating in the virtual visit: Myself and Patient     [name]. [name], MD 09/20/2018 11:31 AM

## 2018-09-21 ENCOUNTER — Encounter: Payer: Self-pay | Admitting: Family Medicine

## 2018-09-23 ENCOUNTER — Encounter: Payer: Self-pay | Admitting: Family Medicine

## 2018-09-23 ENCOUNTER — Other Ambulatory Visit (HOSPITAL_COMMUNITY): Payer: Self-pay | Admitting: Neurosurgery

## 2018-09-23 ENCOUNTER — Other Ambulatory Visit: Payer: Self-pay | Admitting: Neurosurgery

## 2018-09-23 DIAGNOSIS — M5442 Lumbago with sciatica, left side: Secondary | ICD-10-CM | POA: Diagnosis not present

## 2018-09-23 DIAGNOSIS — M5416 Radiculopathy, lumbar region: Secondary | ICD-10-CM

## 2018-09-23 DIAGNOSIS — M545 Low back pain: Secondary | ICD-10-CM | POA: Diagnosis not present

## 2018-09-23 DIAGNOSIS — R29898 Other symptoms and signs involving the musculoskeletal system: Secondary | ICD-10-CM | POA: Diagnosis not present

## 2018-09-27 ENCOUNTER — Ambulatory Visit (HOSPITAL_COMMUNITY): Payer: BC Managed Care – PPO

## 2018-09-27 DIAGNOSIS — M5127 Other intervertebral disc displacement, lumbosacral region: Secondary | ICD-10-CM | POA: Diagnosis not present

## 2018-09-27 DIAGNOSIS — M48061 Spinal stenosis, lumbar region without neurogenic claudication: Secondary | ICD-10-CM | POA: Diagnosis not present

## 2018-09-27 DIAGNOSIS — M5416 Radiculopathy, lumbar region: Secondary | ICD-10-CM | POA: Diagnosis not present

## 2018-09-29 MED FILL — OMEPRAZOLE 20 MG CAPSULE DR: 20 | 90 days supply | Qty: 90 | Fill #1

## 2018-10-06 DIAGNOSIS — M5116 Intervertebral disc disorders with radiculopathy, lumbar region: Secondary | ICD-10-CM | POA: Diagnosis not present

## 2018-10-18 MED FILL — ATORVASTATIN 20 MG TABLET: 20 | 90 days supply | Qty: 90 | Fill #1

## 2018-10-31 ENCOUNTER — Other Ambulatory Visit: Payer: Self-pay

## 2018-10-31 ENCOUNTER — Ambulatory Visit: Payer: BC Managed Care – PPO | Attending: Neurosurgery

## 2018-10-31 DIAGNOSIS — M6281 Muscle weakness (generalized): Secondary | ICD-10-CM | POA: Diagnosis present

## 2018-10-31 DIAGNOSIS — M5416 Radiculopathy, lumbar region: Secondary | ICD-10-CM | POA: Diagnosis present

## 2018-10-31 DIAGNOSIS — M62830 Muscle spasm of back: Secondary | ICD-10-CM | POA: Diagnosis present

## 2018-10-31 DIAGNOSIS — M5441 Lumbago with sciatica, right side: Secondary | ICD-10-CM | POA: Insufficient documentation

## 2018-10-31 DIAGNOSIS — R2689 Other abnormalities of gait and mobility: Secondary | ICD-10-CM | POA: Diagnosis present

## 2018-10-31 DIAGNOSIS — R262 Difficulty in walking, not elsewhere classified: Secondary | ICD-10-CM | POA: Diagnosis present

## 2018-10-31 NOTE — Therapy (Signed)
Mason Vargas, [place], [address] Phone: [phone]   Fax:  [phone]  Physical Therapy Evaluation  Patient Details  Name: Mason Vargas MRN: [id number] Date of Birth: 1955-09-05 Referring Provider (PT): Newman Pies, MD   Encounter Date: 10/31/2018  PT End of Session - 10/31/18 1210    Visit Number  1    Number of Visits  16    Date for PT Re-Evaluation  12/22/18    Authorization Type  BCBS    PT Start Time  0915    PT Stop Time  1000    PT Time Calculation (min)  45 min    Activity Tolerance  Patient tolerated treatment well    Behavior During Therapy  [facility] for tasks assessed/performed       Past Medical History:  Diagnosis Date  . GERD (gastroesophageal reflux disease)   . Hyperlipidemia   . Hypertension   . Insomnia   . Lumbar herniated disc   . Sciatica 04/08/2018   Left leg  . Snores     Past Surgical History:  Procedure Laterality Date  . CLOSED REDUCTION NASAL FRACTURE N/A 05/26/2018   Procedure: CLOSED REDUCTION NASAL FRACTURE;  Surgeon: Rozetta Nunnery, MD;  Location: [place];  Service: ENT;  Laterality: N/A;  . COLONOSCOPY W/ POLYPECTOMY      There were no vitals filed for this visit.   Subjective Assessment - 10/31/18 0925    Subjective  He reports  now L3-4 disc herniation post surgery.    MD said it was 9.5 on scale of 10 .   He reports back is better with less pressure but tightens up with walking. .   LT leg having probles  with weakness and numb to distal thigh. Less stability.  Tripped an fell.    Limitations  Walking;House hold activities   stairs   How long can you sit comfortably?  firm chair 60 min or more    How long can you stand comfortably?  tens to weight bear on Rt    60 min    How long can you walk comfortably?  1/4  mile    Patient Stated Goals  Improve strength and walking.    Currently in Pain?  No/denies         [facility] PT  Assessment - 10/31/18 0001      Assessment   Medical Diagnosis  disc herniation post laminectomy    Referring Provider (PT)  Newman Pies, MD    Onset Date/Surgical Date  --   6 weeks ago.    Next MD Visit  02/2019    Prior Therapy  Yes march 2020      Precautions   Precautions  None    Precaution Comments  take it easy.       Restrictions   Weight Bearing Restrictions  No      Balance Screen   Has the patient fallen in the past 6 months  Yes    How many times?  1   tripped   Has the patient had a decrease in activity level because of a fear of falling?   Yes    Is the patient reluctant to leave their home because of a fear of falling?   No      Prior Function   Level of Independence  Requires assistive device for independence      Cognition   Overall Cognitive Status  Within  Functional Limits for tasks assessed      Posture/Postural Control   Posture Comments  weight to RT   decr lumbar lordosis      ROM / Strength   AROM / PROM / Strength  AROM;Strength      AROM   AROM Assessment Site  Lumbar    Lumbar Flexion  40    Lumbar Extension  15    Lumbar - Right Side Bend  15    Lumbar - Left Side Bend  10      Strength   Strength Assessment Site  Hip;Knee;Ankle    Right/Left Hip  Left    Left Hip Flexion  3+/5    Left Hip Extension  3+/5    Left Hip External Rotation  4/5   RT4/5   Left Hip Internal Rotation  5/5    Left Hip ABduction  4/5    Left Hip ADduction  4+/5    Right/Left Knee  Left    Left Knee Flexion  5/5    Left Knee Extension  3+/5    Right/Left Ankle  Left    Left Ankle Dorsiflexion  5/5    Left Ankle Plantar Flexion  5/5    Left Ankle Inversion  5/5    Left Ankle Eversion  5/5                Objective measurements completed on examination: See above findings.              PT Education - 10/31/18 0918    Education Details  POC    Person(s) Educated  Patient    Methods  Explanation    Comprehension  Verbalized  understanding       PT Short Term Goals - 10/31/18 1047      PT SHORT TERM GOAL #1   Title  He will report 40% or more improvement with back pain    Time  4    Period  Weeks    Status  New      PT SHORT TERM GOAL #2   Title  Lt quad will improve to 4/5 strength.,    Time  4    Period  Weeks    Status  New      PT SHORT TERM GOAL #3   Title  He will increase his walking by 25%    Time  4    Period  Weeks    Status  New      PT SHORT TERM GOAL #4   Title  He will report improved ability to walk steps with one rail step over step    Time  4    Period  Weeks    Status  New        PT Long Term Goals - 10/31/18 [id number]      PT LONG TERM GOAL #1   Title  He will be independent with all HEP issued    Time  8    Period  Weeks    Status  New      PT LONG TERM GOAL #2   Title  Patient will sit for 2 hours without self report of pain in order to perfrom work tasks    Time  8    Period  Weeks    Status  New      PT LONG TERM GOAL #3   Title  Patient will demonstrate a 32% limitation on FOTO  Time  8    Period  Weeks    Status  New      PT LONG TERM GOAL #4   Title  He will walk with no device for all activities.    Time  8    Period  Weeks    Status  New      PT LONG TERM GOAL #5   Title  He will report pain max 1-2/10 with all work and home tasks    Time  8    Period  Weeks    Status  New             Plan - 10/31/18 1043    Clinical Impression Statement  MR [place] presents post surgery for discectomy LT L3-4 with residual intermittant pain.  LT thigh numbness improved but continues. and weakness of LT quads and hip limiting activity on feet.  He should improved with skilled PT and consistent HEP.    Personal Factors and Comorbidities  Past/Current Experience    Examination-Activity Limitations  Lift;Stand;Locomotion Level;Bend;Sit;Carry;Stairs    Examination-Participation Restrictions  Yard Work;Cleaning;Community Activity   work   Copy  Evolving/Moderate complexity    Clinical Decision Making  Moderate    Rehab Potential  Good    PT Frequency  2x / week    PT Duration  8 weeks    PT Treatment/Interventions  Electrical Stimulation;Moist Heat;Cryotherapy;Stair training;Gait training;Therapeutic exercise;Therapeutic activities;Neuromuscular re-education;Patient/family education;Manual techniques;Passive range of motion;Dry needling;Taping    PT Next Visit Plan  review HEP and progress , bike or nustep, strengthening    PT Home Exercise Plan  LAQ , side hip abduction , prone hip ext    Consulted and Agree with Plan of Care  Patient       Patient will benefit from skilled therapeutic intervention in order to improve the following deficits and impairments:  Pain, Postural dysfunction, Increased muscle spasms, Decreased activity tolerance, Decreased range of motion, Decreased strength, Difficulty walking  Visit Diagnosis: Radiculopathy, lumbar region  Muscle spasm of back  Other abnormalities of gait and mobility  Muscle weakness (generalized)  Difficulty in walking, not elsewhere classified     Problem List Patient Active Problem List   Diagnosis Date Noted  . GERD (gastroesophageal reflux disease) 03/22/2018  . Hypertension 03/22/2018  . Lumbar herniated disc 03/22/2014  . BPH (benign prostatic hyperplasia) 03/03/2012  . Family history of ischemic heart disease (IHD) 08/10/2011  . Colon adenoma 06/11/2011  . Vitamin D deficiency 06/11/2011  . ED (erectile dysfunction) 06/02/2011  . Hyperlipidemia 03/22/2002    Darrel Hoover PT 10/31/2018, 12:58 PM  [facility] [address] [place], [place], [address] Phone: [phone]   Fax:  [phone]  Name: QURON KELTY MRN: [id number] Date of Birth: [birth year]

## 2018-11-08 ENCOUNTER — Encounter: Payer: Self-pay | Admitting: Physical Therapy

## 2018-11-08 ENCOUNTER — Other Ambulatory Visit: Payer: Self-pay

## 2018-11-08 ENCOUNTER — Ambulatory Visit: Payer: BC Managed Care – PPO | Admitting: Physical Therapy

## 2018-11-08 DIAGNOSIS — M62830 Muscle spasm of back: Secondary | ICD-10-CM

## 2018-11-08 DIAGNOSIS — M6281 Muscle weakness (generalized): Secondary | ICD-10-CM

## 2018-11-08 DIAGNOSIS — R262 Difficulty in walking, not elsewhere classified: Secondary | ICD-10-CM

## 2018-11-08 DIAGNOSIS — R2689 Other abnormalities of gait and mobility: Secondary | ICD-10-CM

## 2018-11-08 DIAGNOSIS — M5441 Lumbago with sciatica, right side: Secondary | ICD-10-CM

## 2018-11-08 DIAGNOSIS — M5416 Radiculopathy, lumbar region: Secondary | ICD-10-CM

## 2018-11-08 MED FILL — LOSARTAN-HCTZ 50-12.5 MG TA: 50-12.5 | 90 days supply | Qty: 45 | Fill #1

## 2018-11-08 NOTE — Therapy (Signed)
[place], [place], [address] Phone: [phone]   Fax:  [phone]  Physical Therapy Treatment  Patient Details  Name: Mason Vargas MRN: [id number] Date of Birth: 1955-11-23 Referring Provider (PT): Newman Pies, MD   Encounter Date: 11/08/2018  PT End of Session - 11/08/18 0837    Visit Number  2    Number of Visits  16    Date for PT Re-Evaluation  12/22/18    Authorization Type  BCBS    PT Start Time  0800    PT Stop Time  0845    PT Time Calculation (min)  45 min       Past Medical History:  Diagnosis Date  . GERD (gastroesophageal reflux disease)   . Hyperlipidemia   . Hypertension   . Insomnia   . Lumbar herniated disc   . Sciatica 04/08/2018   Left leg  . Snores     Past Surgical History:  Procedure Laterality Date  . CLOSED REDUCTION NASAL FRACTURE N/A 05/26/2018   Procedure: CLOSED REDUCTION NASAL FRACTURE;  Surgeon: Rozetta Nunnery, MD;  Location: [place];  Service: ENT;  Laterality: N/A;  . COLONOSCOPY W/ POLYPECTOMY      There were no vitals filed for this visit.  Subjective Assessment - 11/08/18 0803    Subjective  Pt reports decreased numbness that is not traveling as far down his leg, centered at anterior knee. Thigh is no longer numb.    Currently in Pain?  No/denies                       [facility] Adult PT Treatment/Exercise - 11/08/18 0001      Lumbar Exercises: Stretches   Hip Flexor Stretch  1 rep;30 seconds    Hip Flexor Stretch Limitations  modified thomas     Other Lumbar Stretch Exercise  butterfly stretch x 30 sec       Lumbar Exercises: Aerobic   Nustep  L5 UE/LE x 5 minutes       Lumbar Exercises: Seated   Long Arc Quad on Chair  20 reps    LAQ on Chair Limitations  left       Lumbar Exercises: Supine   Glut Set  20 reps      Lumbar Exercises: Sidelying   Hip Abduction  20 reps      Knee/Hip Exercises: Supine    Quad Sets  20 reps    Quad Sets Limitations  5-10 seconds     Short Arc Quad Sets  20 reps      Knee/Hip Exercises: Prone   Hip Extension  15 reps    Hip Extension Limitations  with initial prone quad set                PT Short Term Goals - 10/31/18 1047      PT SHORT TERM GOAL #1   Title  He will report 40% or more improvement with back pain    Time  4    Period  Weeks    Status  New      PT SHORT TERM GOAL #2   Title  Lt quad will improve to 4/5 strength.,    Time  4    Period  Weeks    Status  New      PT SHORT TERM GOAL #3   Title  He will increase his walking by 25%  Time  4    Period  Weeks    Status  New      PT SHORT TERM GOAL #4   Title  He will report improved ability to walk steps with one rail step over step    Time  4    Period  Weeks    Status  New        PT Long Term Goals - 10/31/18 [id number]      PT LONG TERM GOAL #1   Title  He will be independent with all HEP issued    Time  8    Period  Weeks    Status  New      PT LONG TERM GOAL #2   Title  Patient will sit for 2 hours without self report of pain in order to perfrom work tasks    Time  8    Period  Weeks    Status  New      PT LONG TERM GOAL #3   Title  Patient will demonstrate a 32% limitation on FOTO    Time  8    Period  Weeks    Status  New      PT LONG TERM GOAL #4   Title  He will walk with no device for all activities.    Time  8    Period  Weeks    Status  New      PT LONG TERM GOAL #5   Title  He will report pain max 1-2/10 with all work and home tasks    Time  [place] - 11/08/18 [id number]    Clinical Impression Statement  Pt reports consistency with HEP thus far. Reviewed HEP and progressed therex to increased LE strength with gentle core activation. No increased pain with therex, only fatigue in LLE. Pt requested additional HEP exercises. Also added groin and hip flexor stretch as he reports feeling some strain  in this area.    PT Next Visit Plan  review HEP and progress , bike or nustep, strengthening    PT Home Exercise Plan  LAQ , side hip abduction , prone hip ext, mini bridge, SAQ, modified thomas, butterfly stretch supine       Patient will benefit from skilled therapeutic intervention in order to improve the following deficits and impairments:  Pain, Postural dysfunction, Increased muscle spasms, Decreased activity tolerance, Decreased range of motion, Decreased strength, Difficulty walking  Visit Diagnosis: Radiculopathy, lumbar region  Muscle spasm of back  Other abnormalities of gait and mobility  Muscle weakness (generalized)  Difficulty in walking, not elsewhere classified  Acute left-sided low back pain with right-sided sciatica     Problem List Patient Active Problem List   Diagnosis Date Noted  . GERD (gastroesophageal reflux disease) 03/22/2018  . Hypertension 03/22/2018  . Lumbar herniated disc 03/22/2014  . BPH (benign prostatic hyperplasia) 03/03/2012  . Family history of ischemic heart disease (IHD) 08/10/2011  . Colon adenoma 06/11/2011  . Vitamin D deficiency 06/11/2011  . ED (erectile dysfunction) 06/02/2011  . Hyperlipidemia 03/22/2002    Dorene Ar, PTA 11/08/2018, 8:56 AM  [facility] [address] [place], [place], [address] Phone: [phone]   Fax:  [phone]  Name: Mason Vargas MRN: [id number] Date of Birth: 07/04/1955

## 2018-11-11 ENCOUNTER — Other Ambulatory Visit: Payer: Self-pay

## 2018-11-11 ENCOUNTER — Ambulatory Visit: Payer: BC Managed Care – PPO | Admitting: Physical Therapy

## 2018-11-11 ENCOUNTER — Encounter: Payer: Self-pay | Admitting: Physical Therapy

## 2018-11-11 DIAGNOSIS — R262 Difficulty in walking, not elsewhere classified: Secondary | ICD-10-CM

## 2018-11-11 DIAGNOSIS — M6281 Muscle weakness (generalized): Secondary | ICD-10-CM

## 2018-11-11 DIAGNOSIS — R2689 Other abnormalities of gait and mobility: Secondary | ICD-10-CM

## 2018-11-11 DIAGNOSIS — M5441 Lumbago with sciatica, right side: Secondary | ICD-10-CM

## 2018-11-11 DIAGNOSIS — M62830 Muscle spasm of back: Secondary | ICD-10-CM

## 2018-11-11 DIAGNOSIS — M5416 Radiculopathy, lumbar region: Secondary | ICD-10-CM

## 2018-11-11 NOTE — Therapy (Signed)
[place], [place], [address] Phone: [phone]   Fax:  [phone]  Physical Therapy Treatment  Patient Details  Name: Mason Vargas MRN: [id number] Date of Birth: 1955/08/01 Referring Provider (PT): Newman Pies, MD   Encounter Date: 11/11/2018  PT End of Session - 11/11/18 0850    Visit Number  3    Number of Visits  16    Date for PT Re-Evaluation  12/22/18    Authorization Type  BCBS    PT Start Time  0845    PT Stop Time  0930    PT Time Calculation (min)  45 min       Past Medical History:  Diagnosis Date  . GERD (gastroesophageal reflux disease)   . Hyperlipidemia   . Hypertension   . Insomnia   . Lumbar herniated disc   . Sciatica 04/08/2018   Left leg  . Snores     Past Surgical History:  Procedure Laterality Date  . CLOSED REDUCTION NASAL FRACTURE N/A 05/26/2018   Procedure: CLOSED REDUCTION NASAL FRACTURE;  Surgeon: Rozetta Nunnery, MD;  Location: [place];  Service: ENT;  Laterality: N/A;  . COLONOSCOPY W/ POLYPECTOMY      There were no vitals filed for this visit.  Subjective Assessment - 11/11/18 0850    Subjective  No increased pain or soreness after last session. I am sore today.    Currently in Pain?  No/denies                       [facility] Adult PT Treatment/Exercise - 11/11/18 0001      Lumbar Exercises: Stretches   Active Hamstring Stretch  3 reps;30 seconds    Active Hamstring Stretch Limitations  seated     Hip Flexor Stretch  1 rep;30 seconds    Hip Flexor Stretch Limitations  modified thomas     Gastroc Stretch  1 rep;60 seconds    Gastroc Stretch Limitations  seated with strap    Other Lumbar Stretch Exercise  butterfly stretch x 30 sec  bilat and single leg       Lumbar Exercises: Aerobic   Recumbent Bike  L1 x  5 minutes      Lumbar Exercises: Seated   Long Arc Quad on Chair  20 reps    LAQ on Chair Limitations   left      Lumbar Exercises: Supine   Glut Set  --    Bridge  20 reps    Bridge Limitations  mini      Lumbar Exercises: Sidelying   Hip Abduction  20 reps    Other Sidelying Lumbar Exercises  hip adduction x 10       Knee/Hip Exercises: Supine   Quad Sets  20 reps    Quad Sets Limitations  5-10 seconds    Short Arc [employer]  20 reps    Short Arc Quad Sets Limitations  2#    Straight Leg Raises  10 reps    Straight Leg Raises Limitations  with initial quad set              PT Education - 11/11/18 [phone]    Education Details  HEP    Person(s) Educated  Patient    Methods  Explanation;Handout    Comprehension  Verbalized understanding       PT Short Term Goals - 10/31/18 1047  PT SHORT TERM GOAL #1   Title  He will report 40% or more improvement with back pain    Time  4    Period  Weeks    Status  New      PT SHORT TERM GOAL #2   Title  Lt quad will improve to 4/5 strength.,    Time  4    Period  Weeks    Status  New      PT SHORT TERM GOAL #3   Title  He will increase his walking by 25%    Time  4    Period  Weeks    Status  New      PT SHORT TERM GOAL #4   Title  He will report improved ability to walk steps with one rail step over step    Time  4    Period  Weeks    Status  New        PT Long Term Goals - 10/31/18 [id number]      PT LONG TERM GOAL #1   Title  He will be independent with all HEP issued    Time  8    Period  Weeks    Status  New      PT LONG TERM GOAL #2   Title  Patient will sit for 2 hours without self report of pain in order to perfrom work tasks    Time  8    Period  Weeks    Status  New      PT LONG TERM GOAL #3   Title  Patient will demonstrate a 32% limitation on FOTO    Time  8    Period  Weeks    Status  New      PT LONG TERM GOAL #4   Title  He will walk with no device for all activities.    Time  8    Period  Weeks    Status  New      PT LONG TERM GOAL #5   Title  He will report pain max 1-2/10 with all  work and home tasks    Time  [place] - 11/11/18 0919    Clinical Impression Statement  Pt notes improved ability to lift Left leg without UE assist. Progressed therex to strengthen adductors and progressed with increased resistance for quad strengthening. He is able to perform SLR however has a quad lag.    PT Next Visit Plan  review HEP and progress , bike or nustep, strengthening    PT Home Exercise Plan  LAQ , side hip abduction , prone hip ext, mini bridge, SAQ, modified thomas, butterfly stretch supine, seated hamsting and calf stretch       Patient will benefit from skilled therapeutic intervention in order to improve the following deficits and impairments:  Pain, Postural dysfunction, Increased muscle spasms, Decreased activity tolerance, Decreased range of motion, Decreased strength, Difficulty walking  Visit Diagnosis: Radiculopathy, lumbar region  Muscle spasm of back  Other abnormalities of gait and mobility  Muscle weakness (generalized)  Difficulty in walking, not elsewhere classified  Acute left-sided low back pain with right-sided sciatica     Problem List Patient Active Problem List   Diagnosis Date Noted  . GERD (gastroesophageal reflux disease) 03/22/2018  . Hypertension 03/22/2018  .  Lumbar herniated disc 03/22/2014  . BPH (benign prostatic hyperplasia) 03/03/2012  . Family history of ischemic heart disease (IHD) 08/10/2011  . Colon adenoma 06/11/2011  . Vitamin D deficiency 06/11/2011  . ED (erectile dysfunction) 06/02/2011  . Hyperlipidemia 03/22/2002    Dorene Ar, PTA 11/11/2018, 9:38 AM  [facility] [address] [place], [place], [address] Phone: [phone]   Fax:  [phone]  Name: Mason Vargas MRN: [id number] Date of Birth: 19-May-1955

## 2018-11-14 ENCOUNTER — Ambulatory Visit: Payer: Commercial Managed Care - PPO | Attending: Neurosurgery

## 2018-11-14 ENCOUNTER — Other Ambulatory Visit: Payer: Self-pay

## 2018-11-14 DIAGNOSIS — M5416 Radiculopathy, lumbar region: Secondary | ICD-10-CM

## 2018-11-14 DIAGNOSIS — M62830 Muscle spasm of back: Secondary | ICD-10-CM | POA: Insufficient documentation

## 2018-11-14 DIAGNOSIS — R262 Difficulty in walking, not elsewhere classified: Secondary | ICD-10-CM | POA: Insufficient documentation

## 2018-11-14 DIAGNOSIS — R2689 Other abnormalities of gait and mobility: Secondary | ICD-10-CM | POA: Insufficient documentation

## 2018-11-14 DIAGNOSIS — M6281 Muscle weakness (generalized): Secondary | ICD-10-CM | POA: Insufficient documentation

## 2018-11-14 NOTE — Therapy (Signed)
[place], [place], [address] Phone: [phone]   Fax:  [phone]  Physical Therapy Treatment  Patient Details  Name: Mason Vargas MRN: [id number] Date of Birth: 07/04/1955 Referring Provider (PT): Newman Pies, MD   Encounter Date: 11/14/2018  PT End of Session - 11/14/18 0944    Visit Number  4    Number of Visits  16    Date for PT Re-Evaluation  12/22/18    Authorization Type  BCBS    PT Start Time  1000    PT Stop Time  1045    PT Time Calculation (min)  45 min    Activity Tolerance  Patient tolerated treatment well    Behavior During Therapy  [facility] for tasks assessed/performed       Past Medical History:  Diagnosis Date  . GERD (gastroesophageal reflux disease)   . Hyperlipidemia   . Hypertension   . Insomnia   . Lumbar herniated disc   . Sciatica 04/08/2018   Left leg  . Snores     Past Surgical History:  Procedure Laterality Date  . CLOSED REDUCTION NASAL FRACTURE N/A 05/26/2018   Procedure: CLOSED REDUCTION NASAL FRACTURE;  Surgeon: Rozetta Nunnery, MD;  Location: [place];  Service: ENT;  Laterality: N/A;  . COLONOSCOPY W/ POLYPECTOMY      There were no vitals filed for this visit.  Subjective Assessment - 11/14/18 1042    Subjective  Need to push myself or I don't feel like I'm doing anything beneficial    Currently in Pain?  No/denies                       [facility] Adult PT Treatment/Exercise - 11/14/18 0001      Lumbar Exercises: Stretches   Active Hamstring Stretch  Right;Left;2 reps;30 seconds    Hip Flexor Stretch  1 rep;30 seconds    Hip Flexor Stretch Limitations  modified thomas  in sitting    Other Lumbar Stretch Exercise  butterfly stretch x 30 sec  bilat       Lumbar Exercises: Aerobic   Recumbent Bike  L2 x  12 minutes      Lumbar Exercises: Seated   Long Arc Quad on Chair  Left;2 sets;10 reps    LAQ on Chair Weights  (lbs)  3    LAQ on Chair Limitations  left    Sit to Stand  10 reps    Sit to Stand Limitations  2 sets  no UE assist      Lumbar Exercises: Supine   Bridge  20 reps      Lumbar Exercises: Sidelying   Hip Abduction  20 reps    Hip Abduction Limitations  also  hip abduction od with LT knee flex/ext x 3   5 sets    Other Sidelying Lumbar Exercises  hip adduction x 15    Other Sidelying Lumbar Exercises  hip SLRx   15      Knee/Hip Exercises: Supine   Quad Sets  20 reps    Quad Sets Limitations  with SLR     Short Arc Quad Sets  20 reps    Straight Leg Raises  10 reps    Straight Leg Raises Limitations  with initial quad set       Knee/Hip Exercises: Prone   Hip Extension  15 reps    Other Prone Exercises  Prone hip IR/ER  x12 LT                PT Short Term Goals - 11/14/18 1042      PT SHORT TERM GOAL #1   Title  He will report 40% or more improvement with back pain    Status  Partially Met      PT SHORT TERM GOAL #2   Title  Lt quad will improve to 4/5 strength.,    Status  On-going      PT SHORT TERM GOAL #3   Title  He will increase his walking by 25%    Status  On-going      PT SHORT TERM GOAL #4   Title  He will report improved ability to walk steps with one rail step over step    Status  On-going        PT Long Term Goals - 10/31/18 9629      PT LONG TERM GOAL #1   Title  He will be independent with all HEP issued    Time  8    Period  Weeks    Status  New      PT LONG TERM GOAL #2   Title  Patient will sit for 2 hours without self report of pain in order to perfrom work tasks    Time  8    Period  Weeks    Status  New      PT LONG TERM GOAL #3   Title  Patient will demonstrate a 32% limitation on FOTO    Time  8    Period  Weeks    Status  New      PT LONG TERM GOAL #4   Title  He will walk with no device for all activities.    Time  8    Period  Weeks    Status  New      PT LONG TERM GOAL #5   Title  He will report pain max  1-2/10 with all work and home tasks    Time  8    Period  Weeks    Status  New            Plan - 11/14/18 0944    Clinical Impression Statement  Continue weakness  Lt quad.   Cautioned Pt to not push through any pain.  Does all exercise well    PT Treatment/Interventions  Electrical Stimulation;Moist Heat;Cryotherapy;Stair training;Gait training;Therapeutic exercise;Therapeutic activities;Neuromuscular re-education;Patient/family education;Manual techniques;Passive range of motion;Dry needling;Taping    PT Next Visit Plan  HEP and progress , bike or nustep, strengthening    PT Home Exercise Plan  LAQ , side hip abduction , prone hip ext, mini bridge, SAQ, modified thomas, butterfly stretch supine, seated hamsting and calf stretch    Consulted and Agree with Plan of Care  Patient       Patient will benefit from skilled therapeutic intervention in order to improve the following deficits and impairments:  Pain, Postural dysfunction, Increased muscle spasms, Decreased activity tolerance, Decreased range of motion, Decreased strength, Difficulty walking  Visit Diagnosis: Radiculopathy, lumbar region  Muscle spasm of back  Other abnormalities of gait and mobility  Muscle weakness (generalized)  Difficulty in walking, not elsewhere classified     Problem List Patient Active Problem List   Diagnosis Date Noted  . GERD (gastroesophageal reflux disease) 03/22/2018  . Hypertension 03/22/2018  . Lumbar herniated disc 03/22/2014  . BPH (benign prostatic hyperplasia) 03/03/2012  .  Family history of ischemic heart disease (IHD) 08/10/2011  . Colon adenoma 06/11/2011  . Vitamin D deficiency 06/11/2011  . ED (erectile dysfunction) 06/02/2011  . Hyperlipidemia 03/22/2002    Darrel Hoover  PT  11/14/2018, 10:44 AM  [facility] [address] [place], [place], [address] Phone: [phone]   Fax:  [phone]  Name: Mason Vargas MRN: [id number] Date of Birth: [birth year]

## 2018-11-17 ENCOUNTER — Other Ambulatory Visit: Payer: Self-pay

## 2018-11-17 ENCOUNTER — Ambulatory Visit: Payer: Commercial Managed Care - PPO

## 2018-11-17 DIAGNOSIS — M62830 Muscle spasm of back: Secondary | ICD-10-CM

## 2018-11-17 DIAGNOSIS — R262 Difficulty in walking, not elsewhere classified: Secondary | ICD-10-CM | POA: Diagnosis present

## 2018-11-17 DIAGNOSIS — M5416 Radiculopathy, lumbar region: Secondary | ICD-10-CM

## 2018-11-17 DIAGNOSIS — R2689 Other abnormalities of gait and mobility: Secondary | ICD-10-CM | POA: Diagnosis present

## 2018-11-17 DIAGNOSIS — M6281 Muscle weakness (generalized): Secondary | ICD-10-CM

## 2018-11-17 NOTE — Therapy (Signed)
[place], [place], [address] Phone: [phone]   Fax:  [phone]  Physical Therapy Treatment  Patient Details  Name: Mason Vargas MRN: [id number] Date of Birth: 1955/01/14 Referring Provider (PT): Newman Pies, MD   Encounter Date: 11/17/2018  PT End of Session - 11/17/18 0821    Visit Number  5    Number of Visits  16    Date for PT Re-Evaluation  12/22/18    Authorization Type  BCBS    PT Start Time  0821    PT Stop Time  0905    PT Time Calculation (min)  44 min    Activity Tolerance  Patient tolerated treatment well    Behavior During Therapy  [facility] for tasks assessed/performed       Past Medical History:  Diagnosis Date  . GERD (gastroesophageal reflux disease)   . Hyperlipidemia   . Hypertension   . Insomnia   . Lumbar herniated disc   . Sciatica 04/08/2018   Left leg  . Snores     Past Surgical History:  Procedure Laterality Date  . CLOSED REDUCTION NASAL FRACTURE N/A 05/26/2018   Procedure: CLOSED REDUCTION NASAL FRACTURE;  Surgeon: Rozetta Nunnery, MD;  Location: [place];  Service: ENT;  Laterality: N/A;  . COLONOSCOPY W/ POLYPECTOMY      There were no vitals filed for this visit.  Subjective Assessment - 11/17/18 0825    Subjective  No pain  still weak in quads    Currently in Pain?  No/denies         [facility] PT Assessment - 11/17/18 0001      Strength   Left Hip ABduction  4/5                   [facility] Adult PT Treatment/Exercise - 11/17/18 0001      Lumbar Exercises: Aerobic   Recumbent Bike  L3 6 min      Lumbar Exercises: Seated   Long Arc Quad on Chair  Left;3 sets;10 reps    LAQ on Chair Weights (lbs)  5    LAQ on Chair Limitations  left      Lumbar Exercises: Supine   Bridge Limitations  30 reps.       Lumbar Exercises: Sidelying   Clam  Left;20 reps    Clam Limitations  3# and x20 3# reverse clams weight at ankle    Hip Abduction  20 reps    Hip Abduction Weights (lbs)  3      Knee/Hip Exercises: Supine   Short Arc [employer]  20 reps    Short Arc Quad Sets Limitations  with electrical stimulation [place].       Knee/Hip Exercises: Prone   Straight Leg Raises  Left;20 reps    Straight Leg Raises Limitations  3#      Modalities   Modalities  Teacher, English as a foreign language Location  LT quad    Electrical Stimulation Action  russian    Electrical Stimulation Parameters  to tolerance    Electrical Stimulation Goals  Strength               PT Short Term Goals - 11/14/18 1042      PT SHORT TERM GOAL #1   Title  He will report 40% or more improvement with back pain    Status  Partially Met  PT SHORT TERM GOAL #2   Title  Lt quad will improve to 4/5 strength.,    Status  On-going      PT SHORT TERM GOAL #3   Title  He will increase his walking by 25%    Status  On-going      PT SHORT TERM GOAL #4   Title  He will report improved ability to walk steps with one rail step over step    Status  On-going        PT Long Term Goals - 10/31/18 4656      PT LONG TERM GOAL #1   Title  He will be independent with all HEP issued    Time  8    Period  Weeks    Status  New      PT LONG TERM GOAL #2   Title  Patient will sit for 2 hours without self report of pain in order to perfrom work tasks    Time  8    Period  Weeks    Status  New      PT LONG TERM GOAL #3   Title  Patient will demonstrate a 32% limitation on FOTO    Time  8    Period  Weeks    Status  New      PT LONG TERM GOAL #4   Title  He will walk with no device for all activities.    Time  8    Period  Weeks    Status  New      PT LONG TERM GOAL #5   Title  He will report pain max 1-2/10 with all work and home tasks    Time  8    Period  Weeks    Status  New            Plan - 11/17/18 8127    Clinical Impression Statement  Added to HEP with weight.    May continue with ES.  No pain . Conitnue with quad lag.    PT Treatment/Interventions  Hydrographic surveyor;Therapeutic exercise;Therapeutic activities;Neuromuscular re-education;Patient/family education;Manual techniques;Passive range of motion;Dry needling;Taping    PT Next Visit Plan  HEP and progress , bike or nustep, strengthening    PT Home Exercise Plan  LAQ , side hip abduction , prone hip ext, mini bridge, SAQ, modified thomas, butterfly stretch supine, seated hamsting and calf stretch,  wieghted sidely ab and adduction, and clams /reverse also, LAQ, PKF, prone SLR,    Consulted and Agree with Plan of Care  Patient       Patient will benefit from skilled therapeutic intervention in order to improve the following deficits and impairments:  Pain, Postural dysfunction, Increased muscle spasms, Decreased activity tolerance, Decreased range of motion, Decreased strength, Difficulty walking  Visit Diagnosis: Muscle spasm of back  Other abnormalities of gait and mobility  Muscle weakness (generalized)  Radiculopathy, lumbar region     Problem List Patient Active Problem List   Diagnosis Date Noted  . GERD (gastroesophageal reflux disease) 03/22/2018  . Hypertension 03/22/2018  . Lumbar herniated disc 03/22/2014  . BPH (benign prostatic hyperplasia) 03/03/2012  . Family history of ischemic heart disease (IHD) 08/10/2011  . Colon adenoma 06/11/2011  . Vitamin D deficiency 06/11/2011  . ED (erectile dysfunction) 06/02/2011  . Hyperlipidemia 03/22/2002    Darrel Hoover  PT 11/17/2018, 9:06 AM  [place],  [place], [address] Phone: [phone]   Fax:  [phone]  Name: Mason Vargas MRN: [id number] Date of Birth: July 19, 1955

## 2018-11-21 ENCOUNTER — Other Ambulatory Visit: Payer: Self-pay

## 2018-11-21 ENCOUNTER — Ambulatory Visit: Payer: Commercial Managed Care - PPO

## 2018-11-21 DIAGNOSIS — M6281 Muscle weakness (generalized): Secondary | ICD-10-CM

## 2018-11-21 DIAGNOSIS — M5416 Radiculopathy, lumbar region: Secondary | ICD-10-CM

## 2018-11-21 DIAGNOSIS — R2689 Other abnormalities of gait and mobility: Secondary | ICD-10-CM

## 2018-11-21 DIAGNOSIS — R262 Difficulty in walking, not elsewhere classified: Secondary | ICD-10-CM

## 2018-11-21 DIAGNOSIS — M62830 Muscle spasm of back: Secondary | ICD-10-CM

## 2018-11-21 NOTE — Therapy (Signed)
[place], [place], [address] Phone: [phone]   Fax:  [phone]  Physical Therapy Treatment  Patient Details  Name: Mason Vargas MRN: [id number] Date of Birth: Jun 27, 1955 Referring Provider (PT): Newman Pies, MD   Encounter Date: 11/21/2018  PT End of Session - 11/21/18 0829    Visit Number  6    Number of Visits  16    Date for PT Re-Evaluation  12/22/18    Authorization Type  BCBS    PT Start Time  0745    PT Stop Time  0828    PT Time Calculation (min)  43 min    Activity Tolerance  Patient tolerated treatment well    Behavior During Therapy  [facility] for tasks assessed/performed       Past Medical History:  Diagnosis Date  . GERD (gastroesophageal reflux disease)   . Hyperlipidemia   . Hypertension   . Insomnia   . Lumbar herniated disc   . Sciatica 04/08/2018   Left leg  . Snores     Past Surgical History:  Procedure Laterality Date  . CLOSED REDUCTION NASAL FRACTURE N/A 05/26/2018   Procedure: CLOSED REDUCTION NASAL FRACTURE;  Surgeon: Rozetta Nunnery, MD;  Location: [place];  Service: ENT;  Laterality: N/A;  . COLONOSCOPY W/ POLYPECTOMY      There were no vitals filed for this visit.  Subjective Assessment - 11/21/18 0746    Subjective  No complaints/.    Currently in Pain?  No/denies                       [facility] Adult PT Treatment/Exercise - 11/21/18 0001      Lumbar Exercises: Aerobic   Recumbent Bike  L3 6 min      Lumbar Exercises: Standing   Heel Raises  20 reps    Heel Raises Limitations  then 15 LT with light assist from RT.     Functional Squats  20 reps    Functional Squats Limitations  hold to free motion    Other Standing Lumbar Exercises  step ups lateral 4 inch step      Lumbar Exercises: Seated   Long Arc Quad on Chair  Left;3 sets;10 reps    LAQ on Chair Weights (lbs)  5    LAQ on Chair Limitations  left      Lumbar Exercises: Sidelying   Clam  Left;20 reps    Clam Limitations  4#    Hip Abduction  Left;10 reps    Hip Abduction Weights (lbs)  4    Hip Abduction Limitations  3 sets      Knee/Hip Exercises: Supine   Straight Leg Raises  Left;3 sets;10 reps               PT Short Term Goals - 11/14/18 1042      PT SHORT TERM GOAL #1   Title  He will report 40% or more improvement with back pain    Status  Partially Met      PT SHORT TERM GOAL #2   Title  Lt quad will improve to 4/5 strength.,    Status  On-going      PT SHORT TERM GOAL #3   Title  He will increase his walking by 25%    Status  On-going      PT SHORT TERM GOAL #4   Title  He will report  improved ability to walk steps with one rail step over step    Status  On-going        PT Long Term Goals - 10/31/18 8185      PT LONG TERM GOAL #1   Title  He will be independent with all HEP issued    Time  8    Period  Weeks    Status  New      PT LONG TERM GOAL #2   Title  Patient will sit for 2 hours without self report of pain in order to perfrom work tasks    Time  8    Period  Weeks    Status  New      PT LONG TERM GOAL #3   Title  Patient will demonstrate a 32% limitation on FOTO    Time  8    Period  Weeks    Status  New      PT LONG TERM GOAL #4   Title  He will walk with no device for all activities.    Time  8    Period  Weeks    Status  New      PT LONG TERM GOAL #5   Title  He will report pain max 1-2/10 with all work and home tasks    Time  8    Period  Weeks    Status  New            Plan - 11/21/18 0757    Clinical Impression Statement  Tolerated added weight to exercises stopped due to mild knee soreness. Cautioned him to stop at home if knee sore.   He feels he is able to stablize better with better quad strength.    PT Treatment/Interventions  Hydrographic surveyor;Therapeutic exercise;Therapeutic activities;Neuromuscular  re-education;Patient/family education;Manual techniques;Passive range of motion;Dry needling;Taping    PT Next Visit Plan  HEP and progress , bike or nustep, strengthening    PT Home Exercise Plan  LAQ , side hip abduction , prone hip ext, mini bridge, SAQ, modified thomas, butterfly stretch supine, seated hamsting and calf stretch,  wieghted sidely ab and adduction, and clams /reverse also, LAQ, PKF, prone SLR,    Consulted and Agree with Plan of Care  Patient       Patient will benefit from skilled therapeutic intervention in order to improve the following deficits and impairments:  Pain, Postural dysfunction, Increased muscle spasms, Decreased activity tolerance, Decreased range of motion, Decreased strength, Difficulty walking  Visit Diagnosis: Muscle spasm of back  Muscle weakness (generalized)  Radiculopathy, lumbar region  Other abnormalities of gait and mobility  Difficulty in walking, not elsewhere classified     Problem List Patient Active Problem List   Diagnosis Date Noted  . GERD (gastroesophageal reflux disease) 03/22/2018  . Hypertension 03/22/2018  . Lumbar herniated disc 03/22/2014  . BPH (benign prostatic hyperplasia) 03/03/2012  . Family history of ischemic heart disease (IHD) 08/10/2011  . Colon adenoma 06/11/2011  . Vitamin D deficiency 06/11/2011  . ED (erectile dysfunction) 06/02/2011  . Hyperlipidemia 03/22/2002    Darrel Hoover  PT 11/21/2018, 8:31 AM  [facility] [address] [place], [place], [address] Phone: [phone]   Fax:  [phone]  Name: Mason Vargas MRN: [id number] Date of Birth: 10-08-1955

## 2018-11-24 ENCOUNTER — Other Ambulatory Visit: Payer: Self-pay

## 2018-11-24 ENCOUNTER — Ambulatory Visit: Payer: Commercial Managed Care - PPO

## 2018-11-24 DIAGNOSIS — M6281 Muscle weakness (generalized): Secondary | ICD-10-CM

## 2018-11-24 DIAGNOSIS — M5416 Radiculopathy, lumbar region: Secondary | ICD-10-CM | POA: Diagnosis not present

## 2018-11-24 DIAGNOSIS — M62830 Muscle spasm of back: Secondary | ICD-10-CM

## 2018-11-24 NOTE — Therapy (Signed)
[place], [place], [address] Phone: [phone]   Fax:  [phone]  Physical Therapy Treatment  Patient Details  Name: Mason Vargas MRN: [id number] Date of Birth: Mar 23, 1955 Referring Provider (PT): Newman Pies, MD   Encounter Date: 11/24/2018  PT End of Session - 11/24/18 0753    Visit Number  7    Number of Visits  16    Date for PT Re-Evaluation  12/22/18    Authorization Type  BCBS    PT Start Time  0745    PT Stop Time  0840    PT Time Calculation (min)  55 min    Activity Tolerance  Patient tolerated treatment well    Behavior During Therapy  [facility] for tasks assessed/performed       Past Medical History:  Diagnosis Date  . GERD (gastroesophageal reflux disease)   . Hyperlipidemia   . Hypertension   . Insomnia   . Lumbar herniated disc   . Sciatica 04/08/2018   Left leg  . Snores     Past Surgical History:  Procedure Laterality Date  . CLOSED REDUCTION NASAL FRACTURE N/A 05/26/2018   Procedure: CLOSED REDUCTION NASAL FRACTURE;  Surgeon: Rozetta Nunnery, MD;  Location: [place];  Service: ENT;  Laterality: N/A;  . COLONOSCOPY W/ POLYPECTOMY      There were no vitals filed for this visit.  Subjective Assessment - 11/24/18 0753    Subjective  No new issues. Sore for exercise so did one exercise yesterday    Currently in Pain?  No/denies                       [facility] Adult PT Treatment/Exercise - 11/24/18 0001      Lumbar Exercises: Aerobic   Recumbent Bike  L4 6 min      Lumbar Exercises: Standing   Heel Raises  20 reps    Heel Raises Limitations  then 15 LT with light assist from RT.     Functional Squats  20 reps    Functional Squats Limitations  hold to  leg press    Other Standing Lumbar Exercises  step ups lateral 4 inch step x 20      Lumbar Exercises: Seated   Long Arc Quad on Chair  Left;3 sets;10 reps   hold 5 sec   LAQ on  Chair Weights (lbs)  5    LAQ on Chair Limitations  left      Lumbar Exercises: Sidelying   Clam  Left;20 reps    Clam Limitations  5#    Hip Abduction  Left;10 reps    Hip Abduction Weights (lbs)  5    Hip Abduction Limitations  3 sets      Knee/Hip Exercises: Supine   Short Arc Quad Sets  20 reps    Short Arc Quad Sets Limitations  with ES    Straight Leg Raises  Left;3 sets;10 reps    Straight Leg Raises Limitations  with initial quad set       Knee/Hip Exercises: Prone   Hamstring Curl Limitations  5# LT   30 reps    Straight Leg Raises  Left    Straight Leg Raises Limitations  5#      Electrical Stimulation   Electrical Stimulation Location  LT quad    Electrical Stimulation Action  russian    Electrical Stimulation Parameters  to tolerance  Electrical Stimulation Goals  Strength               PT Short Term Goals - 11/14/18 1042      PT SHORT TERM GOAL #1   Title  He will report 40% or more improvement with back pain    Status  Partially Met      PT SHORT TERM GOAL #2   Title  Lt quad will improve to 4/5 strength.,    Status  On-going      PT SHORT TERM GOAL #3   Title  He will increase his walking by 25%    Status  On-going      PT SHORT TERM GOAL #4   Title  He will report improved ability to walk steps with one rail step over step    Status  On-going        PT Long Term Goals - 10/31/18 5974      PT LONG TERM GOAL #1   Title  He will be independent with all HEP issued    Time  8    Period  Weeks    Status  New      PT LONG TERM GOAL #2   Title  Patient will sit for 2 hours without self report of pain in order to perfrom work tasks    Time  8    Period  Weeks    Status  New      PT LONG TERM GOAL #3   Title  Patient will demonstrate a 32% limitation on FOTO    Time  8    Period  Weeks    Status  New      PT LONG TERM GOAL #4   Title  He will walk with no device for all activities.    Time  8    Period  Weeks    Status  New       PT LONG TERM GOAL #5   Title  He will report pain max 1-2/10 with all work and home tasks    Time  8    Period  Weeks    Status  New            Plan - 11/24/18 0910    Clinical Impression Statement  He is about the same as last session but working hard and feels fatigue at end of session    PT Treatment/Interventions  Electrical Stimulation;Moist Heat;Cryotherapy;Stair training;Gait training;Therapeutic exercise;Therapeutic activities;Neuromuscular re-education;Patient/family education;Manual techniques;Passive range of motion;Dry needling;Taping    PT Next Visit Plan  HEP and progress , bike or nustep, strengthening  electric stim quad, goals    FOTO    PT Home Exercise Plan  LAQ , side hip abduction , prone hip ext, mini bridge, SAQ, modified thomas, butterfly stretch supine, seated hamsting and calf stretch,  wieghted sidely ab and adduction, and clams /reverse also, LAQ, PKF, prone SLR,    Consulted and Agree with Plan of Care  Patient       Patient will benefit from skilled therapeutic intervention in order to improve the following deficits and impairments:  Pain, Postural dysfunction, Increased muscle spasms, Decreased activity tolerance, Decreased range of motion, Decreased strength, Difficulty walking  Visit Diagnosis: Muscle weakness (generalized)  Muscle spasm of back     Problem List Patient Active Problem List   Diagnosis Date Noted  . GERD (gastroesophageal reflux disease) 03/22/2018  . Hypertension 03/22/2018  . Lumbar herniated disc 03/22/2014  .  BPH (benign prostatic hyperplasia) 03/03/2012  . Family history of ischemic heart disease (IHD) 08/10/2011  . Colon adenoma 06/11/2011  . Vitamin D deficiency 06/11/2011  . ED (erectile dysfunction) 06/02/2011  . Hyperlipidemia 03/22/2002    Darrel Hoover  PT 11/24/2018, 9:12 AM  [facility] [address] [place], [place], [address] Phone: [phone]    Fax:  [phone]  Name: Mason Vargas MRN: [id number] Date of Birth: 04-Nov-1955

## 2018-11-28 ENCOUNTER — Ambulatory Visit: Payer: Commercial Managed Care - PPO

## 2018-11-28 ENCOUNTER — Other Ambulatory Visit: Payer: Self-pay

## 2018-11-28 DIAGNOSIS — M6281 Muscle weakness (generalized): Secondary | ICD-10-CM

## 2018-11-28 DIAGNOSIS — M5416 Radiculopathy, lumbar region: Secondary | ICD-10-CM | POA: Diagnosis not present

## 2018-11-28 NOTE — Therapy (Signed)
[place], [place], [address] Phone: [phone]   Fax:  [phone]  Physical Therapy Treatment  Patient Details  Name: Mason Vargas MRN: [id number] Date of Birth: 1955-11-19 Referring Provider (PT): Newman Pies, MD   Encounter Date: 11/28/2018  PT End of Session - 11/28/18 0740    Visit Number  8    Number of Visits  16    Date for PT Re-Evaluation  12/22/18    Authorization Type  BCBS    PT Start Time  0740    PT Stop Time  0838    PT Time Calculation (min)  58 min    Activity Tolerance  Patient tolerated treatment well    Behavior During Therapy  [facility] for tasks assessed/performed       Past Medical History:  Diagnosis Date  . GERD (gastroesophageal reflux disease)   . Hyperlipidemia   . Hypertension   . Insomnia   . Lumbar herniated disc   . Sciatica 04/08/2018   Left leg  . Snores     Past Surgical History:  Procedure Laterality Date  . CLOSED REDUCTION NASAL FRACTURE N/A 05/26/2018   Procedure: CLOSED REDUCTION NASAL FRACTURE;  Surgeon: Rozetta Nunnery, MD;  Location: [place];  Service: ENT;  Laterality: N/A;  . COLONOSCOPY W/ POLYPECTOMY      There were no vitals filed for this visit.  Subjective Assessment - 11/28/18 0755    Subjective  No new issues . SOre Sat so eaed off that day then reumed Sunday exer. Some knee pain with exercise yester day but not now    Currently in Pain?  No/denies                       [facility] Adult PT Treatment/Exercise - 11/28/18 0001      Lumbar Exercises: Aerobic   Recumbent Bike  L4 12 min      Lumbar Exercises: Standing   Heel Raises  20 reps    Functional Squats Limitations  hold to  leg press 30 reps    Forward Lunge  20 reps;3 seconds    Other Standing Lumbar Exercises  step ups lateral 4 inch step 3 x 10      Lumbar Exercises: Seated   Long Arc Quad on Chair  Left;3 sets;10 reps    LAQ on Chair  Weights (lbs)  6    LAQ on Chair Limitations  left      Lumbar Exercises: Sidelying   Clam Limitations  6# LT 3x10    Hip Abduction  Left;10 reps    Hip Abduction Weights (lbs)  6    Hip Abduction Limitations  3 sets      Knee/Hip Exercises: Supine   Short Arc Quad Sets  20 reps    Short Arc Quad Sets Limitations  with ES 10 sec hold      Knee/Hip Exercises: Prone   Hamstring Curl Limitations  6 # LT   30 reps      Electrical Stimulation   Electrical Stimulation Location  LT quad    Electrical Stimulation Action  russian    Electrical Stimulation Parameters  to tolerance    Museum/gallery curator             PT Education - 11/28/18 0754    Education Details  HEP    Person(s) Educated  Patient    Methods  Explanation;Demonstration;Tactile cues;Verbal  cues;Handout    Comprehension  Returned demonstration;Verbalized understanding       PT Short Term Goals - 11/14/18 1042      PT SHORT TERM GOAL #1   Title  He will report 40% or more improvement with back pain    Status  Partially Met      PT SHORT TERM GOAL #2   Title  Lt quad will improve to 4/5 strength.,    Status  On-going      PT SHORT TERM GOAL #3   Title  He will increase his walking by 25%    Status  On-going      PT SHORT TERM GOAL #4   Title  He will report improved ability to walk steps with one rail step over step    Status  On-going        PT Long Term Goals - 10/31/18 2671      PT LONG TERM GOAL #1   Title  He will be independent with all HEP issued    Time  8    Period  Weeks    Status  New      PT LONG TERM GOAL #2   Title  Patient will sit for 2 hours without self report of pain in order to perfrom work tasks    Time  8    Period  Weeks    Status  New      PT LONG TERM GOAL #3   Title  Patient will demonstrate a 32% limitation on FOTO    Time  8    Period  Weeks    Status  New      PT LONG TERM GOAL #4   Title  He will walk with no device for all activities.     Time  8    Period  Weeks    Status  New      PT LONG TERM GOAL #5   Title  He will report pain max 1-2/10 with all work and home tasks    Time  [place] - 11/28/18 0758    PT Treatment/Interventions  Electrical Stimulation;Moist Heat;Cryotherapy;Stair training;Gait training;Therapeutic exercise;Therapeutic activities;Neuromuscular re-education;Patient/family education;Manual techniques;Passive range of motion;Dry needling;Taping    PT Next Visit Plan  HEP and progress , bike or nustep, strengthening  electric stim quad, goals    FOTO    PT Home Exercise Plan  LAQ , side hip abduction , prone hip ext, mini bridge, SAQ, modified thomas, butterfly stretch supine, seated hamsting and calf stretch,  wieghted sidely ab and adduction, and clams /reverse also, LAQ, PKF, prone SLR,    Consulted and Agree with Plan of Care  Patient       Patient will benefit from skilled therapeutic intervention in order to improve the following deficits and impairments:  Pain, Postural dysfunction, Increased muscle spasms, Decreased activity tolerance, Decreased range of motion, Decreased strength, Difficulty walking  Visit Diagnosis: Muscle weakness (generalized)  Radiculopathy, lumbar region     Problem List Patient Active Problem List   Diagnosis Date Noted  . GERD (gastroesophageal reflux disease) 03/22/2018  . Hypertension 03/22/2018  . Lumbar herniated disc 03/22/2014  . BPH (benign prostatic hyperplasia) 03/03/2012  . Family history of ischemic heart disease (IHD) 08/10/2011  . Colon adenoma 06/11/2011  . Vitamin D deficiency 06/11/2011  . ED (erectile  dysfunction) 06/02/2011  . Hyperlipidemia 03/22/2002    Darrel Hoover PT 11/28/2018, 10:07 AM  [facility] [address] [place], [place], [address] Phone: [phone]   Fax:  [phone]  Name: ALAZAR Vargas MRN: [id number] Date of  Birth: [birth year]

## 2018-12-01 ENCOUNTER — Ambulatory Visit: Payer: Commercial Managed Care - PPO

## 2018-12-01 ENCOUNTER — Other Ambulatory Visit: Payer: Self-pay

## 2018-12-01 DIAGNOSIS — M5416 Radiculopathy, lumbar region: Secondary | ICD-10-CM

## 2018-12-01 DIAGNOSIS — M62830 Muscle spasm of back: Secondary | ICD-10-CM

## 2018-12-01 DIAGNOSIS — R2689 Other abnormalities of gait and mobility: Secondary | ICD-10-CM

## 2018-12-01 DIAGNOSIS — M6281 Muscle weakness (generalized): Secondary | ICD-10-CM

## 2018-12-01 NOTE — Therapy (Addendum)
[place], [place], [address] Phone: [phone]   Fax:  [phone]  Physical Therapy Treatment  Patient Details  Name: Mason Vargas MRN: [id number] Date of Birth: 12/30/1955 Referring Provider (PT): Newman Pies, MD   Encounter Date: 12/01/2018  PT End of Session - 12/01/18 0815    Visit Number  9    Number of Visits  28   Date for PT Re-Evaluation 01/29 /2021   Authorization Type  BCBS    PT Start Time  0740    PT Stop Time  0825    PT Time Calculation (min)  45 min    Activity Tolerance  Patient tolerated treatment well    Behavior During Therapy  [facility] for tasks assessed/performed       Past Medical History:  Diagnosis Date  . GERD (gastroesophageal reflux disease)   . Hyperlipidemia   . Hypertension   . Insomnia   . Lumbar herniated disc   . Sciatica 04/08/2018   Left leg  . Snores     Past Surgical History:  Procedure Laterality Date  . CLOSED REDUCTION NASAL FRACTURE N/A 05/26/2018   Procedure: CLOSED REDUCTION NASAL FRACTURE;  Surgeon: Rozetta Nunnery, MD;  Location: [place];  Service: ENT;  Laterality: N/A;  . COLONOSCOPY W/ POLYPECTOMY      There were no vitals filed for this visit.  Subjective Assessment - 12/01/18 0749    Subjective  Gluteal soreness from lunges on Monday. OK other wise    Currently in Pain?  No/denies                       [facility] Adult PT Treatment/Exercise - 12/01/18 0001      Lumbar Exercises: Aerobic   Elliptical  L3 R 5 5 min    Recumbent Bike  L3 6 min    Other Aerobic Exercise  Stepper  no load x 2.5 min      Lumbar Exercises: Standing   Heel Raises Limitations  30 reps then  15 on LT with light assist from RT    Functional Squats Limitations  30 reps hold to free motion    Other Standing Lumbar Exercises  step ups lateral 4 inch step 3 x 10      Lumbar Exercises: Seated   Long Arc Quad on Chair  Left;3  sets;10 reps    LAQ on Chair Weights (lbs)  7    LAQ on Chair Limitations  left      Lumbar Exercises: Sidelying   Clam Limitations  --    Hip Abduction  --    Hip Abduction Weights (lbs)  --    Hip Abduction Limitations  --      Knee/Hip Exercises: Supine   Short Arc Quad Sets  --    Short Arc Quad Sets Limitations  --               PT Short Term Goals - 11/14/18 1042      PT SHORT TERM GOAL #1   Title  He will report 40% or more improvement with back pain    Status  Partially Met      PT SHORT TERM GOAL #2   Title  Lt quad will improve to 4/5 strength.,    Status  On-going      PT SHORT TERM GOAL #3   Title  He will increase his walking by 25%  Status  On-going      PT SHORT TERM GOAL #4   Title  He will report improved ability to walk steps with one rail step over step    Status  On-going        PT Long Term Goals - 10/31/18 6237      PT LONG TERM GOAL #1   Title  He will be independent with all HEP issued    Time  8    Period  Weeks    Status  New      PT LONG TERM GOAL #2   Title  Patient will sit for 2 hours without self report of pain in order to perfrom work tasks    Time  8    Period  Weeks    Status  New      PT LONG TERM GOAL #3   Title  Patient will demonstrate a 32% limitation on FOTO    Time  8    Period  Weeks    Status  New      PT LONG TERM GOAL #4   Title  He will walk with no device for all activities.    Time  8    Period  Weeks    Status  New      PT LONG TERM GOAL #5   Title  He will report pain max 1-2/10 with all work and home tasks    Time  8    Period  Weeks    Status  New            Plan - 12/01/18 6283    Clinical Impression Statement  Sore and fatigue by end so I decided to minimize glueal exercises and  no ES at end .   No increased soreness but he may need a few more days to let soreness ease off befor ewe push againg. Quads were fatigued soe opted to not do Es today.    PT Treatment/Interventions   Hydrographic surveyor;Therapeutic exercise;Therapeutic activities;Neuromuscular re-education;Patient/family education;Manual techniques;Passive range of motion;Dry needling;Taping    PT Next Visit Plan  HEP and progress , bike or nustep, strengthening  electric stim quad, goals    FOTO    PT Home Exercise Plan  LAQ , side hip abduction , prone hip ext, mini bridge, SAQ, modified thomas, butterfly stretch supine, seated hamsting and calf stretch,  wieghted sidely ab and adduction, and clams /reverse also, LAQ, PKF, prone SLR,    Consulted and Agree with Plan of Care  Patient     Treatment frequency and duration 2x/week for 6 weeks starting 12/29/18  Patient will benefit from skilled therapeutic intervention in order to improve the following deficits and impairments:  Pain, Postural dysfunction, Increased muscle spasms, Decreased activity tolerance, Decreased range of motion, Decreased strength, Difficulty walking  Visit Diagnosis: Muscle weakness (generalized)  Radiculopathy, lumbar region  Muscle spasm of back  Other abnormalities of gait and mobility     Problem List Patient Active Problem List   Diagnosis Date Noted  . GERD (gastroesophageal reflux disease) 03/22/2018  . Hypertension 03/22/2018  . Lumbar herniated disc 03/22/2014  . BPH (benign prostatic hyperplasia) 03/03/2012  . Family history of ischemic heart disease (IHD) 08/10/2011  . Colon adenoma 06/11/2011  . Vitamin D deficiency 06/11/2011  . ED (erectile dysfunction) 06/02/2011  . Hyperlipidemia 03/22/2002    Darrel Hoover  PT 12/01/2018, 8:31 AM  [facility] [phone]  [place], [place], [address] Phone: [phone]   Fax:  [phone]  Name: Mason Vargas MRN: [id number] Date of Birth: 1955/10/26

## 2018-12-19 ENCOUNTER — Telehealth: Payer: Self-pay | Admitting: Physical Therapy

## 2018-12-19 MED FILL — OMEPRAZOLE 20 MG CAPSULE DR: 20 | 90 days supply | Qty: 90 | Fill #2

## 2018-12-19 NOTE — Telephone Encounter (Signed)
Spoke to [name] and he had some insurance issues and is sorting them out and will call to schedule when able.

## 2018-12-29 ENCOUNTER — Ambulatory Visit: Payer: Commercial Managed Care - PPO | Attending: Neurosurgery | Admitting: Physical Therapy

## 2018-12-29 ENCOUNTER — Other Ambulatory Visit: Payer: Self-pay

## 2018-12-29 ENCOUNTER — Encounter: Payer: Self-pay | Admitting: Physical Therapy

## 2018-12-29 DIAGNOSIS — R2689 Other abnormalities of gait and mobility: Secondary | ICD-10-CM | POA: Diagnosis present

## 2018-12-29 DIAGNOSIS — M5441 Lumbago with sciatica, right side: Secondary | ICD-10-CM | POA: Diagnosis present

## 2018-12-29 DIAGNOSIS — M62830 Muscle spasm of back: Secondary | ICD-10-CM | POA: Diagnosis present

## 2018-12-29 DIAGNOSIS — M5416 Radiculopathy, lumbar region: Secondary | ICD-10-CM | POA: Insufficient documentation

## 2018-12-29 DIAGNOSIS — R262 Difficulty in walking, not elsewhere classified: Secondary | ICD-10-CM | POA: Diagnosis present

## 2018-12-29 DIAGNOSIS — M6281 Muscle weakness (generalized): Secondary | ICD-10-CM

## 2018-12-29 NOTE — Addendum Note (Signed)
Addended by: Darrel Hoover on: 12/29/2018 04:05 PM   Modules accepted: Orders

## 2018-12-29 NOTE — Therapy (Addendum)
[place], [place], [address] Phone: [phone]   Fax:  [phone]  Physical Therapy Treatment/Discharge  Patient Details  Name: Mason Vargas MRN: [id number] Date of Birth: 1955/09/27 Referring Provider (PT): Newman Pies, MD   Encounter Date: 12/29/2018  PT End of Session - 12/29/18 1227    Visit Number  10    Number of Visits  16    Date for PT Re-Evaluation  12/22/18    Authorization Type  BCBS    PT Start Time  1147    PT Stop Time  1230    PT Time Calculation (min)  43 min       Past Medical History:  Diagnosis Date  . GERD (gastroesophageal reflux disease)   . Hyperlipidemia   . Hypertension   . Insomnia   . Lumbar herniated disc   . Sciatica 04/08/2018   Left leg  . Snores     Past Surgical History:  Procedure Laterality Date  . CLOSED REDUCTION NASAL FRACTURE N/A 05/26/2018   Procedure: CLOSED REDUCTION NASAL FRACTURE;  Surgeon: Rozetta Nunnery, MD;  Location: [place];  Service: ENT;  Laterality: N/A;  . COLONOSCOPY W/ POLYPECTOMY      There were no vitals filed for this visit.  Subjective Assessment - 12/29/18 1151    Subjective  Tried to get insurance on the phone to make sure I do not need a referral         [facility] PT Assessment - 12/29/18 0001      Strength   Left Hip Flexion  4-/5    Left Hip ABduction  4/5    Left Knee Extension  4-/5   in available ROM, quad lag                   [facility] Adult PT Treatment/Exercise - 12/29/18 0001      Lumbar Exercises: Stretches   Active Hamstring Stretch  3 reps;30 seconds      Lumbar Exercises: Aerobic   Elliptical  L1 R 1 x 5 minutes UE?LE    Recumbent Bike  L x 5 minutes       Lumbar Exercises: Standing   Heel Raises Limitations  30 reps then  15 on LT with light assist from RT    Functional Squats Limitations  30 reps hold to sink     Forward Lunge  20 reps;3 seconds    Other  Standing Lumbar Exercises  step ups lateral 4 inch step 3 x 10    Other Standing Lumbar Exercises  cues to decrease compensation       Lumbar Exercises: Seated   Long Arc Quad on Chair  Left;3 sets;10 reps    LAQ on Chair Weights (lbs)  5    LAQ on Chair Limitations  left               PT Short Term Goals - 12/29/18 1155      PT SHORT TERM GOAL #1   Title  He will report 40% or more improvement with back pain    Baseline  only with weird motions    Time  4    Period  Weeks    Status  Achieved      PT SHORT TERM GOAL #2   Title  Lt quad will improve to 4/5 strength.,    Baseline  improved to 4-/5 in avaiable ROM, has quad lag  Time  4    Period  Weeks    Status  On-going      PT SHORT TERM GOAL #3   Title  He will increase his walking by 25%    Baseline  shopping , walking in neighborhood about a mile    Time  4    Period  Weeks    Status  Achieved      PT SHORT TERM GOAL #4   Title  He will report improved ability to walk steps with one rail step over step    Baseline  improved, able to tolerate climbing stairs with UE assist now    Time  4    Period  Weeks    Status  Achieved        PT Long Term Goals - 12/29/18 1200      PT LONG TERM GOAL #1   Title  He will be independent with all HEP issued    Time  8    Period  Weeks    Status  On-going      PT LONG TERM GOAL #2   Title  Patient will sit for 2 hours without self report of pain in order to perfrom work tasks    Baseline  is able to sit for work 2 hours    Time  8    Period  Weeks    Status  Achieved      PT LONG TERM GOAL #3   Title  Patient will demonstrate a 32% limitation on FOTO (predicted42%)    Baseline  37% limited    Time  8    Period  Weeks    Status  On-going      PT LONG TERM GOAL #4   Title  He will walk with no device for all activities.    Baseline  is walking without AD    Time  8    Period  Weeks    Status  Achieved      PT LONG TERM GOAL #5   Title  He will report  pain max 1-2/10 with all work and home tasks    Baseline  intermittent anterior knee pain up to 3-4/10 with prolonged activity. No longer having back pain    Time  8    Period  Weeks    Status  Achieved            Plan - 12/29/18 1253    Clinical Impression Statement  Mr Venard arrives after several week absence from PT. He reports decreased overall LBP and is ambulating without AD. His LLE strength has improved slightly. He notes some intermittent Left anterior knee pain with prolonged activity on feet. He can now climb stairs using LLE if he uses UE assist which is improved from evaluation. He is walking more now up to one mile for dog walk. STG# 1,3,4 met. LTG#2,4,5 met. Progressing toward remaining goals.    PT Next Visit Plan  HEP and progress , bike or nustep, strengthening  electric stim quad, goals    PT Home Exercise Plan  LAQ , side hip abduction , prone hip ext, mini bridge, SAQ, modified thomas, butterfly stretch supine, seated hamsting and calf stretch,  wieghted sidely ab and adduction, and clams /reverse also, LAQ, PKF, prone SLR,       Patient will benefit from skilled therapeutic intervention in order to improve the following deficits and impairments:  Pain, Postural dysfunction, Increased muscle spasms, Decreased  activity tolerance, Decreased range of motion, Decreased strength, Difficulty walking  Visit Diagnosis: Muscle weakness (generalized)  Radiculopathy, lumbar region  Muscle spasm of back  Other abnormalities of gait and mobility  Difficulty in walking, not elsewhere classified  Acute left-sided low back pain with right-sided sciatica     Problem List Patient Active Problem List   Diagnosis Date Noted  . GERD (gastroesophageal reflux disease) 03/22/2018  . Hypertension 03/22/2018  . Lumbar herniated disc 03/22/2014  . BPH (benign prostatic hyperplasia) 03/03/2012  . Family history of ischemic heart disease (IHD) 08/10/2011  . Colon adenoma  06/11/2011  . Vitamin D deficiency 06/11/2011  . ED (erectile dysfunction) 06/02/2011  . Hyperlipidemia 03/22/2002    Dorene Ar, PTA 12/29/2018, 1:04 PM  [facility] [address] [place], [place], [address] Phone: [phone]   Fax:  [phone]  Name: Mason Vargas MRN: [id number] Date of Birth: [birth year]  PHYSICAL THERAPY DISCHARGE SUMMARY  Visits from Start of Care: 10  Current functional level related to goals / functional outcomes: See above   Remaining deficits: Unknown   Education / Equipment: HEP  Plan: Patient agrees to discharge.  Patient goals were partially met. Patient is being discharged due to financial reasons.  ?????    Pearson Forster PT  02/21/19

## 2019-01-10 ENCOUNTER — Ambulatory Visit: Payer: Commercial Managed Care - PPO

## 2019-01-16 MED FILL — ATORVASTATIN 20 MG TABLET: 20 | 90 days supply | Qty: 90 | Fill #2

## 2019-01-16 MED FILL — LOSARTAN-HCTZ 50-12.5 MG TA: 50-12.5 | 90 days supply | Qty: 45 | Fill #2

## 2019-02-22 ENCOUNTER — Ambulatory Visit (INDEPENDENT_AMBULATORY_CARE_PROVIDER_SITE_OTHER): Payer: Commercial Managed Care - PPO

## 2019-02-22 ENCOUNTER — Encounter: Payer: Self-pay | Admitting: Family Medicine

## 2019-02-22 ENCOUNTER — Ambulatory Visit: Payer: Commercial Managed Care - PPO | Admitting: Family Medicine

## 2019-02-22 ENCOUNTER — Other Ambulatory Visit: Payer: Self-pay

## 2019-02-22 VITALS — BP 130/80 | HR 89 | Temp 98.0°F | Ht 72.0 in | Wt 211.4 lb

## 2019-02-22 DIAGNOSIS — K219 Gastro-esophageal reflux disease without esophagitis: Secondary | ICD-10-CM | POA: Diagnosis not present

## 2019-02-22 DIAGNOSIS — E785 Hyperlipidemia, unspecified: Secondary | ICD-10-CM

## 2019-02-22 DIAGNOSIS — I10 Essential (primary) hypertension: Secondary | ICD-10-CM | POA: Diagnosis not present

## 2019-02-22 DIAGNOSIS — R9389 Abnormal findings on diagnostic imaging of other specified body structures: Secondary | ICD-10-CM | POA: Diagnosis not present

## 2019-02-22 DIAGNOSIS — M5481 Occipital neuralgia: Secondary | ICD-10-CM

## 2019-02-22 NOTE — Progress Notes (Signed)
   Mason Vargas is a 64 y.o. male who presents today for an office visit.  Assessment/Plan:  New/Acute Problems: Occipital neuralgia No red flags.  Normal neurologic exam today.  Given that symptoms are improving do not think needed for any imaging at this point.  Discussed warning signs and reasons to return to care.  He will continue using over-the-counter anti-inflammatories as needed.  Abnormal chest x-ray Repeat chest x-ray today.  Chronic Problems Addressed Today: Hyperlipidemia Continue Lipitor 10 mg daily.  Check lipid panel next blood draw.  Hypertension At goal.  Continue losartan-HCTZ 50-12.5 once daily.  GERD (gastroesophageal reflux disease) Stable.  Continue Prilosec 20 mg daily.     Subjective:  HPI:  Patient has been having left-sided head Vargas intermittently for the past couple of months.  Vargas started after he slipped and fell on the grass a couple of months ago after being pulled down by his dog.  Vargas predominantly located on the top left part of his scalp.  Vargas lasts about 5 seconds and then eases off.  Describes a sharp stabbing Vargas.  No neck Vargas.  He has residual left lower extremity weakness due to a lumbar herniated disc.  Has no new weakness or numbness.  No vision changes.  No nausea or vomiting.  Symptoms seem to be improving.  Patient also reports being diagnosed with "granulomas" in his lung 5 to 10 years ago.  Occasionally has crackling when taking deep breaths.  No reported chest Vargas or shortness of breath.       Objective:  Physical Exam: BP 130/80   Pulse 89   Temp 98 F (36.7 C)   Ht 6' (1.829 m)   Wt 211 lb 6.1 oz (95.9 kg)   SpO2 97%   BMI 28.67 kg/m   Gen: No acute distress, resting comfortably CV: Regular rate and rhythm with no murmurs appreciated Pulm: Normal work of breathing, clear to auscultation bilaterally with no crackles, wheezes, or rhonchi Neuro: Cranial nerves II through XII intact.  Strength 5 out of 5 in upper and  lower extremities with slightly decreased strength in left lower extremity.  Reflexes 2+ and symmetric bilaterally.  Tinel's sign negative at left occipital base. Psych: Normal affect and thought content      Mason Vargas M. Mason Pain, MD 02/22/2019 10:51 AM

## 2019-02-22 NOTE — Assessment & Plan Note (Signed)
Continue Lipitor 10 mg daily.  Check lipid panel next blood draw. 

## 2019-02-22 NOTE — Assessment & Plan Note (Signed)
At goal.  Continue losartan-HCTZ 50-12.5 once daily.  

## 2019-02-22 NOTE — Patient Instructions (Addendum)
It was very nice to see you today!  I think you are having occipital neuralgia.  This should improve over the next several weeks to months.  We will check a chest x-ray today.  Please come back in a few months for your annual physical with blood work.  Come back to see me sooner if needed.  Take care, Dr [name]  Please try these tips to maintain a healthy lifestyle:   Eat at least 3 REAL meals and 1-2 snacks per day.  Aim for no more than 5 hours between eating.  If you eat breakfast, please do so within one hour of getting up.    Each meal should contain half fruits/vegetables, one quarter protein, and one quarter carbs (no bigger than a computer mouse)   Cut down on sweet beverages. This includes juice, soda, and sweet tea.     Drink at least 1 glass of water with each meal and aim for at least 8 glasses per day   Exercise at least 150 minutes every week.    Occipital Neuralgia  Occipital neuralgia is a type of headache that causes brief episodes of very bad pain in the back of your head. Pain from occipital neuralgia may spread (radiate) to other parts of your head. These headaches may be caused by irritation of the nerves that leave your spinal cord high up in your neck, just below the base of your skull (occipital nerves). Your occipital nerves transmit sensations from the back of your head, the top of your head, and the areas behind your ears. What are the causes? This condition can occur without any known cause (primary headache syndrome). In other cases, this condition is caused by pressure on or irritation of one of the two occipital nerves. Pressure and irritation may be due to:  Muscle spasm in the neck.  Neck injury.  Wear and tear of the vertebrae in the neck (osteoarthritis).  Disease of the disks that separate the vertebrae.  Swollen blood vessels that put pressure on the occipital nerves.  Infections.  Tumors.  Diabetes. What are the signs or  symptoms? This condition causes brief burning, stabbing, electric, shocking, or shooting pain which can radiate to the top of the head. It can happen on one side or both sides of the head. It can also cause:  Pain behind the eye.  Pain triggered by neck movement or hair brushing.  Scalp tenderness.  Aching in the back of the head between episodes of very bad pain.  Pain gets worse with exposure to bright lights. How is this diagnosed? There is no test that diagnoses this condition. Your health care provider may diagnose this condition based on a physical exam and your symptoms. Other tests may be done, such as:  Imaging studies of the brain and neck (cervical spine), such as an MRI or CT scan. These look for causes of pinched nerves.  Applying pressure to the nerves in the neck to try to re-create the pain.  Injection of numbing medicine into the occipital nerve areas to see if pain goes away (diagnostic nerve block). How is this treated? Treatment for this condition may begin with simple measures, such as:  Rest.  Massage.  Applying heat or cold on the area.  Over-the-counter pain relievers. If these measures do not work, you may need other treatments, including:  Medicines, such as: ? Prescription-strength anti-inflammatory medicines. ? Muscle relaxants. ? Anti-seizure medicines, which can relieve pain. ? Antidepressants, which can relieve pain. ?  Injected medicines, such as medicines that numb the area (local anesthetic) and steroids.  Pulsed radiofrequency ablation. This is when wires are implanted to deliver electrical impulses that block pain signals from the occipital nerve.  Surgery to relieve nerve pressure.  Physical therapy. Follow these instructions at home: Pain management      Avoid any activities that cause pain.  Rest when you have an attack of pain.  Try gentle massage to relieve pain.  Try a different pillow or sleeping position.  If  directed, apply heat to the affected area as told by your health care provider. Use the heat source that your health care provider recommends, such as a moist heat pack or a heating pad. ? Place a towel between your skin and the heat source. ? Leave the heat on for 20-30 minutes. ? Remove the heat if your skin turns bright red. This is especially important if you are unable to feel pain, heat, or cold. You may have a greater risk of getting burned.  If directed, apply ice to the back of the head and neck area as told by your health care provider. ? Put ice in a plastic bag. ? Place a towel between your skin and the bag. ? Leave the ice on for 20 minutes, 2-3 times per day. General instructions  Take over-the-counter and prescription medicines only as told by your health care provider.  Avoid things that make your symptoms worse, such as bright lights.  Try to stay active. Get regular exercise that does not cause pain. Ask your health care provider to suggest safe exercises for you.  Work with a physical therapist to learn stretching exercises you can do at home.  Practice good posture.  Keep all follow-up visits as told by your health care provider. This is important. Contact a health care provider if:  Your medicine is not working.  You have new or worsening symptoms. Get help right away if:  You have very bad head pain that does not go away.  You have a sudden change in vision, balance, or speech. Summary  Occipital neuralgia is a type of headache that causes brief episodes of very bad pain in the back of your head.  Pain from occipital neuralgia may spread (radiate) to other parts of your head.  Treatment for this condition includes rest, massage, and medicines. This information is not intended to replace advice given to you by your health care provider. Make sure you discuss any questions you have with your health care provider. Document Revised: 12/15/2016 Document  Reviewed: 03/05/2016 Elsevier Patient Education  [employer] [place].

## 2019-02-22 NOTE — Assessment & Plan Note (Signed)
Stable.  Continue Prilosec 20 mg daily. 

## 2019-02-23 NOTE — Progress Notes (Signed)
Please inform patient of the following:  Xray overall stable but does have increased granulomas compared to previous. Recommend pulmonology referral. Please place referral if patient is willing.  Mason Vargas. [name], MD 02/23/2019 12:56 PM

## 2019-02-24 ENCOUNTER — Other Ambulatory Visit: Payer: Self-pay

## 2019-02-24 DIAGNOSIS — R9389 Abnormal findings on diagnostic imaging of other specified body structures: Secondary | ICD-10-CM

## 2019-03-07 MED FILL — OMEPRAZOLE DR 20 MG CAPSULE: 20 | 90 days supply | Qty: 90 | Fill #3

## 2019-03-20 ENCOUNTER — Other Ambulatory Visit: Payer: Self-pay

## 2019-03-21 ENCOUNTER — Encounter: Payer: Self-pay | Admitting: Family Medicine

## 2019-03-21 ENCOUNTER — Ambulatory Visit (INDEPENDENT_AMBULATORY_CARE_PROVIDER_SITE_OTHER): Payer: Commercial Managed Care - PPO | Admitting: Family Medicine

## 2019-03-21 VITALS — BP 126/80 | HR 88 | Temp 97.6°F | Ht 72.0 in | Wt 215.2 lb

## 2019-03-21 DIAGNOSIS — I10 Essential (primary) hypertension: Secondary | ICD-10-CM | POA: Diagnosis not present

## 2019-03-21 DIAGNOSIS — E559 Vitamin D deficiency, unspecified: Secondary | ICD-10-CM

## 2019-03-21 DIAGNOSIS — Z125 Encounter for screening for malignant neoplasm of prostate: Secondary | ICD-10-CM | POA: Diagnosis not present

## 2019-03-21 DIAGNOSIS — Z0001 Encounter for general adult medical examination with abnormal findings: Secondary | ICD-10-CM | POA: Diagnosis not present

## 2019-03-21 DIAGNOSIS — E785 Hyperlipidemia, unspecified: Secondary | ICD-10-CM | POA: Diagnosis not present

## 2019-03-21 DIAGNOSIS — K219 Gastro-esophageal reflux disease without esophagitis: Secondary | ICD-10-CM

## 2019-03-21 DIAGNOSIS — Z6829 Body mass index (BMI) 29.0-29.9, adult: Secondary | ICD-10-CM

## 2019-03-21 LAB — COMPREHENSIVE METABOLIC PANEL
ALT: 27 U/L (ref 0–53)
AST: 20 U/L (ref 0–37)
Albumin: 4.3 g/dL (ref 3.5–5.2)
Alkaline Phosphatase: 96 U/L (ref 39–117)
BUN: 15 mg/dL (ref 6–23)
CO2: 28 mEq/L (ref 19–32)
Calcium: 9.1 mg/dL (ref 8.4–10.5)
Chloride: 104 mEq/L (ref 96–112)
Creatinine, Ser: 0.77 mg/dL (ref 0.40–1.50)
GFR: 102.02 mL/min (ref >60.00)
Glucose, Bld: 109 mg/dL — ABNORMAL HIGH (ref 70–99)
Potassium: 4 mEq/L (ref 3.5–5.1)
Sodium: 139 mEq/L (ref 135–145)
Total Bilirubin: 1.7 mg/dL — ABNORMAL HIGH (ref 0.2–1.2)
Total Protein: 6.7 g/dL (ref 6.0–8.3)

## 2019-03-21 LAB — CBC
HCT: 44.1 % (ref 39.0–52.0)
Hemoglobin: 15.1 g/dL (ref 13.0–17.0)
MCHC: 34.3 g/dL (ref 30.0–36.0)
MCV: 80.7 fl (ref 78.0–100.0)
Platelets: 210 10*3/uL (ref 150.0–400.0)
RBC: 5.46 Mil/uL (ref 4.22–5.81)
RDW: 13.8 % (ref 11.5–15.5)
WBC: 5 10*3/uL (ref 4.0–10.5)

## 2019-03-21 LAB — LIPID PANEL
Cholesterol: 107 mg/dL (ref 0–200)
HDL: 35.1 mg/dL — ABNORMAL LOW (ref >39.00)
LDL Cholesterol: 55 mg/dL (ref 0–99)
NonHDL: 71.68
Total CHOL/HDL Ratio: 3
Triglycerides: 82 mg/dL (ref 0.0–149.0)
VLDL: 16.4 mg/dL (ref 0.0–40.0)

## 2019-03-21 LAB — TSH: TSH: 2.89 u[IU]/mL (ref 0.35–4.50)

## 2019-03-21 LAB — PSA: PSA: 0.31 ng/mL (ref 0.10–4.00)

## 2019-03-21 NOTE — Progress Notes (Signed)
Chief Complaint:  Mason Vargas is a 64 y.o. male who presents today for his annual comprehensive physical exam.    Assessment/Plan:  Chronic Problems Addressed Today: Hyperlipidemia Continue Lipitor 20 mg daily.  Check lipid panel, CBC, C met, TSH.  Vitamin D deficiency Recommended 1000-2000 IU daily.  Patient declined vitamin D testing today.  Hypertension At goal.  Continue losartan-HCTZ 50-12 point 5/2 tablet once daily.  GERD (gastroesophageal reflux disease) Continue omeprazole 29m daily.   Body mass index is 29.19 kg/m. / Overweight BMI Metric Follow Up - 03/21/19 1000      BMI Metric Follow Up-Please document annually   BMI Metric Follow Up  Education provided       Preventative Healthcare: Check PSA, CBC, C met, TSH, lipid panel.  Due for colonoscopy later this year.  Patient Counseling(The following topics were reviewed and/or handout was given):  -Nutrition: Stressed importance of moderation in sodium/caffeine intake, saturated fat and cholesterol, caloric balance, sufficient intake of fresh fruits, vegetables, and fiber.  -Stressed the importance of regular exercise.   -Substance Abuse: Discussed cessation/primary prevention of tobacco, alcohol, or other drug use; driving or other dangerous activities under the influence; availability of treatment for abuse.   -Injury prevention: Discussed safety belts, safety helmets, smoke detector, smoking near bedding or upholstery.   -Sexuality: Discussed sexually transmitted diseases, partner selection, use of condoms, avoidance of unintended pregnancy and contraceptive alternatives.   -Dental health: Discussed importance of regular tooth brushing, flossing, and dental visits.  -Health maintenance and immunizations reviewed. Please refer to Health maintenance section.  Return to care in 1 year for next preventative visit.     Subjective:  HPI:  He has no acute complaints today.   Lifestyle Diet: Watching sugars  carbs.  Exercise: Limited due to low back pain with sciatica   [facility]  Decreased Interest 0  Down, Depressed, Hopeless 0  PHQ - 2 Score 0  Altered sleeping -  Tired, decreased energy -  Change in appetite -  Feeling bad or failure about yourself  -  Trouble concentrating -  Moving slowly or fidgety/restless -  Suicidal thoughts -  PHQ-9 Score -    There are no preventive care reminders to display for this patient.   ROS: Per HPI, otherwise a complete review of systems was negative.   PMH:  The following were reviewed and entered/updated in epic: Past Medical History:  Diagnosis Date  . GERD (gastroesophageal reflux disease)   . Hyperlipidemia   . Hypertension   . Insomnia   . Lumbar herniated disc   . Sciatica 04/08/2018   Left leg  . Snores    Patient Active Problem List   Diagnosis Date Noted  . Abnormal chest x-ray 02/24/2019  . GERD (gastroesophageal reflux disease) 03/22/2018  . Hypertension 03/22/2018  . Lumbar herniated disc 03/22/2014  . BPH (benign prostatic hyperplasia) 03/03/2012  . Family history of ischemic heart disease (IHD) 08/10/2011  . Colon adenoma 06/11/2011  . Vitamin D deficiency 06/11/2011  . ED (erectile dysfunction) 06/02/2011  . Hyperlipidemia 03/22/2002   Past Surgical History:  Procedure Laterality Date  . CLOSED REDUCTION NASAL FRACTURE N/A 05/26/2018   Procedure: CLOSED REDUCTION NASAL FRACTURE;  Surgeon: NRozetta Nunnery MD;  [place]  Service: E[place];  Laterality: N/A;  . COLONOSCOPY W/ POLYPECTOMY      Family History  Problem Relation Age of Onset  . Heart attack Father   . Heart Problems  Father   . Hypertension Father   . Hyperlipidemia Father   . Colon cancer Neg Hx   . Pancreatic cancer Neg Hx   . Stomach cancer Neg Hx     Medications- reviewed and updated Current Outpatient Medications  Medication Sig Dispense Refill  . aspirin 81 MG tablet Take 81 mg by  mouth daily.    Marland Kitchen atorvastatin (LIPITOR) 20 MG tablet Take 1 tablet (20 mg total) by mouth at bedtime. 90 tablet 3  . losartan-hydrochlorothiazide (HYZAAR) 50-12.5 MG tablet Take 0.5 tablets by mouth daily. 90 tablet 3  . omeprazole (PRILOSEC) 20 MG capsule Take 1 capsule (20 mg total) by mouth daily. 90 capsule 3   No current facility-administered medications for this visit.    Allergies-reviewed and updated No Known Allergies  Social History   Socioeconomic History  . Marital status: Married    Spouse name: Not on file  . Number of children: 0  . Years of education: Not on file  . Highest education level: Some college, no degree  Occupational History  . Not on file  Tobacco Use  . Smoking status: Never Smoker  . Smokeless tobacco: Never Used  Substance and Sexual Activity  . Alcohol use: Yes    Alcohol/week: 1.0 standard drinks    Types: 1 Standard drinks or equivalent per week    Comment: occasionally  . Drug use: No  . Sexual activity: Yes    Birth control/protection: None  Other Topics Concern  . Not on file  Social History Narrative   Right handed    Caffeine 33 -4 cups daily    Social Determinants of Health   Financial Resource Strain:   . Difficulty of Paying Living Expenses: Not on file  Food Insecurity:   . Worried About Charity fundraiser in the Last Year: Not on file  . Ran Out of Food in the Last Year: Not on file  Transportation Needs:   . Lack of Transportation (Medical): Not on file  . Lack of Transportation (Non-Medical): Not on file  Physical Activity:   . Days of Exercise per Week: Not on file  . Minutes of Exercise per Session: Not on file  Stress:   . Feeling of Stress : Not on file  Social Connections:   . Frequency of Communication with Friends and Family: Not on file  . Frequency of Social Gatherings with Friends and Family: Not on file  . Attends Religious Services: Not on file  . Active Member of Clubs or Organizations: Not on file   . Attends Archivist Meetings: Not on file  . Marital Status: Not on file        Objective:  Physical Exam: BP 126/80   Pulse 88   Temp 97.6 F (36.4 C)   Ht 6' (1.829 m)   Wt 215 lb 4 oz (97.6 kg)   SpO2 98%   BMI 29.19 kg/m   Body mass index is 29.19 kg/m. Wt Readings from Last 3 Encounters:  03/21/19 215 lb 4 oz (97.6 kg)  02/22/19 211 lb 6.1 oz (95.9 kg)  05/26/18 208 lb 5.4 oz (94.5 kg)   Gen: NAD, resting comfortably HEENT: TMs normal bilaterally. OP clear. No thyromegaly noted.  CV: RRR with no murmurs appreciated Pulm: NWOB, CTAB with no crackles, wheezes, or rhonchi GI: Normal bowel sounds present. Soft, Nontender, Nondistended. MSK: no edema, cyanosis, or clubbing noted Skin: warm, dry Neuro: CN2-12 grossly intact. Strength 5/5 in upper and lower  extremities. Reflexes symmetric and intact bilaterally.  Psych: Normal affect and thought content     Analina Filla M. [name], MD 03/21/2019 10:05 AM

## 2019-03-21 NOTE — Patient Instructions (Signed)
It was very nice to see you today!  Keep up the good work!  No changes today.  It would be a good idea for you to take at least 1 to 2000 international units of vitamin D daily.  It is okay for you to take 5000 international units daily as well.  We will check blood work today.  You will need a colonoscopy later this year.   Come back to see me in 1 year for your next physical, or sooner if needed.  Take care, Dr [name]  Please try these tips to maintain a healthy lifestyle:   Eat at least 3 REAL meals and 1-2 snacks per day.  Aim for no more than 5 hours between eating.  If you eat breakfast, please do so within one hour of getting up.    Each meal should contain half fruits/vegetables, one quarter protein, and one quarter carbs (no bigger than a computer mouse)   Cut down on sweet beverages. This includes juice, soda, and sweet tea.     Drink at least 1 glass of water with each meal and aim for at least 8 glasses per day   Exercise at least 150 minutes every week.    Preventive Care 67-50 Years Old, Male Preventive care refers to lifestyle choices and visits with your health care provider that can promote health and wellness. This includes:  A yearly physical exam. This is also called an annual well check.  Regular dental and eye exams.  Immunizations.  Screening for certain conditions.  Healthy lifestyle choices, such as eating a healthy diet, getting regular exercise, not using drugs or products that contain nicotine and tobacco, and limiting alcohol use. What can I expect for my preventive care visit? Physical exam Your health care provider will check:  Height and weight. These may be used to calculate body mass index (BMI), which is a measurement that tells if you are at a healthy weight.  Heart rate and blood pressure.  Your skin for abnormal spots. Counseling Your health care provider may ask you questions about:  Alcohol, tobacco, and drug  use.  Emotional well-being.  Home and relationship well-being.  Sexual activity.  Eating habits.  Work and work Statistician. What immunizations do I need?  Influenza (flu) vaccine  This is recommended every year. Tetanus, diphtheria, and pertussis (Tdap) vaccine  You may need a Td booster every 10 years. Varicella (chickenpox) vaccine  You may need this vaccine if you have not already been vaccinated. Zoster (shingles) vaccine  You may need this after age 47. Measles, mumps, and rubella (MMR) vaccine  You may need at least one dose of MMR if you were born in 1957 or later. You may also need a second dose. Pneumococcal conjugate (PCV13) vaccine  You may need this if you have certain conditions and were not previously vaccinated. Pneumococcal polysaccharide (PPSV23) vaccine  You may need one or two doses if you smoke cigarettes or if you have certain conditions. Meningococcal conjugate (MenACWY) vaccine  You may need this if you have certain conditions. Hepatitis A vaccine  You may need this if you have certain conditions or if you travel or work in places where you may be exposed to hepatitis A. Hepatitis B vaccine  You may need this if you have certain conditions or if you travel or work in places where you may be exposed to hepatitis B. Haemophilus influenzae type b (Hib) vaccine  You may need this if you have certain  risk factors. Human papillomavirus (HPV) vaccine  If recommended by your health care provider, you may need three doses over 6 months. You may receive vaccines as individual doses or as more than one vaccine together in one shot (combination vaccines). Talk with your health care provider about the risks and benefits of combination vaccines. What tests do I need? Blood tests  Lipid and cholesterol levels. These may be checked every 5 years, or more frequently if you are over 48 years old.  Hepatitis C test.  Hepatitis B test. Screening  Lung  cancer screening. You may have this screening every year starting at age 23 if you have a 30-pack-year history of smoking and currently smoke or have quit within the past 15 years.  Prostate cancer screening. Recommendations will vary depending on your family history and other risks.  Colorectal cancer screening. All adults should have this screening starting at age 71 and continuing until age 76. Your health care provider may recommend screening at age 59 if you are at increased risk. You will have tests every 1-10 years, depending on your results and the type of screening test.  Diabetes screening. This is done by checking your blood sugar (glucose) after you have not eaten for a while (fasting). You may have this done every 1-3 years.  Sexually transmitted disease (STD) testing. Follow these instructions at home: Eating and drinking  Eat a diet that includes fresh fruits and vegetables, whole grains, lean protein, and low-fat dairy products.  Take vitamin and mineral supplements as recommended by your health care provider.  Do not drink alcohol if your health care provider tells you not to drink.  If you drink alcohol: ? Limit how much you have to 0-2 drinks a day. ? Be aware of how much alcohol is in your drink. In the [place] one drink equals one 12 oz bottle of beer (355 mL), one 5 oz glass of wine (148 mL), or one 1 oz glass of hard liquor (44 mL). Lifestyle  Take daily care of your teeth and gums.  Stay active. Exercise for at least 30 minutes on 5 or more days each week.  Do not use any products that contain nicotine or tobacco, such as cigarettes, e-cigarettes, and chewing tobacco. If you need help quitting, ask your health care provider.  If you are sexually active, practice safe sex. Use a condom or other form of protection to prevent STIs (sexually transmitted infections).  Talk with your health care provider about taking a low-dose aspirin every day starting at age  105. What's next?  Go to your health care provider once a year for a well check visit.  Ask your health care provider how often you should have your eyes and teeth checked.  Stay up to date on all vaccines. This information is not intended to replace advice given to you by your health care provider. Make sure you discuss any questions you have with your health care provider. Document Revised: 12/23/2017 Document Reviewed: 12/23/2017 Elsevier Patient Education  2020 [employer].

## 2019-03-21 NOTE — Assessment & Plan Note (Signed)
-  Continue omeprazole 20 mg daily

## 2019-03-21 NOTE — Assessment & Plan Note (Signed)
Continue Lipitor 20 mg daily.  Check lipid panel, CBC, C met, TSH.

## 2019-03-21 NOTE — Assessment & Plan Note (Signed)
Recommended 1000-2000 IU daily.  Patient declined vitamin D testing today.

## 2019-03-21 NOTE — Assessment & Plan Note (Signed)
At goal.  Continue losartan-HCTZ 50-12 point 5/2 tablet once daily.

## 2019-03-22 ENCOUNTER — Other Ambulatory Visit: Payer: Self-pay

## 2019-03-22 DIAGNOSIS — R17 Unspecified jaundice: Secondary | ICD-10-CM

## 2019-03-22 NOTE — Progress Notes (Signed)
Please inform patient of the following:  Blood sugar borderline but better than last year.  Bilirubin elevated, probably nothing to worry about but would like for him to come back to recheck. Please place future order for fractionated bilirubin.  His "good" cholesterol was better but still a bit low.  Do not need to make any other changes to his treatment plan. We can recheck everything else next year.

## 2019-03-23 ENCOUNTER — Other Ambulatory Visit (INDEPENDENT_AMBULATORY_CARE_PROVIDER_SITE_OTHER): Payer: Commercial Managed Care - PPO

## 2019-03-23 ENCOUNTER — Other Ambulatory Visit: Payer: Self-pay

## 2019-03-23 DIAGNOSIS — R17 Unspecified jaundice: Secondary | ICD-10-CM | POA: Diagnosis not present

## 2019-03-24 LAB — BILIRUBIN, FRACTIONATED(TOT/DIR/INDIR)
Bilirubin, Direct: 0.3 mg/dL — ABNORMAL HIGH (ref 0.0–0.2)
Indirect Bilirubin: 0.8 mg/dL (calc) (ref 0.2–1.2)
Total Bilirubin: 1.1 mg/dL (ref 0.2–1.2)

## 2019-03-27 ENCOUNTER — Telehealth: Payer: Self-pay

## 2019-03-27 NOTE — Telephone Encounter (Signed)
Left message for patient to call clinic

## 2019-03-27 NOTE — Telephone Encounter (Signed)
Patient returned call regarding labs. Patient said that the only time he have today is 2pm-3pm or tomorrow morning

## 2019-03-27 NOTE — Progress Notes (Signed)
Please inform patient of the following:  Bilirubin levels back to normal. We can recheck in 1 year.  Mason Vargas. [name], MD 03/27/2019 9:25 AM

## 2019-04-04 MED FILL — ATORVASTATIN 20 MG TABLET: 20 | 90 days supply | Qty: 90 | Fill #3

## 2019-05-08 MED FILL — LOSARTAN-HCTZ 50-12.5 MG TA: 50-12.5 | 90 days supply | Qty: 45 | Fill #3

## 2019-05-29 ENCOUNTER — Other Ambulatory Visit: Payer: Self-pay | Admitting: Family Medicine

## 2019-05-29 DIAGNOSIS — E785 Hyperlipidemia, unspecified: Secondary | ICD-10-CM

## 2019-06-01 MED FILL — ATORVASTATIN 20 MG TABLET: 20 | 90 days supply | Qty: 90 | Fill #0

## 2019-06-01 MED FILL — OMEPRAZOLE DR 20 MG CAPSULE: 20 | 90 days supply | Qty: 90 | Fill #0

## 2019-06-12 ENCOUNTER — Encounter: Payer: Self-pay | Admitting: Family Medicine

## 2019-06-13 NOTE — Telephone Encounter (Signed)
Please advise 

## 2019-06-16 ENCOUNTER — Other Ambulatory Visit: Payer: Self-pay | Admitting: Family Medicine

## 2019-06-16 NOTE — Telephone Encounter (Signed)
Last OV 03/21/19 Last refill - not on current med list

## 2019-06-21 ENCOUNTER — Other Ambulatory Visit: Payer: Self-pay | Admitting: Family Medicine

## 2019-06-23 ENCOUNTER — Encounter: Payer: Self-pay | Admitting: Emergency Medicine

## 2019-06-23 ENCOUNTER — Other Ambulatory Visit: Payer: Self-pay

## 2019-06-23 ENCOUNTER — Ambulatory Visit: Payer: Commercial Managed Care - PPO | Admitting: Emergency Medicine

## 2019-06-23 DIAGNOSIS — R9389 Abnormal findings on diagnostic imaging of other specified body structures: Secondary | ICD-10-CM

## 2019-06-23 NOTE — Patient Instructions (Addendum)
Chest imaging going back to 2010 shows numerous punctate calcified granulomas in the lungs.  This is likely a response to your history of spelunking, living in [place].  This is a benign finding. If you notice new symptoms including shortness of breath, chest discomfort, cough or if you notice swollen lymph nodes, new persistent rash then please return to follow-up.  We can evaluate for potential other causes of granulomatous disease if there is any evidence to support this.

## 2019-06-23 NOTE — Assessment & Plan Note (Signed)
Innumerable calcified granulomas.  Not characteristic of sarcoidosis.  Consider histoplasmosis since he lived in the .  I suspect that this was due to his history of spelunking.  No evidence for mediastinal calcium for fibrosing mediastinitis.  If he developed new symptoms then we could pursue further, perform CT chest, ACE level, hypersensitivity pneumonitis panel, etc.  I tried to reassure him.  I think this is a benign finding and likely will never cause symptoms.  He will follow up with me if he has a clinical change.  Chest imaging going back to 2010 shows numerous punctate calcified granulomas in the lungs.  This is likely a response to your history of spelunking, living in [place].  This is a benign finding. If you notice new symptoms including shortness of breath, chest discomfort, cough or if you notice swollen lymph nodes, new persistent rash then please return to follow-up.  We can evaluate for potential other causes of granulomatous disease if there is any evidence to support this.

## 2019-06-23 NOTE — Progress Notes (Signed)
Subjective:    Patient ID: Mason Vargas, male    DOB: [birth year], 64 y.o.   MRN: [id number]  HPI 64 year old man, never smoker, with a history of hypertension, hyperlipidemia, GERD.  Referred today for an abnormal chest x-ray. He reports that he first was told that he had an abnormal CXR about 2010.  He does not have SOB or CP, he can hear some crackling noise with exhalation when supine. He was doing a lot of throat clearing in the past, has some mild allergic disease No LAD. No rashes  Chest x-ray 02/22/2019 reviewed by me, shows good aeration, no infiltrates, multiple calcified granulomas bilaterally. Reviewed multiple CXR's, CT abdomen from 2012   Review of Systems As per HPI   Past Medical History:  Diagnosis Date  . GERD (gastroesophageal reflux disease)   . Hyperlipidemia   . Hypertension   . Insomnia   . Lumbar herniated disc   . Sciatica 04/08/2018   Left leg  . Snores      Family History  Problem Relation Age of Onset  . Heart attack Father   . Heart Problems Father   . Hypertension Father   . Hyperlipidemia Father   . Colon cancer Neg Hx   . Pancreatic cancer Neg Hx   . Stomach cancer Neg Hx      Social History   Socioeconomic History  . Marital status: Married    Spouse name: Not on file  . Number of children: 0  . Years of education: Not on file  . Highest education level: Some college, no degree  Occupational History  . Not on file  Tobacco Use  . Smoking status: Never Smoker  . Smokeless tobacco: Never Used  Vaping Use  . Vaping Use: Never used  Substance and Sexual Activity  . Alcohol use: Yes    Alcohol/week: 1.0 standard drink    Types: 1 Standard drinks or equivalent per week    Comment: occasionally  . Drug use: No  . Sexual activity: Yes    Birth control/protection: None  Other Topics Concern  . Not on file  Social History Narrative   Right handed    Caffeine 33 -4 cups daily    Social Determinants of Health   Financial  Resource Strain:   . Difficulty of Paying Living Expenses:   Food Insecurity:   . Worried About Charity fundraiser in the Last Year:   . Arboriculturist in the Last Year:   Transportation Needs:   . Film/video editor (Medical):   Marland Kitchen Lack of Transportation (Non-Medical):   Physical Activity:   . Days of Exercise per Week:   . Minutes of Exercise per Session:   Stress:   . Feeling of Stress :   Social Connections:   . Frequency of Communication with Friends and Family:   . Frequency of Social Gatherings with Friends and Family:   . Attends Religious Services:   . Active Member of Clubs or Organizations:   . Attends Archivist Meetings:   Marland Kitchen Marital Status:   Intimate Partner Violence:   . Fear of Current or Ex-Partner:   . Emotionally Abused:   Marland Kitchen Physically Abused:   . Sexually Abused:     Has lived in [place], [place], [place] He did a lot of Spelunking when he was younger; use to scuba dive.  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 aspirin 81 MG tablet Take 81 mg by mouth daily.    Marland Kitchen atorvastatin (LIPITOR) 20 MG tablet TAKE 1 TABLET (20 MG TOTAL) BY MOUTH AT BEDTIME. 90 tablet 3  . losartan-hydrochlorothiazide (HYZAAR) 50-12.5 MG tablet Take 0.5 tablets by mouth daily. 90 tablet 3  . metoprolol tartrate (LOPRESSOR) 25 MG tablet TAKE 1 TABLET (25 MG TOTAL) BY MOUTH TWICE A DAY 60 tablet 0  . omeprazole (PRILOSEC) 20 MG capsule TAKE 1 CAPSULE (20 MG TOTAL) BY MOUTH DAILY. 90 capsule 3   No facility-administered medications prior to visit.        Objective:   Physical Exam  Vitals:   06/23/19 1515  BP: 140/80  Pulse: 87  Temp: 98.2 F (36.8 C)  TempSrc: Oral  SpO2: 98%   Gen: Pleasant, well-nourished, in no distress,  normal affect  ENT: No lesions,  mouth clear,  oropharynx clear, no postnasal drip  Neck: No JVD, no  stridor  Lungs: No use of accessory muscles, no crackles or wheezing on normal respiration, no wheeze on forced expiration  Cardiovascular: RRR, heart sounds normal, no murmur or gallops, no peripheral edema  Musculoskeletal: No deformities, no cyanosis or clubbing  Neuro: alert, awake, non focal  Skin: Warm, no lesions or rash       Assessment & Plan:  Abnormal chest x-ray Innumerable calcified granulomas.  Not characteristic of sarcoidosis.  Consider histoplasmosis since he lived in the [place].  I suspect that this was due to his history of spelunking.  No evidence for mediastinal calcium for fibrosing mediastinitis.  If he developed new symptoms then we could pursue further, perform CT chest, ACE level, hypersensitivity pneumonitis panel, etc.  I tried to reassure him.  I think this is a benign finding and likely will never cause symptoms.  He will follow up with me if he has a clinical change.  Chest imaging going back to 2010 shows numerous punctate calcified granulomas in the lungs.  This is likely a response to your history of spelunking, living in [place].  This is a benign finding. If you notice new symptoms including shortness of breath, chest discomfort, cough or if you notice swollen lymph nodes, new persistent rash then please return to follow-up.  We can evaluate for potential other causes of granulomatous disease if there is any evidence to support this.  Baltazar Apo, MD, PhD 06/23/2019, 4:09 PM [facility] [phone] or if no answer [phone]

## 2019-09-01 MED FILL — OMEPRAZOLE DR 20 MG CAPSULE: 20 | 90 days supply | Qty: 90 | Fill #1

## 2019-09-19 ENCOUNTER — Other Ambulatory Visit: Payer: Self-pay | Admitting: Family Medicine

## 2019-09-19 DIAGNOSIS — I10 Essential (primary) hypertension: Secondary | ICD-10-CM

## 2019-09-19 MED FILL — LOSARTAN-HCTZ 50-12.5 MG TA: 50-12.5 | 90 days supply | Qty: 45 | Fill #0

## 2019-09-27 MED FILL — ATORVASTATIN 20 MG TABLET: 20 | 90 days supply | Qty: 90 | Fill #1

## 2019-10-13 ENCOUNTER — Other Ambulatory Visit: Payer: Self-pay

## 2019-10-13 ENCOUNTER — Ambulatory Visit (INDEPENDENT_AMBULATORY_CARE_PROVIDER_SITE_OTHER): Payer: Commercial Managed Care - PPO | Admitting: Family Medicine

## 2019-10-13 ENCOUNTER — Encounter: Payer: Self-pay | Admitting: Family Medicine

## 2019-10-13 VITALS — BP 131/90 | HR 74 | Temp 98.2°F | Ht 72.0 in | Wt 176.4 lb

## 2019-10-13 DIAGNOSIS — R002 Palpitations: Secondary | ICD-10-CM | POA: Diagnosis not present

## 2019-10-13 DIAGNOSIS — E559 Vitamin D deficiency, unspecified: Secondary | ICD-10-CM | POA: Diagnosis not present

## 2019-10-13 DIAGNOSIS — R109 Unspecified abdominal pain: Secondary | ICD-10-CM

## 2019-10-13 NOTE — Assessment & Plan Note (Signed)
Check vit D level. 

## 2019-10-13 NOTE — Patient Instructions (Signed)
It was very nice to see you today!  You are probably having benign premature beats.  We will check a Holter monitor to make sure there is nothing else is going on.  Also check labs today.  Your most likely having gas pains.  Please try taking simethicone and probiotic.  Please let me know if you develop any additional symptoms including nausea, vomiting, blood in your stool, or worsening or more frequent pain.  Take care, Dr [name]  Please try these tips to maintain a healthy lifestyle:   Eat at least 3 REAL meals and 1-2 snacks per day.  Aim for no more than 5 hours between eating.  If you eat breakfast, please do so within one hour of getting up.    Each meal should contain half fruits/vegetables, one quarter protein, and one quarter carbs (no bigger than a computer mouse)   Cut down on sweet beverages. This includes juice, soda, and sweet tea.     Drink at least 1 glass of water with each meal and aim for at least 8 glasses per day   Exercise at least 150 minutes every week.

## 2019-10-13 NOTE — Progress Notes (Signed)
   Mason Vargas is a 64 y.o. male who presents today for an office visit.  Assessment/Plan:  New/Acute Problems: Palpitations No red flags.  EKG today without any signs of ischemic changes or arrhythmias.  Will check labs including CBC and CMET.  Also check TSH.  Will check Holter monitor to rule out other arrhythmias  Abdominal pain No red flags.  Reassuring abdominal exam.  Likely gas pains related to dietary changes and increased xylitol intake.  Will check labs as above and also H. pylori.  Recommended simethicone and probiotics.  Discussed reasons to return to care.  Chronic Problems Addressed Today: Vitamin D deficiency Check vit D level.     Subjective:  HPI:  Patient here for 2 concerns described below.  He was last seen about 7 months ago for CPE.  Since then he has been working on weight loss.  He has been cutting out carbs and making significant changes to his diet.  Has lost about 40 pounds intentionally over the last several months.  Over the last couple months he has had 2 episodes of a deep ache in his upper abdomen.  Most recently happened a few weeks ago.  Tried taking Tums with simethicone which seemed to help.  He also has noticed more flatulence and gas buildup.  He has made significant changes to his diet and has been taking more xylitol and fiber.  Symptoms occur randomly.  Not worse with food.  No nausea or vomiting.  No constipation or diarrhea.  No blood in the stool.  No fevers or chills.  Has also had intermittent palpitations for the past several months.  Episodes can last for up to an hour at a time.  He has recently tried cutting back on caffeine which seemed to help.  No reported chest pain or shortness of breath.  No reported dizziness.      Objective:  Physical Exam: BP 131/90   Pulse 74   Temp 98.2 F (36.8 C) (Temporal)   Ht 6' (1.829 m)   Wt 176 lb 6.4 oz (80 kg)   SpO2 99%   BMI 23.92 kg/m   Wt Readings from Last 3 Encounters:  10/13/19 176  lb 6.4 oz (80 kg)  03/21/19 215 lb 4 oz (97.6 kg)  02/22/19 211 lb 6.1 oz (95.9 kg)    Gen: No acute distress, resting comfortably CV: Regular rate and rhythm with no murmurs appreciated Pulm: Normal work of breathing, clear to auscultation bilaterally with no crackles, wheezes, or rhonchi GI: Bowel sounds present, soft, nontender, nondistended Neuro: Grossly normal, moves all extremities Psych: Normal affect and thought content  EKG: NSR, no ischemic changes  Time Spent: 49 minutes of total time was spent on the date of the encounter performing the following actions: chart review prior to seeing the patient, obtaining history, performing a medically necessary exam, counseling on the treatment plan, placing orders, and documenting in our EHR.        [name]. [name], MD 10/13/2019 9:32 AM

## 2019-10-14 LAB — COMPREHENSIVE METABOLIC PANEL
AG Ratio: 2.3 (calc) (ref 1.0–2.5)
ALT: 30 U/L (ref 9–46)
AST: 18 U/L (ref 10–35)
Albumin: 4.3 g/dL (ref 3.6–5.1)
Alkaline phosphatase (APISO): 93 U/L (ref 35–144)
BUN: 19 mg/dL (ref 7–25)
CO2: 27 mmol/L (ref 20–32)
Calcium: 9.3 mg/dL (ref 8.6–10.3)
Chloride: 104 mmol/L (ref 98–110)
Creat: 0.7 mg/dL (ref 0.70–1.25)
Globulin: 1.9 g/dL (calc) (ref 1.9–3.7)
Glucose, Bld: 103 mg/dL — ABNORMAL HIGH (ref 65–99)
Potassium: 4.1 mmol/L (ref 3.5–5.3)
Sodium: 138 mmol/L (ref 135–146)
Total Bilirubin: 1.5 mg/dL — ABNORMAL HIGH (ref 0.2–1.2)
Total Protein: 6.2 g/dL (ref 6.1–8.1)

## 2019-10-14 LAB — CBC
HCT: 46.2 % (ref 38.5–50.0)
Hemoglobin: 14.7 g/dL (ref 13.2–17.1)
MCH: 27.2 pg (ref 27.0–33.0)
MCHC: 31.8 g/dL — ABNORMAL LOW (ref 32.0–36.0)
MCV: 85.6 fL (ref 80.0–100.0)
MPV: 9.5 fL (ref 7.5–12.5)
Platelets: 179 10*3/uL (ref 140–400)
RBC: 5.4 10*6/uL (ref 4.20–5.80)
RDW: 13 % (ref 11.0–15.0)
WBC: 3.6 10*3/uL — ABNORMAL LOW (ref 3.8–10.8)

## 2019-10-14 LAB — TSH: TSH: 2.47 mIU/L (ref 0.40–4.50)

## 2019-10-14 LAB — LIPASE: Lipase: 12 U/L (ref 7–60)

## 2019-10-17 ENCOUNTER — Encounter: Payer: Self-pay | Admitting: Family Medicine

## 2019-10-17 ENCOUNTER — Other Ambulatory Visit: Payer: Self-pay | Admitting: *Deleted

## 2019-10-17 MED ORDER — PREDNISONE 20 MG PO TABS: 40.0000 mg | ORAL_TABLET | Freq: Every day | ORAL | 0 refills | Status: DC

## 2019-10-17 NOTE — Progress Notes (Signed)
Please inform patient of the following:  Labs are all stable. We will contact him with the results of his heart monitor once we have them.  Mason Vargas. [name], MD 10/17/2019 3:15 PM

## 2019-10-17 NOTE — Telephone Encounter (Signed)
Rx was send to pharmacy, pt notified  If no improvement schedule appointment with PCP

## 2019-10-18 ENCOUNTER — Other Ambulatory Visit (INDEPENDENT_AMBULATORY_CARE_PROVIDER_SITE_OTHER): Payer: Commercial Managed Care - PPO

## 2019-10-18 DIAGNOSIS — R002 Palpitations: Secondary | ICD-10-CM | POA: Diagnosis not present

## 2019-10-27 ENCOUNTER — Ambulatory Visit (AMBULATORY_SURGERY_CENTER): Payer: Self-pay

## 2019-10-27 ENCOUNTER — Other Ambulatory Visit: Payer: Self-pay

## 2019-10-27 VITALS — Ht 72.0 in | Wt 177.0 lb

## 2019-10-27 DIAGNOSIS — Z86010 Personal history of colonic polyps: Secondary | ICD-10-CM

## 2019-10-27 MED ORDER — NA SULFATE-K SULFATE-MG SULF 17.5-3.13-1.6 GM/177ML PO SOLN: 1.0000 | Freq: Once | ORAL | 0 refills | Status: AC

## 2019-10-27 NOTE — Progress Notes (Signed)
No egg or soy allergy known to patient  No issues with past sedation with any surgeries or procedures No intubation problems in the past  No FH of Malignant Hyperthermia No diet pills per patient No home 02 use per patient  No blood thinners per patient  Pt denies issues with constipation  No A fib or A flutter  COVID 19 guidelines implemented in PV today with Pt and RN  Coupon given to pt in PV today , Code to Pharmacy  COVID vaccines completed on 03/2019 per pt;  Due to the COVID-19 pandemic we are asking patients to follow these guidelines. Please only bring one care partner. Please be aware that your care partner may wait in the car in the parking lot or if they feel like they will be too hot to wait in the car, they may wait in the lobby on the 4th floor. All care partners are required to wear a mask the entire time (we do not have any that we can provide them), they need to practice social distancing, and we will do a Covid check for all patient's and care partners when you arrive. Also we will check their temperature and your temperature. If the care partner waits in their car they need to stay in the parking lot the entire time and we will call them on their cell phone when the patient is ready for discharge so they can bring the car to the front of the building. Also all patient's will need to wear a mask into building.  

## 2019-10-30 ENCOUNTER — Encounter: Payer: Self-pay | Admitting: Family Medicine

## 2019-10-31 ENCOUNTER — Encounter: Payer: Self-pay | Admitting: Family Medicine

## 2019-10-31 ENCOUNTER — Ambulatory Visit: Payer: Commercial Managed Care - PPO | Admitting: Family Medicine

## 2019-10-31 ENCOUNTER — Other Ambulatory Visit: Payer: Self-pay

## 2019-10-31 ENCOUNTER — Other Ambulatory Visit: Payer: Self-pay | Admitting: *Deleted

## 2019-10-31 ENCOUNTER — Encounter: Payer: Self-pay | Admitting: Gastroenterology

## 2019-10-31 DIAGNOSIS — M545 Low back pain, unspecified: Secondary | ICD-10-CM | POA: Diagnosis not present

## 2019-10-31 MED ORDER — NA SULFATE-K SULFATE-MG SULF 17.5-3.13-1.6 GM/177ML PO SOLN: 1.0000 | Freq: Once | ORAL | 0 refills | Status: AC

## 2019-10-31 MED ORDER — DICLOFENAC SODIUM 75 MG PO TBEC: 75.0000 mg | DELAYED_RELEASE_TABLET | Freq: Two times a day (BID) | ORAL | 0 refills | Status: DC

## 2019-10-31 NOTE — Assessment & Plan Note (Signed)
No red flag symptoms though as developed an acute flare.  Will start diclofenac.  He is already taking Flexeril and tramadol.  Will place referral for him to see physical therapy.

## 2019-10-31 NOTE — Patient Instructions (Signed)
It was very nice to see you today!  Please start the diclofenac for your back.  I will place a referral for you to see the physical therapist as well.  Let me know if not improving,   Take care, Dr [name]  Please try these tips to maintain a healthy lifestyle:   Eat at least 3 REAL meals and 1-2 snacks per day.  Aim for no more than 5 hours between eating.  If you eat breakfast, please do so within one hour of getting up.    Each meal should contain half fruits/vegetables, one quarter protein, and one quarter carbs (no bigger than a computer mouse)   Cut down on sweet beverages. This includes juice, soda, and sweet tea.     Drink at least 1 glass of water with each meal and aim for at least 8 glasses per day   Exercise at least 150 minutes every week.

## 2019-10-31 NOTE — Progress Notes (Signed)
   Mason Vargas is a 64 y.o. male who presents today for an office visit.  Assessment/Plan:  Chronic Problems Addressed Today: Low back pain No red flag symptoms though as developed an acute flare.  Will start diclofenac.  He is already taking Flexeril and tramadol.  Will place referral for him to see physical therapy.  Palpitations Will check on status of holter monitor.     Subjective:  HPI:  Patient had a flareup of back pain 2 weeks ago.  He was doing yard work and thinks he injured it then.  Located in lower back.  Radiates into left leg.  Has had some sciatica symptoms as well.  Tried prednisone which is helped modestly.  Symptoms have been persistent.  Is also tried tramadol which helps.  He has been taking Flexeril nightly as well.  No reported weakness.  No reported bowel or bladder incontinence.        Objective:  Physical Exam: BP 124/74   Pulse 80   Temp 98.8 F (37.1 C) (Temporal)   Ht 6' (1.829 m)   Wt 175 lb 3.2 oz (79.5 kg)   SpO2 97%   BMI 23.76 kg/m   Gen: No acute distress, resting comfortably CV: Regular rate and rhythm with no murmurs appreciated Pulm: Normal work of breathing, clear to auscultation bilaterally with no crackles, wheezes, or rhonchi MSK: Lower back without deformities.  Tender to palpation along bilateral lower lumbar paraspinal muscles.  Both lower extremities with full range of motion throughout. Neuro: Grossly normal, moves all extremities Psych: Normal affect and thought content      Mason Vargas M. [name], MD 10/31/2019 1:36 PM

## 2019-11-07 MED FILL — OMEPRAZOLE DR 20 MG CAPSULE: 20 | 90 days supply | Qty: 90 | Fill #2

## 2019-11-13 NOTE — Progress Notes (Signed)
Please inform patient of the following:  He had a few minor arrhythmias on his heart monitor. These were very rare and not life threatening but likely explain his symptoms. Recommend cardiology referral to discuss treatment if he is interested.  Mason Vargas. [name], MD 11/13/2019 10:32 AM

## 2019-11-15 ENCOUNTER — Other Ambulatory Visit: Payer: Self-pay

## 2019-11-15 ENCOUNTER — Encounter: Payer: Self-pay | Admitting: Gastroenterology

## 2019-11-15 ENCOUNTER — Ambulatory Visit (AMBULATORY_SURGERY_CENTER): Payer: Commercial Managed Care - PPO | Admitting: Gastroenterology

## 2019-11-15 VITALS — BP 128/86 | HR 96 | Temp 98.3°F | Resp 15 | Ht 72.0 in | Wt 177.0 lb

## 2019-11-15 DIAGNOSIS — D123 Benign neoplasm of transverse colon: Secondary | ICD-10-CM

## 2019-11-15 DIAGNOSIS — Z86010 Personal history of colonic polyps: Secondary | ICD-10-CM

## 2019-11-15 DIAGNOSIS — K635 Polyp of colon: Secondary | ICD-10-CM

## 2019-11-15 DIAGNOSIS — D122 Benign neoplasm of ascending colon: Secondary | ICD-10-CM

## 2019-11-15 DIAGNOSIS — K621 Rectal polyp: Secondary | ICD-10-CM | POA: Diagnosis not present

## 2019-11-15 DIAGNOSIS — D128 Benign neoplasm of rectum: Secondary | ICD-10-CM

## 2019-11-15 MED ORDER — SODIUM CHLORIDE 0.9 % IV SOLN: 500.0000 mL | Freq: Once | INTRAVENOUS | Status: DC

## 2019-11-15 NOTE — Progress Notes (Signed)
Pt's states no medical or surgical changes since previsit or office visit. 

## 2019-11-15 NOTE — Progress Notes (Signed)
Report to PACU, RN, vss, BBS= Clear.  

## 2019-11-15 NOTE — Progress Notes (Signed)
Called to room to assist during endoscopic procedure.  Patient ID and intended procedure confirmed with present staff. Received instructions for my participation in the procedure from the performing physician.  

## 2019-11-15 NOTE — Patient Instructions (Signed)
Thank you for allowing [place] to care for you today!  Recommend surveillance colonoscopy in 3-5 years depending on final pathology results. We will send a letter with results and recommendation.  Resume previous diet and medications today.   Resume normal activities tomorrow.   YOU HAD AN ENDOSCOPIC PROCEDURE TODAY AT [facility]:   Refer to the procedure report that was given to you for any specific questions about what was found during the examination.  If the procedure report does not answer your questions, please call your gastroenterologist to clarify.  If you requested that your care partner not be given the details of your procedure findings, then the procedure report has been included in a sealed envelope for you to review at your convenience later.  YOU SHOULD EXPECT: Some feelings of bloating in the abdomen. Passage of more gas than usual.  Walking can help get rid of the air that was put into your GI tract during the procedure and reduce the bloating. If you had a lower endoscopy (such as a colonoscopy or flexible sigmoidoscopy) you may notice spotting of blood in your stool or on the toilet paper. If you underwent a bowel prep for your procedure, you may not have a normal bowel movement for a few days.  Please Note:  You might notice some irritation and congestion in your nose or some drainage.  This is from the oxygen used during your procedure.  There is no need for concern and it should clear up in a day or so.  SYMPTOMS TO REPORT IMMEDIATELY:   Following lower endoscopy (colonoscopy or flexible sigmoidoscopy):  Excessive amounts of blood in the stool  Significant tenderness or worsening of abdominal pains  Swelling of the abdomen that is new, acute  Fever of 100F or higher    For urgent or emergent issues, a gastroenterologist can be reached at any hour by calling [phone]. Do not use MyChart messaging for urgent concerns.    DIET:  We do recommend a  small meal at first, but then you may proceed to your regular diet.  Drink plenty of fluids but you should avoid alcoholic beverages for 24 hours.  ACTIVITY:  You should plan to take it easy for the rest of today and you should NOT DRIVE or use heavy machinery until tomorrow (because of the sedation medicines used during the test).    FOLLOW UP: Our staff will call the number listed on your records 48-72 hours following your procedure to check on you and address any questions or concerns that you may have regarding the information given to you following your procedure. If we do not reach you, we will leave a message.  We will attempt to reach you two times.  During this call, we will ask if you have developed any symptoms of COVID 19. If you develop any symptoms (ie: fever, flu-like symptoms, shortness of breath, cough etc.) before then, please call [phone].  If you test positive for Covid 19 in the 2 weeks post procedure, please call and report this information to [place].    If any biopsies were taken you will be contacted by phone or by letter within the next 1-3 weeks.  Please call us at [phone] if you have not heard about the biopsies in 3 weeks.    SIGNATURES/CONFIDENTIALITY: You and/or your care partner have signed paperwork which will be entered into your electronic medical record.  These signatures attest to the fact that that the  information above on your After Visit Summary has been reviewed and is understood.  Full responsibility of the confidentiality of this discharge information lies with you and/or your care-partner.

## 2019-11-15 NOTE — Progress Notes (Signed)
C.W. vital signs. 

## 2019-11-15 NOTE — Progress Notes (Signed)
Called to Pre-op for pt HR in 130s.  Pt reports no chest pain.  He says he is nervous.  Recently was on Holter for palpitations but monitor showed nothing. Gave him 10mg  esmolol and HR dropped in th 110s and but remained the same.  Decision to proceed   Dr Silverio Decamp notified  Addendum: He is asymptomatic with stable blood pressure. HR slowed down to 90's transiently with Valsalva maneuver and esmolol. He remained stable intra procedure and also in the recovery. Advised patient to follow up with PMD and Cardiology Raliegh Ip Denzil Magnuson , MD [phone]

## 2019-11-15 NOTE — Progress Notes (Signed)
Patient Heart 133 on admission. States he feel his heart racing. CRNA aware. States he is nervous and his heart rate goes up. B/P 132/93. Skin warm and dry.

## 2019-11-15 NOTE — Progress Notes (Signed)
Dr Burna Mortimer aware of lidocaine

## 2019-11-15 NOTE — Op Note (Signed)
[place] Patient Name: Mason Vargas Procedure Date: 11/15/2019 2:07 PM MRN: [id number] Endoscopist: Mauri Pole , MD Age: 64 Referring MD:  Date of Birth: 14-Jul-1955 Gender: Male Account #: [id number] Procedure:                Colonoscopy Indications:              High risk colon cancer surveillance: Personal                            history of colonic polyps Medicines:                Monitored Anesthesia Care Procedure:                Pre-Anesthesia Assessment:                           - Prior to the procedure, a History and Physical                            was performed, and patient medications and                            allergies were reviewed. The patient's tolerance of                            previous anesthesia was also reviewed. The risks                            and benefits of the procedure and the sedation                            options and risks were discussed with the patient.                            All questions were answered, and informed consent                            was obtained. Prior Anticoagulants: The patient has                            taken no previous anticoagulant or antiplatelet                            agents. ASA Grade Assessment: II - A patient with                            mild systemic disease. After reviewing the risks                            and benefits, the patient was deemed in                            satisfactory condition to undergo the procedure.  After obtaining informed consent, the colonoscope                            was passed under direct vision. Throughout the                            procedure, the patient's blood pressure, pulse, and                            oxygen saturations were monitored continuously. The                            Colonoscope was introduced through the anus and                            advanced to the the cecum, identified by                             appendiceal orifice and ileocecal valve. The                            colonoscopy was performed without difficulty. The                            patient tolerated the procedure well. The quality                            of the bowel preparation was adequate. The                            ileocecal valve, appendiceal orifice, and rectum                            were photographed. Scope In: 2:33:10 PM Scope Out: 2:52:00 PM Scope Withdrawal Time: 0 hours 11 minutes 25 seconds  Total Procedure Duration: 0 hours 18 minutes 50 seconds  Findings:                 The perianal and digital rectal examinations were                            normal.                           Three sessile polyps were found in the rectum,                            transverse colon and ascending colon. The polyps                            were 1 to 2 mm in size. These polyps were removed                            with a cold biopsy forceps. Resection and retrieval  were complete.                           Scattered small-mouthed diverticula were found in                            the sigmoid colon, descending colon and ascending                            colon.                           Non-bleeding external and internal hemorrhoids were                            found during retroflexion. The hemorrhoids were                            medium-sized. Complications:            No immediate complications. Estimated Blood Loss:     Estimated blood loss was minimal. Impression:               - Three 1 to 2 mm polyps in the rectum, in the                            transverse colon and in the ascending colon,                            removed with a cold biopsy forceps. Resected and                            retrieved.                           - Diverticulosis in the sigmoid colon, in the                            descending colon and in the  ascending colon.                           - Non-bleeding external and internal hemorrhoids. Recommendation:           - Patient has a contact number available for                            emergencies. The signs and symptoms of potential                            delayed complications were discussed with the                            patient. Return to normal activities tomorrow.                            Written discharge instructions were provided to the  patient.                           - Resume previous diet.                           - Continue present medications.                           - Await pathology results.                           - Repeat colonoscopy in 3 - 5 years for                            surveillance based on pathology results. Mauri Pole, MD 11/15/2019 3:00:09 PM This report has been signed electronically.

## 2019-11-17 ENCOUNTER — Telehealth: Payer: Self-pay | Admitting: *Deleted

## 2019-11-17 ENCOUNTER — Telehealth: Payer: Self-pay

## 2019-11-17 NOTE — Telephone Encounter (Signed)
  Follow up Call-  Call back number 11/15/2019  Post procedure Call Back phone  # [phone]  Permission to leave phone message Yes  Some recent data might be hidden     Patient questions:  Do you have a fever, pain , or abdominal swelling? No. Pain Score  0 *  Have you tolerated food without any problems? Yes.    Have you been able to return to your normal activities? Yes.    Do you have any questions about your discharge instructions: Diet   No. Medications  No. Follow up visit  No.  Do you have questions or concerns about your Care? No.  Actions: * If pain score is 4 or above: 1. No action needed, pain <4.Have you developed a fever since your procedure? no  2.   Have you had an respiratory symptoms (SOB or cough) since your procedure? no  3.   Have you tested positive for COVID 19 since your procedure no  4.   Have you had any family members/close contacts diagnosed with the COVID 19 since your procedure?  no   If yes to any of these questions please route to Joylene John, RN and Joella Prince, RN

## 2019-11-17 NOTE — Telephone Encounter (Signed)
LVM

## 2019-11-21 ENCOUNTER — Other Ambulatory Visit: Payer: Self-pay | Admitting: *Deleted

## 2019-11-21 DIAGNOSIS — I498 Other specified cardiac arrhythmias: Secondary | ICD-10-CM

## 2019-11-27 ENCOUNTER — Encounter: Payer: Self-pay | Admitting: Gastroenterology

## 2019-12-11 ENCOUNTER — Encounter: Payer: Self-pay | Admitting: Internal Medicine

## 2019-12-11 ENCOUNTER — Other Ambulatory Visit: Payer: Self-pay

## 2019-12-11 ENCOUNTER — Ambulatory Visit: Payer: Commercial Managed Care - PPO | Admitting: Internal Medicine

## 2019-12-11 VITALS — BP 138/76 | HR 88 | Ht 72.0 in | Wt 180.0 lb

## 2019-12-11 DIAGNOSIS — I493 Ventricular premature depolarization: Secondary | ICD-10-CM

## 2019-12-11 NOTE — Patient Instructions (Signed)
Medication Instructions:  Your physician recommends that you continue on your current medications as directed. Please refer to the Current Medication list given to you today.  *If you need a refill on your cardiac medications before your next appointment, please call your pharmacy*   Lab Work: None   If you have labs (blood work) drawn today and your tests are completely normal, you will receive your results only by: Marland Kitchen MyChart Message (if you have MyChart) OR . A paper copy in the mail If you have any lab test that is abnormal or we need to change your treatment, we will call you to review the results.   Testing/Procedures: Your physician has requested that you have an echocardiogram. Echocardiography is a painless test that uses sound waves to create images of your heart. It provides your doctor with information about the size and shape of your heart and how well your heart's chambers and valves are working. This procedure takes approximately one hour. There are no restrictions for this procedure.  Follow-Up: At [facility], you and your health needs are our priority.  As part of our continuing mission to provide you with exceptional heart care, we have created designated Provider Care Teams.  These Care Teams include your primary Cardiologist (physician) and Advanced Practice Providers (APPs -  Physician Assistants and Nurse Practitioners) who all work together to provide you with the care you need, when you need it.  We recommend signing up for the patient portal called "MyChart".  Sign up information is provided on this After Visit Summary.  MyChart is used to connect with patients for Virtual Visits (Telemedicine).  Patients are able to view lab/test results, encounter notes, upcoming appointments, etc.  Non-urgent messages can be sent to your provider as well.   To learn more about what you can do with MyChart, go to [email or web address].    Your next appointment:   3-4  month(s)  The format for your next appointment:   In Person  Provider:   Rudean Haskell, MD   Other Instructions None

## 2019-12-11 NOTE — Progress Notes (Signed)
Cardiology Office Note:    Date:  12/11/2019   ID:  BOE DEANS, DOB [birth year], MRN [id number]  PCP:  Vivi Barrack, MD  [place] Cardiologist:  No primary care provider on file.  [facility] Electrophysiologist:  None   Referring MD: Vivi Barrack, MD   CC: Arrhythmias Consulted for the evaluation of arrhythmias at the behest of Vivi Barrack, MD   History of Present Illness:    MILFERD ANSELL is a 64 y.o. male with a hx of HLD, ED, and Family history of CAD, HTN, who presents with question of arrhythmia.   Patient notes that that he has had an increase in his short runs of SVT.  Patient had recent Ziopatch with rare PVCs and occasional PVCs.  Notes that this gets worse with caffeine.  Improved with decreasing caffeine.  Had colonoscopy recently complicated by PVCs.  No chest pain, SOB, PNC orthopnea.  No syncope.  Patient presently takes 12.5 mg metoprolol tartrate prn palpitations:  Takes an am dose with his diet Coke and does not take one in the PM.  No SCD in the family.  Past Medical History:  Diagnosis Date  . GERD (gastroesophageal reflux disease)    on meds  . Hyperlipidemia    on meds  . Hypertension    on meds  . Insomnia   . Lumbar herniated disc   . Sciatica 04/08/2018   Left leg  . Snores     Past Surgical History:  Procedure Laterality Date  . CLOSED REDUCTION NASAL FRACTURE N/A 05/26/2018   Procedure: CLOSED REDUCTION NASAL FRACTURE;  Surgeon: Rozetta Nunnery, MD;  Location: [facility];  Service: ENT;  Laterality: N/A;  . COLONOSCOPY W/ POLYPECTOMY  2014  . WISDOM TOOTH EXTRACTION      Current Medications: Current Meds  Medication Sig  . atorvastatin (LIPITOR) 20 MG tablet TAKE 1 TABLET (20 MG TOTAL) BY MOUTH AT BEDTIME.  . metoprolol tartrate (LOPRESSOR) 25 MG tablet Take 12.5 mg by mouth 2 (two) times daily as needed.  Marland Kitchen omeprazole (PRILOSEC) 20 MG capsule TAKE 1 CAPSULE (20 MG TOTAL) BY MOUTH DAILY.  . traMADol  (ULTRAM) 50 MG tablet Take 50-100 mg by mouth every 6 (six) hours as needed.  . [DISCONTINUED] METOPROLOL SUCCINATE PO Take by mouth daily.     Allergies:   Patient has no known allergies.   Social History   Socioeconomic History  . Marital status: Married    Spouse name: Not on file  . Number of children: 0  . Years of education: Not on file  . Highest education level: Some college, no degree  Occupational History  . Not on file  Tobacco Use  . Smoking status: Never Smoker  . Smokeless tobacco: Never Used  Vaping Use  . Vaping Use: Never used  Substance and Sexual Activity  . Alcohol use: Yes    Alcohol/week: 1.0 standard drink    Types: 1 Standard drinks or equivalent per week    Comment: rarely  . Drug use: No  . Sexual activity: Yes    Birth control/protection: None  Other Topics Concern  . Not on file  Social History Narrative   Right handed    Caffeine 33 -4 cups daily    Social Determinants of Health   Financial Resource Strain:   . Difficulty of Paying Living Expenses: Not on file  Food Insecurity:   . Worried About Charity fundraiser in the Last  Year: Not on file  . Ran Out of Food in the Last Year: Not on file  Transportation Needs:   . Lack of Transportation (Medical): Not on file  . Lack of Transportation (Non-Medical): Not on file  Physical Activity:   . Days of Exercise per Week: Not on file  . Minutes of Exercise per Session: Not on file  Stress:   . Feeling of Stress : Not on file  Social Connections:   . Frequency of Communication with Friends and Family: Not on file  . Frequency of Social Gatherings with Friends and Family: Not on file  . Attends Religious Services: Not on file  . Active Member of Clubs or Organizations: Not on file  . Attends Archivist Meetings: Not on file  . Marital Status: Not on file    Family History: The patient's family history includes Heart Problems in his father; Heart attack in his father;  Hyperlipidemia in his father; Hypertension in his father. There is no history of Colon cancer, Pancreatic cancer, Stomach cancer, Colon polyps, Esophageal cancer, or Rectal cancer. Father had ischemic cardiomyopathy and prior transplant  ROS:   Please see the history of present illness.    All other systems reviewed and are negative.  EKGs/Labs/Other Studies Reviewed:    The following studies were reviewed today:  EKG:   10/13/19 SR 88, No ST/T changes  11/03/19 Zio Patch Study Highlights  1. The basic rhythm is normal sinus with an average HR of 81 bpm 2. No atrial fibrillation or flutter 3. No high-grade heart block or pathologic pauses 4. There are rare PVC's and occasional supraventricular beats without sustained arrhythmias. There are rare, short supraventricular runs up to a maximum of 5 beats   Recent Labs: 10/13/2019: ALT 30; BUN 19; Creat 0.70; Hemoglobin 14.7; Platelets 179; Potassium 4.1; Sodium 138; TSH 2.47  Recent Lipid Panel    Component Value Date/Time   CHOL 107 03/21/2019 0947   TRIG 82.0 03/21/2019 0947   HDL 35.10 (L) 03/21/2019 0947   CHOLHDL 3 03/21/2019 0947   VLDL 16.4 03/21/2019 0947   LDLCALC 55 03/21/2019 0947     Risk Assessment/Calculations:      Physical Exam:    VS:  BP 138/76   Pulse 88   Ht 6' (1.829 m)   Wt 180 lb (81.6 kg)   BMI 24.41 kg/m     Wt Readings from Last 3 Encounters:  12/11/19 180 lb (81.6 kg)  11/15/19 177 lb (80.3 kg)  10/31/19 175 lb 3.2 oz (79.5 kg)     GEN: Well nourished, well developed in no acute distress HEENT: Normal NECK: No JVD; No carotid bruits LYMPHATICS: No lymphadenopathy CARDIAC: RRR, no murmurs, rubs, gallops RESPIRATORY:  Clear to auscultation without rales, wheezing or rhonchi  ABDOMEN: Soft, non-tender, non-distended MUSCULOSKELETAL:  No edema; No deformity  SKIN: Warm and dry NEUROLOGIC:  Alert and oriented x 3 PSYCHIATRIC:  Normal affect   ASSESSMENT:    1. PVC (premature  ventricular contraction)    PLAN:    In order of problems listed above:  PVCs SVT - will get echocardiogram - will continue metoprolol 12.5 mg PO BID PRN   HLD LDL < 70  3-4 month follow up unless new symptoms or abnormal test results warranting change in plan  Would be reasonable for Virtual Follow up  Would be reasonable for  APP Follow up   Shared Decision Making/Informed Consent       Medication Adjustments/Labs and  Tests Ordered: Current medicines are reviewed at length with the patient today.  Concerns regarding medicines are outlined above.  Orders Placed This Encounter  Procedures  . ECHOCARDIOGRAM COMPLETE   No orders of the defined types were placed in this encounter.   Patient Instructions  Medication Instructions:  Your physician recommends that you continue on your current medications as directed. Please refer to the Current Medication list given to you today.  *If you need a refill on your cardiac medications before your next appointment, please call your pharmacy*   Lab Work: None   If you have labs (blood work) drawn today and your tests are completely normal, you will receive your results only by: Marland Kitchen MyChart Message (if you have MyChart) OR . A paper copy in the mail If you have any lab test that is abnormal or we need to change your treatment, we will call you to review the results.   Testing/Procedures: Your physician has requested that you have an echocardiogram. Echocardiography is a painless test that uses sound waves to create images of your heart. It provides your doctor with information about the size and shape of your heart and how well your heart's chambers and valves are working. This procedure takes approximately one hour. There are no restrictions for this procedure.  Follow-Up: At [facility], you and your health needs are our priority.  As part of our continuing mission to provide you with exceptional heart care, we have created  designated Provider Care Teams.  These Care Teams include your primary Cardiologist (physician) and Advanced Practice Providers (APPs -  Physician Assistants and Nurse Practitioners) who all work together to provide you with the care you need, when you need it.  We recommend signing up for the patient portal called "MyChart".  Sign up information is provided on this After Visit Summary.  MyChart is used to connect with patients for Virtual Visits (Telemedicine).  Patients are able to view lab/test results, encounter notes, upcoming appointments, etc.  Non-urgent messages can be sent to your provider as well.   To learn more about what you can do with MyChart, go to [email or web address].    Your next appointment:   3-4 month(s)  The format for your next appointment:   In Person  Provider:   Rudean Haskell, MD   Other Instructions None     Signed, Werner Lean, MD  12/11/2019 4:59 PM    [place]

## 2020-01-03 ENCOUNTER — Ambulatory Visit (HOSPITAL_COMMUNITY): Payer: Commercial Managed Care - PPO | Attending: Cardiology

## 2020-01-03 ENCOUNTER — Other Ambulatory Visit: Payer: Self-pay

## 2020-01-03 DIAGNOSIS — I493 Ventricular premature depolarization: Secondary | ICD-10-CM | POA: Diagnosis present

## 2020-01-03 LAB — ECHOCARDIOGRAM COMPLETE
Area-P 1/2: 3.77 cm2
S' Lateral: 2.9 cm

## 2020-01-04 ENCOUNTER — Telehealth: Payer: Self-pay | Admitting: *Deleted

## 2020-01-04 DIAGNOSIS — I77819 Aortic ectasia, unspecified site: Secondary | ICD-10-CM

## 2020-01-04 NOTE — Telephone Encounter (Signed)
The patient has been notified of the result and verbalized understanding.  All questions (if any) were answered. Darrell Jewel, RN 01/04/2020 1:09 PM   Order for echo in 6 months. Patient agreeable and aware

## 2020-01-04 NOTE — Telephone Encounter (Signed)
-----   Message from Werner Lean, MD sent at 01/04/2020 11:59 AM EST ----- Results: Normal LVEF, Mild AA Enlargement Plan: Echo in 6 months  Werner Lean, MD

## 2020-01-08 ENCOUNTER — Other Ambulatory Visit: Payer: Self-pay | Admitting: Neurology

## 2020-01-08 ENCOUNTER — Other Ambulatory Visit: Payer: Self-pay | Admitting: Family Medicine

## 2020-01-08 MED FILL — METOPROLOL TARTRATE 25 MG T: 25 | 30 days supply | Qty: 60 | Fill #0

## 2020-01-08 MED FILL — ATORVASTATIN CALCIUM 20 MG: 20 | 90 days supply | Qty: 90 | Fill #2

## 2020-02-12 MED FILL — OMEPRAZOLE DR 20 MG CAPSULE: 20 | 90 days supply | Qty: 90 | Fill #3

## 2020-03-04 ENCOUNTER — Other Ambulatory Visit: Payer: Self-pay | Admitting: Family Medicine

## 2020-03-04 MED FILL — METOPROLOL TARTRATE 25 MG T: 25 | 30 days supply | Qty: 60 | Fill #0

## 2020-03-05 ENCOUNTER — Other Ambulatory Visit: Payer: Self-pay | Admitting: Physician Assistant

## 2020-03-05 ENCOUNTER — Ambulatory Visit (INDEPENDENT_AMBULATORY_CARE_PROVIDER_SITE_OTHER): Payer: Commercial Managed Care - PPO

## 2020-03-05 ENCOUNTER — Telehealth: Payer: Self-pay

## 2020-03-05 ENCOUNTER — Ambulatory Visit: Payer: Commercial Managed Care - PPO | Admitting: Physician Assistant

## 2020-03-05 ENCOUNTER — Other Ambulatory Visit: Payer: Self-pay

## 2020-03-05 ENCOUNTER — Encounter: Payer: Self-pay | Admitting: Physician Assistant

## 2020-03-05 VITALS — BP 150/90 | HR 76 | Temp 98.6°F | Ht 72.0 in | Wt 180.5 lb

## 2020-03-05 DIAGNOSIS — M545 Low back pain, unspecified: Secondary | ICD-10-CM

## 2020-03-05 MED ORDER — CYCLOBENZAPRINE HCL 10 MG PO TABS: 10.0000 mg | ORAL_TABLET | Freq: Three times a day (TID) | ORAL | 1 refills | Status: DC | PRN

## 2020-03-05 MED ORDER — TRAMADOL HCL 50 MG PO TABS: 50.0000 mg | ORAL_TABLET | Freq: Three times a day (TID) | ORAL | 0 refills | Status: DC | PRN

## 2020-03-05 MED ORDER — DICLOFENAC SODIUM 75 MG PO TBEC: 75.0000 mg | DELAYED_RELEASE_TABLET | Freq: Two times a day (BID) | ORAL | 0 refills | Status: DC

## 2020-03-05 MED FILL — traMADol HCL 50 MG TABS: 50 | 5 days supply | Qty: 15 | Fill #0

## 2020-03-05 MED FILL — CYCLOBENZAPRINE HCL 10 MG T: 10 | 10 days supply | Qty: 30 | Fill #0

## 2020-03-05 MED FILL — DICLOFENAC SOD EC 75 MG TAB: 75 | 15 days supply | Qty: 30 | Fill #0

## 2020-03-05 NOTE — Telephone Encounter (Signed)
Water aerobics is a great form of low impact exercise.  [name]. [name], MD 03/05/2020 11:27 AM

## 2020-03-05 NOTE — Telephone Encounter (Signed)
Please see message. °

## 2020-03-05 NOTE — Patient Instructions (Signed)
It was great to see you!  Xray today.  Start diclofenac and use twice a day. This will replace any ibuprofen products you are taking.  Tramadol as needed for severe pain.  Flexeril as needed for muscle spasm.  If you have any concerns with new neurological issues (worsening numbness or any new weakness) please call Dr. Arnoldo Morale or go to the ER.  Take care,  Inda Coke PA-C

## 2020-03-05 NOTE — Progress Notes (Signed)
Mason Vargas is a 65 y.o. male here for a follow up of a pre-existing problem.  I acted as a Education administrator for [employer], PA-C Mason Pickler, LPN   History of Present Illness:   Chief Complaint  Patient presents with  . Back Pain    HPI   Back pain Pt c/o low pain back started yesterday after getting out of car. Pt says it radiates into buttocks left > right. Has long hx of back problems, required back surgery with Dr. Arnoldo Vargas. He was in bed yesterday and took muscle relaxer and Aleve for pain and used heating pad.  No incontinence of stool or bladder, saddle anesthesia. He went to PT in October after last back strain. Didn't feel comfortable with this treatment so he did not continue this.  He has also been given prednisone in the past for his back pain but he does not like the way that it makes him feel, states it makes him feel very jittery.  He has baseline numbness to his anterior lateral left knee from prior back issues.  He denies any new numbness or tingling with this current episode of back pain.  Denies any weakness.   Past Medical History:  Diagnosis Date  . GERD (gastroesophageal reflux disease)    on meds  . Hyperlipidemia    on meds  . Hypertension    on meds  . Insomnia   . Lumbar herniated disc   . Sciatica 04/08/2018   Left leg  . Snores      Social History   Tobacco Use  . Smoking status: Never Smoker  . Smokeless tobacco: Never Used  Vaping Use  . Vaping Use: Never used  Substance Use Topics  . Alcohol use: Yes    Alcohol/week: 1.0 standard drink    Types: 1 Standard drinks or equivalent per week    Comment: rarely  . Drug use: No    Past Surgical History:  Procedure Laterality Date  . CLOSED REDUCTION NASAL FRACTURE N/A 05/26/2018   Procedure: CLOSED REDUCTION NASAL FRACTURE;  Surgeon: Mason Nunnery, MD;  Location: [facility];  Service: ENT;  Laterality: N/A;  . COLONOSCOPY W/ POLYPECTOMY  2014  . WISDOM TOOTH EXTRACTION       Family History  Problem Relation Age of Onset  . Heart attack Father   . Heart Problems Father   . Hypertension Father   . Hyperlipidemia Father   . Colon cancer Neg Hx   . Pancreatic cancer Neg Hx   . Stomach cancer Neg Hx   . Colon polyps Neg Hx   . Esophageal cancer Neg Hx   . Rectal cancer Neg Hx     No Known Allergies  Current Medications:   Current Outpatient Medications:  .  atorvastatin (LIPITOR) 20 MG tablet, TAKE 1 TABLET (20 MG TOTAL) BY MOUTH AT BEDTIME., Disp: 90 tablet, Rfl: 3 .  diclofenac (VOLTAREN) 75 MG EC tablet, Take 1 tablet (75 mg total) by mouth 2 (two) times daily., Disp: 30 tablet, Rfl: 0 .  metoprolol tartrate (LOPRESSOR) 25 MG tablet, TAKE 1 TABLET (25 MG TOTAL) BY MOUTH TWICE A DAY, Disp: 60 tablet, Rfl: 0 .  omeprazole (PRILOSEC) 20 MG capsule, TAKE 1 CAPSULE (20 MG TOTAL) BY MOUTH DAILY., Disp: 90 capsule, Rfl: 3 .  traMADol (ULTRAM) 50 MG tablet, Take 1 tablet (50 mg total) by mouth every 8 (eight) hours as needed for up to 5 days., Disp: 15 tablet, Rfl: 0 .  cyclobenzaprine (FLEXERIL) 10 MG tablet, Take 1 tablet (10 mg total) by mouth 3 (three) times daily as needed for muscle spasms., Disp: 30 tablet, Rfl: 1   Review of Systems:   ROS Negative unless otherwise specified per HPI.  Vitals:   Vitals:   03/05/20 0940  BP: (!) 150/90  Pulse: 76  Temp: 98.6 F (37 C)  TempSrc: Temporal  SpO2: 97%  Weight: 180 lb 8 oz (81.9 kg)  Height: 6' (1.829 m)     Body mass index is 24.48 kg/m.  Physical Exam:   Physical Exam Vitals and nursing note reviewed.  Constitutional:      General: He is not in acute distress.    Appearance: He is well-developed. He is not ill-appearing, toxic-appearing or sickly-appearing.  Cardiovascular:     Rate and Rhythm: Normal rate and regular rhythm.     Pulses: Normal pulses.     Heart sounds: Normal heart sounds, S1 normal and S2 normal.     Comments: No LE edema Pulmonary:     Effort: Pulmonary  effort is normal.     Breath sounds: Normal breath sounds.  Musculoskeletal:     Comments: Decreased ROM 2/2 pain with flexion/extension, lateral side bend. Reproducible tenderness with deep palpation to bilateral paraspinal muscles. Moderate lower lumbar spinal tenderness. No evidence of erythema, rash or ecchymosis.    Skin:    General: Skin is warm, dry and intact.  Neurological:     Mental Status: He is alert.     GCS: GCS eye subscore is 4. GCS verbal subscore is 5. GCS motor subscore is 6.  Psychiatric:        Mood and Affect: Mood and affect normal.        Speech: Speech normal.        Behavior: Behavior normal. Behavior is cooperative.     Assessment and Plan:   [name] was seen today for back pain.  Diagnoses and all orders for this visit:  Low back pain, unspecified back pain laterality, unspecified chronicity, unspecified whether sciatica present Suspect muscle strain however due to bony tenderness I am also getting lumbar x-ray for further evaluation. No new neurological symptoms compared to his baseline. I offered a Toradol injection but he declined.  I also offered prednisone but he does not like this due to side effects. We are going to continue Flexeril as needed. I have given him diclofenac 75 mg twice daily to consistently use for anti-inflammation. Also may use tramadol as needed for breakthrough pain. I discussed with him that if he has any new neurological symptoms or any changes he needs to be evaluated in the ER or call Dr. Arnoldo Vargas office. -     DG Lumbar Spine Complete; Future  Other orders -     traMADol (ULTRAM) 50 MG tablet; Take 1 tablet (50 mg total) by mouth every 8 (eight) hours as needed for up to 5 days. -     diclofenac (VOLTAREN) 75 MG EC tablet; Take 1 tablet (75 mg total) by mouth 2 (two) times daily. -     cyclobenzaprine (FLEXERIL) 10 MG tablet; Take 1 tablet (10 mg total) by mouth 3 (three) times daily as needed for muscle spasms.   CMA or  LPN served as scribe during this visit. History, Physical, and Plan performed by medical provider. The above documentation has been reviewed and is accurate and complete.   Mason Coke, PA-C

## 2020-03-05 NOTE — Telephone Encounter (Signed)
Patient sent a message through the appointment request that reads:   Good morning Dr. [name] -I meant to ask the PA [facility]) this morning regarding thoughts on water therapy to help with my back since that is low impact.  I'm guessing I'm going to have issues for the rest of my days so I'm looking for alternatives to high impact workouts.

## 2020-03-06 NOTE — Telephone Encounter (Signed)
Patient notified, verbalized understanding

## 2020-03-11 ENCOUNTER — Ambulatory Visit: Payer: Commercial Managed Care - PPO | Admitting: Internal Medicine

## 2020-03-11 ENCOUNTER — Encounter: Payer: Self-pay | Admitting: Internal Medicine

## 2020-03-11 ENCOUNTER — Other Ambulatory Visit: Payer: Self-pay

## 2020-03-11 VITALS — BP 150/98 | HR 62 | Ht 72.0 in | Wt 180.0 lb

## 2020-03-11 DIAGNOSIS — I712 Thoracic aortic aneurysm, without rupture, unspecified: Secondary | ICD-10-CM | POA: Insufficient documentation

## 2020-03-11 DIAGNOSIS — I471 Supraventricular tachycardia, unspecified: Secondary | ICD-10-CM | POA: Insufficient documentation

## 2020-03-11 DIAGNOSIS — I10 Essential (primary) hypertension: Secondary | ICD-10-CM

## 2020-03-11 DIAGNOSIS — E785 Hyperlipidemia, unspecified: Secondary | ICD-10-CM | POA: Diagnosis not present

## 2020-03-11 NOTE — Patient Instructions (Signed)
Medication Instructions:  Your physician recommends that you continue on your current medications as directed. Please refer to the Current Medication list given to you today.  *If you need a refill on your cardiac medications before your next appointment, please call your pharmacy*   Lab Work: IN 5 1/2 months : BMET and lipid panel If you have labs (blood work) drawn today and your tests are completely normal, you will receive your results only by: Marland Kitchen MyChart Message (if you have MyChart) OR . A paper copy in the mail If you have any lab test that is abnormal or we need to change your treatment, we will call you to review the results.   Testing/Procedures: Your physician has requested that you have a CT of your Aorta in 5 1/2 months.    Follow-Up: At [facility], you and your health needs are our priority.  As part of our continuing mission to provide you with exceptional heart care, we have created designated Provider Care Teams.  These Care Teams include your primary Cardiologist (physician) and Advanced Practice Providers (APPs -  Physician Assistants and Nurse Practitioners) who all work together to provide you with the care you need, when you need it.  We recommend signing up for the patient portal called "MyChart".  Sign up information is provided on this After Visit Summary.  MyChart is used to connect with patients for Virtual Visits (Telemedicine).  Patients are able to view lab/test results, encounter notes, upcoming appointments, etc.  Non-urgent messages can be sent to your provider as well.   To learn more about what you can do with MyChart, go to [email or web address].    Your next appointment:   6 month(s)  The format for your next appointment:   In Person  Provider:   You may see Rudean Haskell, MD or one of the following Advanced Practice Providers on your designated Care Team:    Melina Copa, PA-C  Ermalinda Barrios, PA-C

## 2020-03-11 NOTE — Progress Notes (Signed)
Cardiology Office Note:    Date:  03/11/2020   ID:  Mason Vargas, DOB 13-Apr-1955, MRN [id number]  PCP:  Vivi Barrack, MD  [facility] Cardiologist:  Rudean Haskell MD [place] Electrophysiologist:  None   Referring MD: Vivi Barrack, MD   CC: Follow up for AA dilation  History of Present Illness:    Mason Vargas is a 65 y.o. male with a hx of HLD, ED, and Family history of CAD, HTN, who presents with question of arrhythmia 12/11/19.  In interim of this visit, patient had echo showing mild dilation of the ascending aorta- seen 03/11/20.  Patient notes that he is doing well.  Since last visit notes the opening of the [facility] (works for [employer]).  Relevant interval testing or therapy include echocardiogram.  There are no interval hospital/ED visit.    No chest pain or pressure.  No SOB/DOE and no PND/Orthopnea.  No weight gain or leg swelling.  Notes occasional palpitations which correlates with caffeine use.  Ambulatory blood pressure 130/80.   Past Medical History:  Diagnosis Date  . GERD (gastroesophageal reflux disease)    on meds  . Hyperlipidemia    on meds  . Hypertension    on meds  . Insomnia   . Lumbar herniated disc   . Sciatica 04/08/2018   Left leg  . Snores     Past Surgical History:  Procedure Laterality Date  . CLOSED REDUCTION NASAL FRACTURE N/A 05/26/2018   Procedure: CLOSED REDUCTION NASAL FRACTURE;  Surgeon: Rozetta Nunnery, MD;  Location: [facility];  Service: ENT;  Laterality: N/A;  . COLONOSCOPY W/ POLYPECTOMY  2014  . WISDOM TOOTH EXTRACTION      Current Medications: Current Meds  Medication Sig  . atorvastatin (LIPITOR) 20 MG tablet TAKE 1 TABLET (20 MG TOTAL) BY MOUTH AT BEDTIME.  . cyclobenzaprine (FLEXERIL) 10 MG tablet Take 1 tablet (10 mg total) by mouth 3 (three) times daily as needed for muscle spasms.  . diclofenac (VOLTAREN) 75 MG EC tablet Take 1 tablet (75 mg total) by mouth 2 (two)  times daily. (Patient taking differently: Take 75 mg by mouth as needed.)  . metoprolol tartrate (LOPRESSOR) 25 MG tablet TAKE 1 TABLET (25 MG TOTAL) BY MOUTH TWICE A DAY (Patient taking differently: daily.)  . omeprazole (PRILOSEC) 20 MG capsule TAKE 1 CAPSULE (20 MG TOTAL) BY MOUTH DAILY.     Allergies:   Patient has no known allergies.   Social History   Socioeconomic History  . Marital status: Married    Spouse name: Not on file  . Number of children: 0  . Years of education: Not on file  . Highest education level: Some college, no degree  Occupational History  . Not on file  Tobacco Use  . Smoking status: Never Smoker  . Smokeless tobacco: Never Used  Vaping Use  . Vaping Use: Never used  Substance and Sexual Activity  . Alcohol use: Yes    Alcohol/week: 1.0 standard drink    Types: 1 Standard drinks or equivalent per week    Comment: rarely  . Drug use: No  . Sexual activity: Yes    Birth control/protection: None  Other Topics Concern  . Not on file  Social History Narrative   Right handed    Caffeine 33 -4 cups daily    Social Determinants of Health   Financial Resource Strain: Not on file  Food Insecurity: Not on file  Transportation Needs: Not on file  Physical Activity: Not on file  Stress: Not on file  Social Connections: Not on file    Family History: The patient's family history includes Heart Problems in his father; Heart attack in his father; Hyperlipidemia in his father; Hypertension in his father. There is no history of Colon cancer, Pancreatic cancer, Stomach cancer, Colon polyps, Esophageal cancer, or Rectal cancer. Father had ischemic cardiomyopathy and prior transplant Denies family history of sudden cardiac death including drowning, car accidents, or unexplained deaths in the family. No history of bicuspid aortic valve or aortic aneurysm or dissection.  ROS:   Please see the history of present illness.    All other systems reviewed and are  negative.  EKGs/Labs/Other Studies Reviewed:    The following studies were reviewed today:  EKG:   10/13/19 SR 88, No ST/T changes  Cardiac Event Monitor Date: 11/03/19 Study Highlights  1. The basic rhythm is normal sinus with an average HR of 81 bpm 2. No atrial fibrillation or flutter 3. No high-grade heart block or pathologic pauses 4. There are rare PVC's and occasional supraventricular beats without sustained arrhythmias. There are rare, short supraventricular runs up to a maximum of 5 beats    Transthoracic Echocardiogram: Date: 01/03/2020 Results: 1. Left ventricular ejection fraction, by estimation, is 60 to 65%. The  left ventricle has normal function. The left ventricle has no regional  wall motion abnormalities. There is mild concentric left ventricular  hypertrophy. Left ventricular diastolic  parameters are consistent with Grade I diastolic dysfunction (impaired  relaxation).  2. Right ventricular systolic function is normal. The right ventricular  size is normal.  3. The mitral valve is grossly normal. Mild mitral valve regurgitation.  4. The aortic valve is tricuspid. There is mild calcification of the  aortic valve. There is mild thickening of the aortic valve. Aortic valve  regurgitation is not visualized.  5. Aortic dilatation noted. There is mild dilatation of the aortic root,  measuring 41 mm. There is mild dilatation of the ascending aorta,  measuring 39 mm.  6. The inferior vena cava is normal in size with greater than 50%  respiratory variability, suggesting right atrial pressure of 3 mmHg.    Recent Labs: 10/13/2019: ALT 30; BUN 19; Creat 0.70; Hemoglobin 14.7; Platelets 179; Potassium 4.1; Sodium 138; TSH 2.47  Recent Lipid Panel    Component Value Date/Time   CHOL 107 03/21/2019 0947   TRIG 82.0 03/21/2019 0947   HDL 35.10 (L) 03/21/2019 0947   CHOLHDL 3 03/21/2019 0947   VLDL 16.4 03/21/2019 0947   LDLCALC 55 03/21/2019 0947     Risk Assessment/Calculations:    N/A   Physical Exam:    VS:  BP (!) 150/98   Pulse 62   Ht 6' (1.829 m)   Wt 180 lb (81.6 kg)   SpO2 98%   BMI 24.41 kg/m     Wt Readings from Last 3 Encounters:  03/11/20 180 lb (81.6 kg)  03/05/20 180 lb 8 oz (81.9 kg)  12/11/19 180 lb (81.6 kg)    GEN: Well nourished, well developed in no acute distress HEENT: Normal NECK: No JVD; No carotid bruits LYMPHATICS: No lymphadenopathy CARDIAC: RRR, no murmurs, rubs, gallops RESPIRATORY:  Clear to auscultation without rales, wheezing or rhonchi  ABDOMEN: Soft, non-tender, non-distended MUSCULOSKELETAL:  No edema; No deformity  SKIN: Warm and dry NEUROLOGIC:  Alert and oriented x 3 PSYCHIATRIC:  Normal affect   ASSESSMENT:    1.  Primary hypertension   2. Thoracic aortic aneurysm without rupture ([place])   3. Hyperlipidemia, unspecified hyperlipidemia type   4. SVT (supraventricular tachycardia) (HCC)    PLAN:    In order of problems listed above:  Asymptomatic thoracic aortic aneurysm HTN - Last at 41 root; and 39 mm ascending, rate of growth unclear, indexed diameter 2.24 cm/m - Last scan 12/21 - will do CT Aorta Protocol in 6 months to confirm size  - Primary Prevention:  Patient to start BP monitoring in earnest - 1st degree relative one time screening will discuss more at next visit - Willl discussed not using Fluoroquinolones at next visit - no gradient evidence of Coarctation  PVCs SVT - will continue metoprolol 12.5 mg PO BID PRN; discussed transition to different BB if BP elevates  HLD - LDL < 70 - will recheck when we do BMP for CT scan  Six months and after CT scan follow up unless new symptoms or abnormal test results warranting change in plan  Would be reasonable for  APP Follow up   Shared Decision Making/Informed Consent       Medication Adjustments/Labs and Tests Ordered: Current medicines are reviewed at length with the patient today.  Concerns  regarding medicines are outlined above.  Orders Placed This Encounter  Procedures  . CT ANGIO CHEST AORTA W/CM & OR WO/CM  . Basic metabolic panel  . Lipid panel   No orders of the defined types were placed in this encounter.   Patient Instructions  Medication Instructions:  Your physician recommends that you continue on your current medications as directed. Please refer to the Current Medication list given to you today.  *If you need a refill on your cardiac medications before your next appointment, please call your pharmacy*   Lab Work: IN 5 1/2 months : BMET and lipid panel If you have labs (blood work) drawn today and your tests are completely normal, you will receive your results only by: Marland Kitchen MyChart Message (if you have MyChart) OR . A paper copy in the mail If you have any lab test that is abnormal or we need to change your treatment, we will call you to review the results.   Testing/Procedures: Your physician has requested that you have a CT of your Aorta in 5 1/2 months.    Follow-Up: At [facility], you and your health needs are our priority.  As part of our continuing mission to provide you with exceptional heart care, we have created designated Provider Care Teams.  These Care Teams include your primary Cardiologist (physician) and Advanced Practice Providers (APPs -  Physician Assistants and Nurse Practitioners) who all work together to provide you with the care you need, when you need it.  We recommend signing up for the patient portal called "MyChart".  Sign up information is provided on this After Visit Summary.  MyChart is used to connect with patients for Virtual Visits (Telemedicine).  Patients are able to view lab/test results, encounter notes, upcoming appointments, etc.  Non-urgent messages can be sent to your provider as well.   To learn more about what you can do with MyChart, go to [email or web address].    Your next appointment:   6 month(s)  The  format for your next appointment:   In Person  Provider:   You may see Rudean Haskell, MD or one of the following Advanced Practice Providers on your designated Care Team:    Melina Copa, PA-C  Ermalinda Barrios, PA-C  Signed, Werner Lean, MD  03/11/2020 11:02 AM    [place]

## 2020-03-29 ENCOUNTER — Other Ambulatory Visit: Payer: Commercial Managed Care - PPO

## 2020-04-03 ENCOUNTER — Other Ambulatory Visit: Payer: Self-pay | Admitting: Student

## 2020-04-03 DIAGNOSIS — M5416 Radiculopathy, lumbar region: Secondary | ICD-10-CM

## 2020-04-05 ENCOUNTER — Other Ambulatory Visit: Payer: Self-pay | Admitting: Physician Assistant

## 2020-04-05 ENCOUNTER — Other Ambulatory Visit: Payer: Self-pay | Admitting: Family Medicine

## 2020-04-05 MED FILL — DICLOFENAC SOD EC 75 MG TAB: 75 | 15 days supply | Qty: 30 | Fill #0

## 2020-04-15 ENCOUNTER — Other Ambulatory Visit: Payer: Self-pay | Admitting: Family Medicine

## 2020-04-15 MED FILL — Atorvastatin Calcium Tab 20 MG (Base Equivalent): ORAL | 90 days supply | Qty: 90 | Fill #0 | Status: CN

## 2020-04-16 ENCOUNTER — Other Ambulatory Visit (HOSPITAL_COMMUNITY): Payer: Self-pay

## 2020-04-16 MED ORDER — METOPROLOL TARTRATE 25 MG PO TABS
25.0000 mg | ORAL_TABLET | Freq: Two times a day (BID) | ORAL | 0 refills | Status: DC
  Filled 2020-04-16: qty 60, 30d supply, fill #0

## 2020-04-22 ENCOUNTER — Other Ambulatory Visit: Payer: Self-pay

## 2020-04-22 ENCOUNTER — Encounter: Payer: Self-pay | Admitting: Family Medicine

## 2020-04-22 ENCOUNTER — Ambulatory Visit (INDEPENDENT_AMBULATORY_CARE_PROVIDER_SITE_OTHER): Payer: Commercial Managed Care - PPO | Admitting: Family Medicine

## 2020-04-22 VITALS — BP 136/82 | HR 68 | Temp 98.5°F | Ht 72.0 in | Wt 182.8 lb

## 2020-04-22 DIAGNOSIS — Z125 Encounter for screening for malignant neoplasm of prostate: Secondary | ICD-10-CM

## 2020-04-22 DIAGNOSIS — E785 Hyperlipidemia, unspecified: Secondary | ICD-10-CM

## 2020-04-22 DIAGNOSIS — I471 Supraventricular tachycardia: Secondary | ICD-10-CM

## 2020-04-22 DIAGNOSIS — I712 Thoracic aortic aneurysm, without rupture, unspecified: Secondary | ICD-10-CM

## 2020-04-22 DIAGNOSIS — Z0001 Encounter for general adult medical examination with abnormal findings: Secondary | ICD-10-CM | POA: Diagnosis not present

## 2020-04-22 DIAGNOSIS — N529 Male erectile dysfunction, unspecified: Secondary | ICD-10-CM

## 2020-04-22 DIAGNOSIS — I10 Essential (primary) hypertension: Secondary | ICD-10-CM

## 2020-04-22 DIAGNOSIS — G4700 Insomnia, unspecified: Secondary | ICD-10-CM

## 2020-04-22 DIAGNOSIS — E559 Vitamin D deficiency, unspecified: Secondary | ICD-10-CM

## 2020-04-22 DIAGNOSIS — R739 Hyperglycemia, unspecified: Secondary | ICD-10-CM

## 2020-04-22 DIAGNOSIS — N400 Enlarged prostate without lower urinary tract symptoms: Secondary | ICD-10-CM

## 2020-04-22 DIAGNOSIS — M5416 Radiculopathy, lumbar region: Secondary | ICD-10-CM

## 2020-04-22 MED ORDER — TADALAFIL 5 MG PO TABS: 5.0000 mg | ORAL_TABLET | Freq: Every day | ORAL | 11 refills | Status: DC

## 2020-04-22 MED ORDER — OMEPRAZOLE 20 MG PO CPDR: DELAYED_RELEASE_CAPSULE | Freq: Every day | ORAL | 3 refills | Status: DC

## 2020-04-22 MED ORDER — METOPROLOL TARTRATE 25 MG PO TABS: 25.0000 mg | ORAL_TABLET | Freq: Two times a day (BID) | ORAL | 11 refills | Status: DC

## 2020-04-22 MED ORDER — ATORVASTATIN CALCIUM 20 MG PO TABS: ORAL_TABLET | Freq: Every day | ORAL | 3 refills | Status: DC

## 2020-04-22 MED ORDER — TRAZODONE HCL 50 MG PO TABS: 25.0000 mg | ORAL_TABLET | Freq: Every evening | ORAL | 3 refills | Status: DC | PRN

## 2020-04-22 NOTE — Assessment & Plan Note (Signed)
With concurrent ED. Recommended daily cialis. refill sent in.

## 2020-04-22 NOTE — Assessment & Plan Note (Signed)
He is having some concurrent BPH.  Advised him to take Cialis daily.  He will check with me in a few weeks let me know how this is working for him.

## 2020-04-22 NOTE — Progress Notes (Signed)
Chief Complaint:  Mason Vargas is a 65 y.o. male who presents today for his annual comprehensive physical exam.    Assessment/Plan:  Chronic Problems Addressed Today: Insomnia Longstanding problem.  Has tried conservative measures.  Was also tried Benadryl and melatonin with no improvement.  Discussed treatment options.  Has been on Lunesta in the distant past which worked reasonably well.  We will start trazodone 50 mg nightly.  May consider trial of gabapentin given his concurrent radicular symptoms if no improvement with trazodone.  Hydroxyzine would be another option.  He will check in with me in a few weeks let me know how trazodone is working for him.  Radiculopathy, lumbar region Following with neurosurgery.  Handicap placard form filled out today.  SVT (supraventricular tachycardia) ([place]) Follows with cardiology.  On metoprolol 25 mg twice daily.  Thoracic aortic aneurysm without rupture [facility]) Follows with cardiology.  Will be getting repeat scans soon.  Hyperlipidemia Continue Lipitor 2 mg daily.  Check labs today.  Vitamin D deficiency Check vitamin D.  Hypertension At goal.  Continue losartan-HCTZ 50-12.5 half tablet daily.  Check labs.   ED (erectile dysfunction) He is having some concurrent BPH.  Advised him to take Cialis daily.  He will check with me in a few weeks let me know how this is working for him.  BPH (benign prostatic hyperplasia) With concurrent ED. Recommended daily cialis. refill sent in.    Preventative Healthcare: Check labs. UTD on vaccines.   Patient Counseling(The following topics were reviewed and/or handout was given):  -Nutrition: Stressed importance of moderation in sodium/caffeine intake, saturated fat and cholesterol, caloric balance, sufficient intake of fresh fruits, vegetables, and fiber.  -Stressed the importance of regular exercise.   -Substance Abuse: Discussed cessation/primary prevention of tobacco, alcohol, or other drug use;  driving or other dangerous activities under the influence; availability of treatment for abuse.   -Injury prevention: Discussed safety belts, safety helmets, smoke detector, smoking near bedding or upholstery.   -Sexuality: Discussed sexually transmitted diseases, partner selection, use of condoms, avoidance of unintended pregnancy and contraceptive alternatives.   -Dental health: Discussed importance of regular tooth brushing, flossing, and dental visits.  -Health maintenance and immunizations reviewed. Please refer to Health maintenance section.  Return to care in 1 year for next preventative visit.     Subjective:  HPI:  He has no acute complaints today. See a/p for status of chronic conditions.  Lifestyle Diet: Balanced.  Exercise: Limited.   Depression screen PHQ 2/9 10/31/2019  Decreased Interest 0  Down, Depressed, Hopeless 0  PHQ - 2 Score 0  Altered sleeping 0  Tired, decreased energy 0  Change in appetite 0  Feeling bad or failure about yourself  0  Trouble concentrating 0  Moving slowly or fidgety/restless 0  Suicidal thoughts 0  PHQ-9 Score 0  Difficult doing work/chores Not difficult at all    Health Maintenance Due  Topic Date Due  . Hepatitis C Screening  Never done  . HIV Screening  Never done     ROS: Per HPI, otherwise a complete review of systems was negative.   PMH:  The following were reviewed and entered/updated in epic: Past Medical History:  Diagnosis Date  . GERD (gastroesophageal reflux disease)    on meds  . Hyperlipidemia    on meds  . Hypertension    on meds  . Insomnia   . Lumbar herniated disc   . Sciatica 04/08/2018   Left leg  .  Snores    Patient Active Problem List   Diagnosis Date Noted  . Insomnia 04/22/2020  . Thoracic aortic aneurysm without rupture ([place]) 03/11/2020  . SVT (supraventricular tachycardia) ([place]) 03/11/2020  . Low back pain 10/31/2019  . Abnormal chest x-ray 02/24/2019  . Radiculopathy, lumbar region  09/23/2018  . Atrophy of quadriceps femoris muscle 09/13/2018  . GERD (gastroesophageal reflux disease) 03/22/2018  . Hypertension 03/22/2018  . Lumbar herniated disc 03/22/2014  . BPH (benign prostatic hyperplasia) 03/03/2012  . Family history of ischemic heart disease (IHD) 08/10/2011  . Colon adenoma 06/11/2011  . Vitamin D deficiency 06/11/2011  . ED (erectile dysfunction) 06/02/2011  . Hyperlipidemia 03/22/2002   Past Surgical History:  Procedure Laterality Date  . CLOSED REDUCTION NASAL FRACTURE N/A 05/26/2018   Procedure: CLOSED REDUCTION NASAL FRACTURE;  Surgeon: Rozetta Nunnery, MD;  Location: [facility];  Service: ENT;  Laterality: N/A;  . COLONOSCOPY W/ POLYPECTOMY  2014  . LUMBAR DISC SURGERY    . WISDOM TOOTH EXTRACTION      Family History  Problem Relation Age of Onset  . Heart attack Father   . Heart Problems Father   . Hypertension Father   . Hyperlipidemia Father   . Colon cancer Neg Hx   . Pancreatic cancer Neg Hx   . Stomach cancer Neg Hx   . Colon polyps Neg Hx   . Esophageal cancer Neg Hx   . Rectal cancer Neg Hx     Medications- reviewed and updated Current Outpatient Medications  Medication Sig Dispense Refill  . cyclobenzaprine (FLEXERIL) 10 MG tablet TAKE 1 TABLET BY MOUTH THREE TIMES DAILY AS NEEDED FOR MUSCLE SPASMS. (Patient taking differently: Take by mouth 3 (three) times daily as needed. for muscle spams) 30 tablet 1  . diclofenac (VOLTAREN) 75 MG EC tablet TAKE 1 TABLET BY MOUTH TWICE DAILY. 30 tablet 0  . losartan-hydrochlorothiazide (HYZAAR) 50-12.5 MG tablet Take 0.5 tablets by mouth daily.    . tadalafil (CIALIS) 5 MG tablet Take 1 tablet (5 mg total) by mouth daily. 30 tablet 11  . traMADol (ULTRAM) 50 MG tablet TAKE 1 TABLET BY MOUTH EVERY 8 HOURS AS NEEDED FOR UP TO 5 DAYS. 15 tablet 0  . traZODone (DESYREL) 50 MG tablet Take 0.5-1 tablets (25-50 mg total) by mouth at bedtime as needed for sleep. 30 tablet 3  .  atorvastatin (LIPITOR) 20 MG tablet TAKE 1 TABLET (20 MG TOTAL) BY MOUTH AT BEDTIME. 90 tablet 3  . metoprolol tartrate (LOPRESSOR) 25 MG tablet TAKE 1 TABLET BY MOUTH TWICE DAILY. 60 tablet 11  . omeprazole (PRILOSEC) 20 MG capsule TAKE 1 CAPSULE (20 MG TOTAL) BY MOUTH DAILY. 90 capsule 3   No current facility-administered medications for this visit.    Allergies-reviewed and updated No Known Allergies  Social History   Socioeconomic History  . Marital status: Married    Spouse name: Not on file  . Number of children: 0  . Years of education: Not on file  . Highest education level: Some college, no degree  Occupational History  . Not on file  Tobacco Use  . Smoking status: Never Smoker  . Smokeless tobacco: Never Used  Vaping Use  . Vaping Use: Never used  Substance and Sexual Activity  . Alcohol use: Yes    Alcohol/week: 1.0 standard drink    Types: 1 Standard drinks or equivalent per week    Comment: rarely  . Drug use: No  . Sexual activity:  Yes    Birth control/protection: None  Other Topics Concern  . Not on file  Social History Narrative   Right handed    Caffeine 33 -4 cups daily    Social Determinants of Health   Financial Resource Strain: Not on file  Food Insecurity: Not on file  Transportation Needs: Not on file  Physical Activity: Not on file  Stress: Not on file  Social Connections: Not on file        Objective:  Physical Exam: BP 136/82   Pulse 68   Temp 98.5 F (36.9 C) (Temporal)   Ht 6' (1.829 m)   Wt 182 lb 12.8 oz (82.9 kg)   SpO2 99%   BMI 24.79 kg/m   Body mass index is 24.79 kg/m. Wt Readings from Last 3 Encounters:  04/22/20 182 lb 12.8 oz (82.9 kg)  03/11/20 180 lb (81.6 kg)  03/05/20 180 lb 8 oz (81.9 kg)   Gen: NAD, resting comfortably HEENT: TMs normal bilaterally. OP clear. No thyromegaly noted.  CV: RRR with no murmurs appreciated Pulm: NWOB, CTAB with no crackles, wheezes, or rhonchi GI: Normal bowel sounds  present. Soft, Nontender, Nondistended. MSK: no edema, cyanosis, or clubbing noted Skin: warm, dry Neuro: CN2-12 grossly intact. Strength 5/5 in upper and lower extremities. Reflexes symmetric and intact bilaterally.  Psych: Normal affect and thought content     Mason Vargas M. [name], MD 04/22/2020 2:56 PM

## 2020-04-22 NOTE — Assessment & Plan Note (Signed)
Following with neurosurgery.  Handicap placard form filled out today.

## 2020-04-22 NOTE — Assessment & Plan Note (Signed)
Follows with cardiology.  Will be getting repeat scans soon.

## 2020-04-22 NOTE — Patient Instructions (Signed)
It was very nice to see you today!  We will check blood work today.  Please try the trazodone to help you with sleep.  Please try taking the Cialis daily to see if this helps with your urinary issues.  Please send a message in a few weeks let me know how this is working for you.  I will see you back in year for your next annual checkup.  Please come back to see me sooner if needed.  Take care, Dr [name]  PLEASE NOTE:  If you had any lab tests please let us know if you have not heard back within a few days. You may see your results on mychart before we have a chance to review them but we will give you a call once they are reviewed by [place]. If we ordered any referrals today, please let us know if you have not heard from their office within the next week.   Please try these tips to maintain a healthy lifestyle:   Eat at least 3 REAL meals and 1-2 snacks per day.  Aim for no more than 5 hours between eating.  If you eat breakfast, please do so within one hour of getting up.    Each meal should contain half fruits/vegetables, one quarter protein, and one quarter carbs (no bigger than a computer mouse)   Cut down on sweet beverages. This includes juice, soda, and sweet tea.     Drink at least 1 glass of water with each meal and aim for at least 8 glasses per day   Exercise at least 150 minutes every week.    Preventive Care 68-62 Years Old, Male Preventive care refers to lifestyle choices and visits with your health care provider that can promote health and wellness. This includes:  A yearly physical exam. This is also called an annual wellness visit.  Regular dental and eye exams.  Immunizations.  Screening for certain conditions.  Healthy lifestyle choices, such as: ? Eating a healthy diet. ? Getting regular exercise. ? Not using drugs or products that contain nicotine and tobacco. ? Limiting alcohol use. What can I expect for my preventive care visit? Physical  exam Your health care provider will check your:  Height and weight. These may be used to calculate your BMI (body mass index). BMI is a measurement that tells if you are at a healthy weight.  Heart rate and blood pressure.  Body temperature.  Skin for abnormal spots. Counseling Your health care provider may ask you questions about your:  Past medical problems.  Family's medical history.  Alcohol, tobacco, and drug use.  Emotional well-being.  Home life and relationship well-being.  Sexual activity.  Diet, exercise, and sleep habits.  Work and work Statistician.  Access to firearms. What immunizations do I need? Vaccines are usually given at various ages, according to a schedule. Your health care provider will recommend vaccines for you based on your age, medical history, and lifestyle or other factors, such as travel or where you work.   What tests do I need? Blood tests  Lipid and cholesterol levels. These may be checked every 5 years, or more often if you are over 75 years old.  Hepatitis C test.  Hepatitis B test. Screening  Lung cancer screening. You may have this screening every year starting at age 71 if you have a 30-pack-year history of smoking and currently smoke or have quit within the past 15 years.  Prostate cancer screening. Recommendations will vary  depending on your family history and other risks.  Genital exam to check for testicular cancer or hernias.  Colorectal cancer screening. ? All adults should have this screening starting at age 43 and continuing until age 69. ? Your health care provider may recommend screening at age 73 if you are at increased risk. ? You will have tests every 1-10 years, depending on your results and the type of screening test.  Diabetes screening. ? This is done by checking your blood sugar (glucose) after you have not eaten for a while (fasting). ? You may have this done every 1-3 years.  STD (sexually transmitted  disease) testing, if you are at risk. Follow these instructions at home: Eating and drinking  Eat a diet that includes fresh fruits and vegetables, whole grains, lean protein, and low-fat dairy products.  Take vitamin and mineral supplements as recommended by your health care provider.  Do not drink alcohol if your health care provider tells you not to drink.  If you drink alcohol: ? Limit how much you have to 0-2 drinks a day. ? Be aware of how much alcohol is in your drink. In the [place] one drink equals one 12 oz bottle of beer (355 mL), one 5 oz glass of wine (148 mL), or one 1 oz glass of hard liquor (44 mL).   Lifestyle  Take daily care of your teeth and gums. Brush your teeth every morning and night with fluoride toothpaste. Floss one time each day.  Stay active. Exercise for at least 30 minutes 5 or more days each week.  Do not use any products that contain nicotine or tobacco, such as cigarettes, e-cigarettes, and chewing tobacco. If you need help quitting, ask your health care provider.  Do not use drugs.  If you are sexually active, practice safe sex. Use a condom or other form of protection to prevent STIs (sexually transmitted infections).  If told by your health care provider, take low-dose aspirin daily starting at age 62.  Find healthy ways to cope with stress, such as: ? Meditation, yoga, or listening to music. ? Journaling. ? Talking to a trusted person. ? Spending time with friends and family. Safety  Always wear your seat belt while driving or riding in a vehicle.  Do not drive: ? If you have been drinking alcohol. Do not ride with someone who has been drinking. ? When you are tired or distracted. ? While texting.  Wear a helmet and other protective equipment during sports activities.  If you have firearms in your house, make sure you follow all gun safety procedures. What's next?  Go to your health care provider once a year for an annual wellness  visit.  Ask your health care provider how often you should have your eyes and teeth checked.  Stay up to date on all vaccines. This information is not intended to replace advice given to you by your health care provider. Make sure you discuss any questions you have with your health care provider. Document Revised: 09/27/2018 Document Reviewed: 12/23/2017 Elsevier Patient Education  2021 [employer].

## 2020-04-22 NOTE — Assessment & Plan Note (Signed)
Check vitamin D. 

## 2020-04-22 NOTE — Assessment & Plan Note (Signed)
At goal.  Continue losartan-HCTZ 50-12.5 half tablet daily.  Check labs.

## 2020-04-22 NOTE — Assessment & Plan Note (Signed)
Follows with cardiology.  On metoprolol 25 mg twice daily.

## 2020-04-22 NOTE — Assessment & Plan Note (Signed)
Continue Lipitor 2 mg daily.  Check labs today.

## 2020-04-22 NOTE — Assessment & Plan Note (Signed)
Longstanding problem.  Has tried conservative measures.  Was also tried Benadryl and melatonin with no improvement.  Discussed treatment options.  Has been on Lunesta in the distant past which worked reasonably well.  We will start trazodone 50 mg nightly.  May consider trial of gabapentin given his concurrent radicular symptoms if no improvement with trazodone.  Hydroxyzine would be another option.  He will check in with me in a few weeks let me know how trazodone is working for him.

## 2020-04-23 ENCOUNTER — Other Ambulatory Visit (HOSPITAL_COMMUNITY): Payer: Self-pay

## 2020-04-23 LAB — CBC
HCT: 44.2 % (ref 39.0–52.0)
Hemoglobin: 15.2 g/dL (ref 13.0–17.0)
MCHC: 34.4 g/dL (ref 30.0–36.0)
MCV: 81.7 fl (ref 78.0–100.0)
Platelets: 209 10*3/uL (ref 150.0–400.0)
RBC: 5.41 Mil/uL (ref 4.22–5.81)
RDW: 13.8 % (ref 11.5–15.5)
WBC: 5.4 10*3/uL (ref 4.0–10.5)

## 2020-04-23 LAB — COMPREHENSIVE METABOLIC PANEL
ALT: 23 U/L (ref 0–53)
AST: 16 U/L (ref 0–37)
Albumin: 4.4 g/dL (ref 3.5–5.2)
Alkaline Phosphatase: 83 U/L (ref 39–117)
BUN: 18 mg/dL (ref 6–23)
CO2: 27 mEq/L (ref 19–32)
Calcium: 9.2 mg/dL (ref 8.4–10.5)
Chloride: 102 mEq/L (ref 96–112)
Creatinine, Ser: 0.81 mg/dL (ref 0.40–1.50)
GFR: 93.42 mL/min (ref >60.00)
Glucose, Bld: 92 mg/dL (ref 70–99)
Potassium: 3.9 mEq/L (ref 3.5–5.1)
Sodium: 138 mEq/L (ref 135–145)
Total Bilirubin: 2 mg/dL — ABNORMAL HIGH (ref 0.2–1.2)
Total Protein: 6.7 g/dL (ref 6.0–8.3)

## 2020-04-23 LAB — LIPID PANEL
Cholesterol: 113 mg/dL (ref 0–200)
HDL: 41.2 mg/dL (ref >39.00)
LDL Cholesterol: 60 mg/dL (ref 0–99)
NonHDL: 71.33
Total CHOL/HDL Ratio: 3
Triglycerides: 56 mg/dL (ref 0.0–149.0)
VLDL: 11.2 mg/dL (ref 0.0–40.0)

## 2020-04-23 LAB — VITAMIN D 25 HYDROXY (VIT D DEFICIENCY, FRACTURES): VITD: 50.29 ng/mL (ref 30.00–100.00)

## 2020-04-23 LAB — HEMOGLOBIN A1C: Hgb A1c MFr Bld: 5.6 % (ref 4.6–6.5)

## 2020-04-23 LAB — PSA: PSA: 0.29 ng/mL (ref 0.10–4.00)

## 2020-04-23 LAB — TSH: TSH: 1.98 u[IU]/mL (ref 0.35–4.50)

## 2020-04-23 NOTE — Progress Notes (Signed)
Please inform patient of the following:  Labs are all stable. Do not need to make any changes to his treatment plan at this time. Would like for him to keep working on diet and exercise and we can recheck in a year.  Mason Vargas. [name], MD 04/23/2020 1:09 PM

## 2020-04-24 ENCOUNTER — Ambulatory Visit
Admission: RE | Admit: 2020-04-24 | Discharge: 2020-04-24 | Disposition: A | Discharge status: Home / Self Care | Payer: Commercial Managed Care - PPO | Source: Ambulatory Visit | Attending: Student | Admitting: Student

## 2020-04-24 ENCOUNTER — Other Ambulatory Visit: Payer: Self-pay

## 2020-04-24 DIAGNOSIS — M5416 Radiculopathy, lumbar region: Secondary | ICD-10-CM

## 2020-05-13 HISTORY — PX: SPINE SURGERY: SHX786

## 2020-06-11 ENCOUNTER — Other Ambulatory Visit: Payer: Self-pay

## 2020-06-11 NOTE — Telephone Encounter (Signed)
  LAST APPOINTMENT DATE: 04/22/2020   NEXT APPOINTMENT DATE:@Visit  date not found  MEDICATION:Losartan 50-12.5 MG   PHARMACY: [facility], [address] [place]  Comments: Patient is completely out.   Please advise

## 2020-06-12 ENCOUNTER — Other Ambulatory Visit (HOSPITAL_COMMUNITY): Payer: Self-pay

## 2020-06-12 MED ORDER — LOSARTAN POTASSIUM-HCTZ 50-12.5 MG PO TABS
0.5000 | ORAL_TABLET | Freq: Every day | ORAL | 0 refills | Status: DC
  Filled 2020-06-12: qty 15, 30d supply, fill #0

## 2020-06-12 NOTE — Telephone Encounter (Signed)
Patient is calling regarding this medication again

## 2020-06-12 NOTE — Telephone Encounter (Signed)
Rx send to pharmacy  

## 2020-06-12 NOTE — Telephone Encounter (Signed)
Last provider  by historical provider

## 2020-06-13 ENCOUNTER — Encounter: Payer: Self-pay | Admitting: Family Medicine

## 2020-06-13 ENCOUNTER — Ambulatory Visit: Payer: Commercial Managed Care - PPO | Admitting: Family Medicine

## 2020-06-13 ENCOUNTER — Telehealth: Payer: Self-pay

## 2020-06-13 ENCOUNTER — Other Ambulatory Visit: Payer: Self-pay

## 2020-06-13 VITALS — BP 132/80 | HR 80 | Temp 97.9°F | Ht 72.0 in | Wt 181.6 lb

## 2020-06-13 DIAGNOSIS — H00011 Hordeolum externum right upper eyelid: Secondary | ICD-10-CM | POA: Diagnosis not present

## 2020-06-13 MED ORDER — ERYTHROMYCIN 5 MG/GM OP OINT: TOPICAL_OINTMENT | OPHTHALMIC | 0 refills | Status: DC

## 2020-06-13 NOTE — Telephone Encounter (Signed)
See note

## 2020-06-13 NOTE — Patient Instructions (Addendum)
This appears to be a stye to me except no clear head to it- one small red area makes me think that may be focal point- I am glad you are improving over last day at least. You do have some mild irritation of the conjunctiva. We are going to cover for bacterial conjunctivitis (pink eye) though I do not think that is the primary issue- also can use some of this on the upper eyelid which is more swollen (area of concern for stye). Still keep up compresses 4x a day and then can apply.   If pain with eye movement, blurry vision, or progressive symptoms please see [place] back.

## 2020-06-13 NOTE — Telephone Encounter (Signed)
Nurse Assessment Nurse: Sumner Boast, RN, [place] Derry Date/Time Eilene Ghazi Time): 06/13/2020 9:01:47 AM Confirm and document reason for call. If symptomatic, describe symptoms. ---Caller states his right eye is puffy, black and blue, has drainage, and is itchy. Symptoms started 2 days ago. No known injury. It is painful at 5 out of 10 pain scale. Took Aleve this am. The eye looks droopy. No fever. Does the patient have any new or worsening symptoms? ---Yes Will a triage be completed? ---Yes Related visit to physician within the last 2 weeks? ---No Does the PT have any chronic conditions? (i.e. diabetes, asthma, this includes High risk factors for pregnancy, etc.) ---Yes List chronic conditions. ---Hypertension Is this a behavioral health or substance abuse call? ---No Guidelines Guideline Title Affirmed Question Affirmed Notes Nurse Date/Time Eilene Ghazi Time) Eye - Swelling Eyelid is red and painful (or tender to touch) Sumner Boast, RN, En[place] Derry 06/13/2020 9:07:10 AM Disp. Time Eilene Ghazi Time) Disposition Final User PLEASE NOTE: All timestamps contained within this report are represented as [place] Standard Time. CONFIDENTIALTY NOTICE: This fax transmission is intended only for the addressee. It contains information that is legally privileged, confidential or otherwise protected from use or disclosure. If you are not the intended recipient, you are strictly prohibited from reviewing, disclosing, copying using or disseminating any of this information or taking any action in reliance on or regarding this information. If you have received this fax in error, please notify us immediately by telephone so that we can arrange for its return to [place]. Phone: [phone], Toll-Free: [phone], Fax: [phone] Page: 2 of 2 Call Id: [id number] 06/13/2020 9:10:05 AM See PCP within 24 Hours Yes Sumner Boast, RN, Teola Bradley Disagree/Comply Comply Caller Understands Yes PreDisposition Call Doctor Care Advice Given Per  Guideline SEE PCP WITHIN 24 HOURS: CALL BACK IF: * You become worse CARE ADVICE given per Eye - Swelling (Adult) guideline. Referrals REFERRED TO PCP OFFICE

## 2020-06-13 NOTE — Progress Notes (Signed)
Phone [phone] In person visit   Subjective:   Mason Vargas is a 65 y.o. year old very pleasant male patient who presents for/with See problem oriented charting Chief Complaint  Patient presents with  . Facial Swelling    Right eye , Started 3 days ago. Its tender to the touch.    This visit occurred during the SARS-CoV-2 public health emergency.  Safety protocols were in place, including screening questions prior to the visit, additional usage of staff PPE, and extensive cleaning of exam room while observing appropriate contact time as indicated for disinfecting solutions.   Past Medical History-  Patient Active Problem List   Diagnosis Date Noted  . Insomnia 04/22/2020  . Thoracic aortic aneurysm without rupture ([place]) 03/11/2020  . SVT (supraventricular tachycardia) ([place]) 03/11/2020  . Low back pain 10/31/2019  . Abnormal chest x-ray 02/24/2019  . Radiculopathy, lumbar region 09/23/2018  . Atrophy of quadriceps femoris muscle 09/13/2018  . GERD (gastroesophageal reflux disease) 03/22/2018  . Hypertension 03/22/2018  . Lumbar herniated disc 03/22/2014  . BPH (benign prostatic hyperplasia) 03/03/2012  . Family history of ischemic heart disease (IHD) 08/10/2011  . Colon adenoma 06/11/2011  . Vitamin D deficiency 06/11/2011  . ED (erectile dysfunction) 06/02/2011  . Hyperlipidemia 03/22/2002    Medications- reviewed and updated Current Outpatient Medications  Medication Sig Dispense Refill  . atorvastatin (LIPITOR) 20 MG tablet TAKE 1 TABLET (20 MG TOTAL) BY MOUTH AT BEDTIME. 90 tablet 3  . cyclobenzaprine (FLEXERIL) 10 MG tablet TAKE 1 TABLET BY MOUTH THREE TIMES DAILY AS NEEDED FOR MUSCLE SPASMS. (Patient taking differently: Take by mouth 3 (three) times daily as needed. for muscle spams) 30 tablet 1  . diclofenac (VOLTAREN) 75 MG EC tablet TAKE 1 TABLET BY MOUTH TWICE DAILY. 30 tablet 0  . erythromycin ophthalmic ointment One-half inch (1.25 cm) four times daily for 5 to  7 days to affected eye on right 3.5 g 0  . losartan-hydrochlorothiazide (HYZAAR) 50-12.5 MG tablet Take 1/2 tablets by mouth daily. 90 tablet 0  . metoprolol tartrate (LOPRESSOR) 25 MG tablet TAKE 1 TABLET BY MOUTH TWICE DAILY. 60 tablet 11  . omeprazole (PRILOSEC) 20 MG capsule TAKE 1 CAPSULE (20 MG TOTAL) BY MOUTH DAILY. 90 capsule 3  . tadalafil (CIALIS) 5 MG tablet Take 1 tablet (5 mg total) by mouth daily. 30 tablet 11  . traMADol (ULTRAM) 50 MG tablet TAKE 1 TABLET BY MOUTH EVERY 8 HOURS AS NEEDED FOR UP TO 5 DAYS. 15 tablet 0  . traZODone (DESYREL) 50 MG tablet Take 0.5-1 tablets (25-50 mg total) by mouth at bedtime as needed for sleep. 30 tablet 3   No current facility-administered medications for this visit.     Objective:  BP 132/80   Pulse 80   Temp 97.9 F (36.6 C) (Temporal)   Ht 6' (1.829 m)   Wt 181 lb 9.6 oz (82.4 kg)   SpO2 98%   BMI 24.63 kg/m  Gen: NAD, resting comfortably HEENT: Oropharynx large and normal, TMs normal, nasal turbinates normal CV: RRR no murmurs rubs or gallops Lungs: CTAB no crackles, wheeze, rhonchi Abdomen: soft/nontender/nondistended/normal bowel sounds. No rebound or guarding.  Ext: no edema Skin: warm, dry Neuro: grossly normal, moves all extremities Eyes: PERRLA, mild conjunctival erythema on lower lateral segment of right eye-also with erythema and swelling of right upper eyelid- more on lateral segment- central area of redness but no clear "head"    Assessment and Plan   # Eyelid swelling/redness  S:3 days ago- felt as if sand was in his eye. He noticed the next morning that his eye started to get very puffy- certainly worse on upper eyelid and more laterally- that area is most sollen and tender- perhaps 3/10 discomfort. last night his wife placed a tea bag on it and that helped. He noticed that there is drainage. No pain with eye movement, no blurry vision.  This has never happened to him before. He is doing warm compresses to ease the  swelling and that is helping some A/P:From AVS "  Patient Instructions  This appears to be a stye to me except no clear head to it- one small red area makes me think that may be focal point- I am glad you are improving over last day at least. You do have some mild irritation of the conjunctiva. We are going to cover for bacterial conjunctivitis (pink eye) though I do not think that is the primary issue- also can use some of this on the upper eyelid which is more swollen (area of concern for stye). Still keep up compresses 4x a day and then can apply.   If pain with eye movement, blurry vision, or progressive symptoms please see [place] back.   "  Recommended follow up:No follow-ups on file. Future Appointments  Date Time Provider [place]  08/23/2020  7:30 AM [facility]  08/30/2020  8:30 AM [facility]    Lab/Order associations:   ICD-10-CM   1. Hordeolum externum of right upper eyelid  H00.011     Meds ordered this encounter  Medications  . erythromycin ophthalmic ointment    Sig: One-half inch (1.25 cm) four times daily for 5 to 7 days to affected eye on right    Dispense:  3.5 g    Refill:  0   I,Harris Phan,acting as a scribe for Garret Reddish, MD.,have documented all relevant documentation on the behalf of Garret Reddish, MD,as directed by  Garret Reddish, MD while in the presence of Garret Reddish, MD.   I, Garret Reddish, MD, have reviewed all documentation for this visit. The documentation on 06/13/20 for the exam, diagnosis, procedures, and orders are all accurate and complete.   Return precautions advised.  Garret Reddish, MD

## 2020-06-17 ENCOUNTER — Encounter: Payer: Self-pay | Admitting: Family Medicine

## 2020-06-18 NOTE — Telephone Encounter (Signed)
Please advise 

## 2020-06-19 ENCOUNTER — Other Ambulatory Visit: Payer: Self-pay | Admitting: *Deleted

## 2020-06-19 DIAGNOSIS — H00011 Hordeolum externum right upper eyelid: Secondary | ICD-10-CM

## 2020-06-19 MED ORDER — DOXYCYCLINE MONOHYDRATE 100 MG PO TABS: 100.0000 mg | ORAL_TABLET | Freq: Two times a day (BID) | ORAL | 0 refills | Status: AC

## 2020-06-19 NOTE — Telephone Encounter (Signed)
Rx send to [facility]  Referral placed  Patient aware

## 2020-07-04 ENCOUNTER — Other Ambulatory Visit: Payer: Self-pay | Admitting: *Deleted

## 2020-07-04 ENCOUNTER — Other Ambulatory Visit (HOSPITAL_COMMUNITY): Payer: Commercial Managed Care - PPO

## 2020-07-04 MED ORDER — TADALAFIL 5 MG PO TABS: 5.0000 mg | ORAL_TABLET | Freq: Every day | ORAL | 3 refills | Status: DC

## 2020-07-08 ENCOUNTER — Telehealth: Payer: Self-pay

## 2020-07-08 MED ORDER — LOSARTAN POTASSIUM-HCTZ 50-12.5 MG PO TABS: 0.5000 | ORAL_TABLET | Freq: Every day | ORAL | 1 refills | Status: DC

## 2020-07-08 MED ORDER — LOSARTAN POTASSIUM-HCTZ 50-12.5 MG PO TABS: 0.5000 | ORAL_TABLET | Freq: Every day | ORAL | 0 refills | Status: DC

## 2020-07-08 NOTE — Telephone Encounter (Signed)
Spoke to pt told him Rx for Losartan-HCTZ was sent to [facility] on 6/1. Pt said yes, but he only got 15 pills and only has one pill left. Told pt okay I can send 10 day supply to local pharmacy and 90 day supply to [facility] for you. Pt verbalized understanding. Rx's sent.

## 2020-07-08 NOTE — Telephone Encounter (Signed)
Pt called in needing a refill for losartan. He stated that his pharmacy was [facility] but he changed over to [facility]. He called [facility] to get it refilled but he stated that it was denied. He also stated that he has one pill left and he is worried about not having the medication. The best number to reach him is [phone].

## 2020-07-17 ENCOUNTER — Telehealth: Payer: Self-pay

## 2020-07-17 ENCOUNTER — Encounter: Payer: Self-pay | Admitting: *Deleted

## 2020-07-17 ENCOUNTER — Other Ambulatory Visit: Payer: Self-pay

## 2020-07-17 ENCOUNTER — Ambulatory Visit (INDEPENDENT_AMBULATORY_CARE_PROVIDER_SITE_OTHER): Payer: Commercial Managed Care - PPO | Admitting: *Deleted

## 2020-07-17 DIAGNOSIS — Z23 Encounter for immunization: Secondary | ICD-10-CM | POA: Diagnosis not present

## 2020-07-17 NOTE — Progress Notes (Signed)
Per orders of Dr. [name], injection of Tdap 0.5 ml given IM by Anselmo Pickler, LPN in left deltoid. Patient tolerated injection well.

## 2020-07-17 NOTE — Telephone Encounter (Signed)
Patient called stating that he received a print out of his vaccine record for his new employer. He said that we looked over the records when hr got home and noticed that it says that he received the HPV vaccine in 2016 but he said that he never got that vaccine. Can someone call him at [phone]

## 2020-07-17 NOTE — Progress Notes (Signed)
I have reviewed the patient's encounter and agree with the documentation.  Mason Vargas. [name], MD 07/17/2020 9:27 AM

## 2020-07-18 NOTE — Telephone Encounter (Signed)
Can you look into this please

## 2020-07-19 NOTE — Telephone Encounter (Signed)
Left message on voicemail to call office.  

## 2020-07-22 NOTE — Telephone Encounter (Signed)
Spoke to pt told him calling about message and HPV vaccine. Told him if he is sure he did not have vaccine I will remove due to being historical for [place] and we have no proof. Pt verbalized understanding and said he went back through his records and has never received vaccines. Told pt okay will remove it now. Pt verbalized understanding. HPV vaccine removed from pt's record.

## 2020-07-31 ENCOUNTER — Other Ambulatory Visit (HOSPITAL_COMMUNITY): Payer: Self-pay

## 2020-07-31 MED FILL — Cyclobenzaprine HCl Tab 10 MG: ORAL | 10 days supply | Qty: 30 | Fill #0 | Status: AC

## 2020-08-08 ENCOUNTER — Other Ambulatory Visit: Payer: Self-pay | Admitting: Family Medicine

## 2020-08-23 ENCOUNTER — Other Ambulatory Visit: Payer: Self-pay

## 2020-08-23 ENCOUNTER — Other Ambulatory Visit: Payer: Commercial Managed Care - PPO | Admitting: *Deleted

## 2020-08-23 DIAGNOSIS — I10 Essential (primary) hypertension: Secondary | ICD-10-CM

## 2020-08-23 DIAGNOSIS — I712 Thoracic aortic aneurysm, without rupture, unspecified: Secondary | ICD-10-CM

## 2020-08-23 LAB — BASIC METABOLIC PANEL
BUN/Creatinine Ratio: 26 — ABNORMAL HIGH (ref 10–24)
BUN: 23 mg/dL (ref 8–27)
CO2: 26 mmol/L (ref 20–29)
Calcium: 8.6 mg/dL (ref 8.6–10.2)
Chloride: 101 mmol/L (ref 96–106)
Creatinine, Ser: 0.87 mg/dL (ref 0.76–1.27)
Glucose: 104 mg/dL — ABNORMAL HIGH (ref 65–99)
Potassium: 3.7 mmol/L (ref 3.5–5.2)
Sodium: 140 mmol/L (ref 134–144)
eGFR: 96 mL/min/{1.73_m2} (ref >59)

## 2020-08-30 ENCOUNTER — Ambulatory Visit (INDEPENDENT_AMBULATORY_CARE_PROVIDER_SITE_OTHER)
Admission: RE | Admit: 2020-08-30 | Discharge: 2020-08-30 | Disposition: A | Discharge status: Home / Self Care | Payer: Commercial Managed Care - PPO | Source: Ambulatory Visit | Attending: Internal Medicine | Admitting: Internal Medicine

## 2020-08-30 ENCOUNTER — Other Ambulatory Visit: Payer: Self-pay

## 2020-08-30 DIAGNOSIS — I712 Thoracic aortic aneurysm, without rupture, unspecified: Secondary | ICD-10-CM

## 2020-08-30 MED ORDER — IOHEXOL 350 MG/ML SOLN
100.0000 mL | Freq: Once | INTRAVENOUS | Status: AC | PRN
  Administered 2020-08-30: 100 mL via INTRAVENOUS

## 2020-09-16 ENCOUNTER — Other Ambulatory Visit: Payer: Self-pay | Admitting: Family Medicine

## 2020-09-16 ENCOUNTER — Encounter: Payer: Self-pay | Admitting: Family Medicine

## 2020-09-18 ENCOUNTER — Other Ambulatory Visit: Payer: Self-pay | Admitting: *Deleted

## 2020-09-18 MED ORDER — LOSARTAN POTASSIUM-HCTZ 50-12.5 MG PO TABS: 1.0000 | ORAL_TABLET | Freq: Every day | ORAL | 0 refills | Status: DC

## 2020-09-18 NOTE — Telephone Encounter (Signed)
Patient taking Losartan -HCTZ 50/12.5 daily  Rx send to Pharmacy

## 2020-11-25 ENCOUNTER — Other Ambulatory Visit: Payer: Self-pay | Admitting: Family Medicine

## 2020-11-25 ENCOUNTER — Encounter: Payer: Self-pay | Admitting: Family Medicine

## 2020-11-27 ENCOUNTER — Other Ambulatory Visit: Payer: Self-pay | Admitting: *Deleted

## 2020-11-27 DIAGNOSIS — E785 Hyperlipidemia, unspecified: Secondary | ICD-10-CM

## 2020-11-27 MED ORDER — TRAZODONE HCL 50 MG PO TABS: 25.0000 mg | ORAL_TABLET | Freq: Every evening | ORAL | 11 refills | Status: DC | PRN

## 2020-11-27 MED ORDER — LOSARTAN POTASSIUM-HCTZ 50-12.5 MG PO TABS: 0.5000 | ORAL_TABLET | Freq: Every day | ORAL | 3 refills | Status: DC

## 2020-11-27 MED ORDER — TADALAFIL 5 MG PO TABS: 5.0000 mg | ORAL_TABLET | Freq: Every day | ORAL | 11 refills | Status: DC

## 2020-11-27 MED ORDER — ATORVASTATIN CALCIUM 20 MG PO TABS: ORAL_TABLET | Freq: Every day | ORAL | 3 refills | Status: DC

## 2020-11-27 MED ORDER — METOPROLOL TARTRATE 25 MG PO TABS: 25.0000 mg | ORAL_TABLET | Freq: Two times a day (BID) | ORAL | 11 refills | Status: DC

## 2020-12-24 ENCOUNTER — Other Ambulatory Visit: Payer: Self-pay | Admitting: *Deleted

## 2020-12-24 ENCOUNTER — Encounter: Payer: Self-pay | Admitting: Family Medicine

## 2020-12-24 MED ORDER — OMEPRAZOLE 20 MG PO CPDR: DELAYED_RELEASE_CAPSULE | Freq: Every day | ORAL | 3 refills | Status: DC

## 2021-01-27 ENCOUNTER — Encounter: Payer: Self-pay | Admitting: Family Medicine

## 2021-01-27 ENCOUNTER — Other Ambulatory Visit: Payer: Self-pay | Admitting: *Deleted

## 2021-01-27 MED ORDER — LOSARTAN POTASSIUM-HCTZ 50-12.5 MG PO TABS: 0.5000 | ORAL_TABLET | Freq: Every day | ORAL | 0 refills | Status: DC

## 2021-01-27 NOTE — Telephone Encounter (Signed)
Ok to send in refill for whole tablet. Please have him schedule a follow up appointment soon.  [name]. [name], MD 01/27/2021 12:54 PM

## 2021-01-31 ENCOUNTER — Other Ambulatory Visit: Payer: Self-pay

## 2021-01-31 ENCOUNTER — Encounter: Payer: Self-pay | Admitting: Family Medicine

## 2021-01-31 ENCOUNTER — Ambulatory Visit: Payer: Commercial Managed Care - PPO | Admitting: Family Medicine

## 2021-01-31 VITALS — BP 132/78 | HR 60 | Temp 98.2°F | Ht 72.0 in | Wt 188.0 lb

## 2021-01-31 DIAGNOSIS — K219 Gastro-esophageal reflux disease without esophagitis: Secondary | ICD-10-CM | POA: Diagnosis not present

## 2021-01-31 DIAGNOSIS — I10 Essential (primary) hypertension: Secondary | ICD-10-CM

## 2021-01-31 MED ORDER — LOSARTAN POTASSIUM-HCTZ 50-12.5 MG PO TABS: 1.0000 | ORAL_TABLET | Freq: Every day | ORAL | 3 refills | Status: DC

## 2021-01-31 NOTE — Assessment & Plan Note (Signed)
We will try taking omeprazole only at night to see if this helps.  If symptoms return will be fine for him to go to twice daily dosing versus higher once daily dose.  He will send me a message in a few weeks to let me know how he is doing.  We can readdress at his upcoming CPE in a few months.

## 2021-01-31 NOTE — Assessment & Plan Note (Signed)
At goal on losartan-HCTZ 50-12.5 once daily.  We will continue with his current dose.  We will send in refill today.  We will follow-up in 3 months for CPE and we can recheck blood pressure and labs at that time.  He will let me know if he has any side effects with the higher dose.

## 2021-01-31 NOTE — Progress Notes (Signed)
° °  Mason Vargas is a 66 y.o. male who presents today for an office visit.  Assessment/Plan:  Chronic Problems Addressed Today: Hypertension At goal on losartan-HCTZ 50-12.5 once daily.  We will continue with his current dose.  We will send in refill today.  We will follow-up in 3 months for CPE and we can recheck blood pressure and labs at that time.  He will let me know if he has any side effects with the higher dose.   GERD (gastroesophageal reflux disease) We will try taking omeprazole only at night to see if this helps.  If symptoms return will be fine for him to go to twice daily dosing versus higher once daily dose.  He will send me a message in a few weeks to let me know how he is doing.  We can readdress at his upcoming CPE in a few months.     Subjective:  HPI:  See A/p for status of chronic conditions.  He has increased his dose of losartan-HCTZ to 50-12.5 once daily after noticing some elevated blood pressures.  He has done well with increased dose.  No side effects.  Has also noticed worsening reflux.  He has been on a few pounds recently and thinks this could be contributing.  He tried taking his omeprazole twice daily which seemed to alleviate symptoms.       Objective:  Physical Exam: BP 132/78    Pulse 60    Temp 98.2 F (36.8 C) (Temporal)    Ht 6' (1.829 m)    Wt 188 lb (85.3 kg)    SpO2 98%    BMI 25.50 kg/m   Gen: No acute distress, resting comfortably CV: Regular rate and rhythm with no murmurs appreciated Pulm: Normal work of breathing, clear to auscultation bilaterally with no crackles, wheezes, or rhonchi Neuro: Grossly normal, moves all extremities Psych: Normal affect and thought content      Mason Vargas M. [name], MD 01/31/2021 2:46 PM

## 2021-01-31 NOTE — Patient Instructions (Addendum)
It was very nice to see you today!  Please keep taking a full tablet of your blood pressure medication.  You can try taking your acid blocking medication at night only to see if this helps.  If you are still having issues it is okay to take twice daily.  Please send a message in a few weeks to let me know how you are doing.  I will see back in a few months for your annual physical.  Come back sooner if needed.  Take care, Dr [name]  PLEASE NOTE:  If you had any lab tests please let us know if you have not heard back within a few days. You may see your results on mychart before we have a chance to review them but we will give you a call once they are reviewed by [place]. If we ordered any referrals today, please let us know if you have not heard from their office within the next week.   Please try these tips to maintain a healthy lifestyle:  Eat at least 3 REAL meals and 1-2 snacks per day.  Aim for no more than 5 hours between eating.  If you eat breakfast, please do so within one hour of getting up.   Each meal should contain half fruits/vegetables, one quarter protein, and one quarter carbs (no bigger than a computer mouse)  Cut down on sweet beverages. This includes juice, soda, and sweet tea.   Drink at least 1 glass of water with each meal and aim for at least 8 glasses per day  Exercise at least 150 minutes every week.

## 2021-05-05 ENCOUNTER — Encounter: Payer: Self-pay | Admitting: Family Medicine

## 2021-05-05 ENCOUNTER — Ambulatory Visit (INDEPENDENT_AMBULATORY_CARE_PROVIDER_SITE_OTHER): Payer: Commercial Managed Care - PPO | Admitting: Family Medicine

## 2021-05-05 VITALS — BP 117/79 | HR 59 | Temp 97.7°F | Ht 72.0 in | Wt 188.8 lb

## 2021-05-05 DIAGNOSIS — N400 Enlarged prostate without lower urinary tract symptoms: Secondary | ICD-10-CM | POA: Diagnosis not present

## 2021-05-05 DIAGNOSIS — R739 Hyperglycemia, unspecified: Secondary | ICD-10-CM

## 2021-05-05 DIAGNOSIS — E559 Vitamin D deficiency, unspecified: Secondary | ICD-10-CM

## 2021-05-05 DIAGNOSIS — I10 Essential (primary) hypertension: Secondary | ICD-10-CM | POA: Diagnosis not present

## 2021-05-05 DIAGNOSIS — E785 Hyperlipidemia, unspecified: Secondary | ICD-10-CM | POA: Diagnosis not present

## 2021-05-05 DIAGNOSIS — Z23 Encounter for immunization: Secondary | ICD-10-CM

## 2021-05-05 DIAGNOSIS — Z125 Encounter for screening for malignant neoplasm of prostate: Secondary | ICD-10-CM | POA: Diagnosis not present

## 2021-05-05 DIAGNOSIS — N529 Male erectile dysfunction, unspecified: Secondary | ICD-10-CM

## 2021-05-05 DIAGNOSIS — M5416 Radiculopathy, lumbar region: Secondary | ICD-10-CM

## 2021-05-05 DIAGNOSIS — Z0001 Encounter for general adult medical examination with abnormal findings: Secondary | ICD-10-CM

## 2021-05-05 LAB — COMPREHENSIVE METABOLIC PANEL
ALT: 43 U/L (ref 0–53)
AST: 31 U/L (ref 0–37)
Albumin: 4.3 g/dL (ref 3.5–5.2)
Alkaline Phosphatase: 107 U/L (ref 39–117)
BUN: 16 mg/dL (ref 6–23)
CO2: 29 mEq/L (ref 19–32)
Calcium: 8.8 mg/dL (ref 8.4–10.5)
Chloride: 102 mEq/L (ref 96–112)
Creatinine, Ser: 0.76 mg/dL (ref 0.40–1.50)
GFR: 94.55 mL/min (ref >60.00)
Glucose, Bld: 114 mg/dL — ABNORMAL HIGH (ref 70–99)
Potassium: 3.9 mEq/L (ref 3.5–5.1)
Sodium: 138 mEq/L (ref 135–145)
Total Bilirubin: 1.9 mg/dL — ABNORMAL HIGH (ref 0.2–1.2)
Total Protein: 6.6 g/dL (ref 6.0–8.3)

## 2021-05-05 LAB — CBC
HCT: 40.8 % (ref 39.0–52.0)
Hemoglobin: 14.1 g/dL (ref 13.0–17.0)
MCHC: 34.4 g/dL (ref 30.0–36.0)
MCV: 81.5 fl (ref 78.0–100.0)
Platelets: 155 10*3/uL (ref 150.0–400.0)
RBC: 5.01 Mil/uL (ref 4.22–5.81)
RDW: 13.6 % (ref 11.5–15.5)
WBC: 3.5 10*3/uL — ABNORMAL LOW (ref 4.0–10.5)

## 2021-05-05 LAB — LIPID PANEL
Cholesterol: 106 mg/dL (ref 0–200)
HDL: 36.1 mg/dL — ABNORMAL LOW (ref >39.00)
LDL Cholesterol: 51 mg/dL (ref 0–99)
NonHDL: 69.53
Total CHOL/HDL Ratio: 3
Triglycerides: 93 mg/dL (ref 0.0–149.0)
VLDL: 18.6 mg/dL (ref 0.0–40.0)

## 2021-05-05 LAB — VITAMIN D 25 HYDROXY (VIT D DEFICIENCY, FRACTURES): VITD: 42 ng/mL (ref 30.00–100.00)

## 2021-05-05 LAB — TSH: TSH: 2.44 u[IU]/mL (ref 0.35–5.50)

## 2021-05-05 LAB — TESTOSTERONE: Testosterone: 502.28 ng/dL (ref 300.00–890.00)

## 2021-05-05 LAB — HEMOGLOBIN A1C: Hgb A1c MFr Bld: 5.7 % (ref 4.6–6.5)

## 2021-05-05 LAB — PSA: PSA: 0.17 ng/mL (ref 0.10–4.00)

## 2021-05-05 NOTE — Progress Notes (Signed)
? ?Chief Complaint:  ?Mason Vargas is a 66 y.o. male who presents today for his annual comprehensive physical exam.   ? ?Assessment/Plan:  ?Chronic Problems Addressed Today: ?Radiculopathy, lumbar region ?Follows with neurosurgery.  Symptoms are manageable still bothersome.  Handicap placard form filled out today. ? ?Hyperlipidemia ?Lipid panel normal at goal.  We will continue Lipitor 20 mg daily.  Check labs today. ? ?Vitamin D deficiency ?Check vit D. ? ?Hypertension ?At goal on losartan-HCTZ 50-12.5 once daily and metoprolol tartrate 25 mg twice daily.  Check labs. ? ?ED (erectile dysfunction) ?Cialis seems to be working well we is concerned about possible low testosterone due to decreased libido.  We will check testosterone level today. ? ?BPH (benign prostatic hyperplasia) ?Check PSA. ? ?Preventative Healthcare: ?Prevnar 20 given today.  Check labs.  We will also check cardiac CT scan screening for coronary artery disease. ? ?Patient Counseling(The following topics were reviewed and/or handout was given): ? -Nutrition: Stressed importance of moderation in sodium/caffeine intake, saturated fat and cholesterol, caloric balance, sufficient intake of fresh fruits, vegetables, and fiber. ? -Stressed the importance of regular exercise.  ? -Substance Abuse: Discussed cessation/primary prevention of tobacco, alcohol, or other drug use; driving or other dangerous activities under the influence; availability of treatment for abuse.  ? -Injury prevention: Discussed safety belts, safety helmets, smoke detector, smoking near bedding or upholstery.  ? -Sexuality: Discussed sexually transmitted diseases, partner selection, use of condoms, avoidance of unintended pregnancy and contraceptive alternatives.  ? -Dental health: Discussed importance of regular tooth brushing, flossing, and dental visits. ? -Health maintenance and immunizations reviewed. Please refer to Health maintenance section. ? ?Return to care in 1 year for  next preventative visit.  ? ?  ?Subjective:  ?HPI: ? ?He has no acute complaints today. See A/p for status of chronic conditions.  ? ?Lifestyle ?Diet: Balanced. Tries to get plenty of fruits and vegetables.  ?Exercise: Regular. Tries to do a lot of walking.  ? ? ?  05/05/2021  ?  9:22 AM  ?Depression screen PHQ 2/9  ?Decreased Interest 0  ?Down, Depressed, Hopeless 0  ?PHQ - 2 Score 0  ? ? ?Health Maintenance Due  ?Topic Date Due  ? HIV Screening  Never done  ? Hepatitis C Screening  Never done  ? Pneumonia Vaccine 14+ Years old (2 - PPSV23 if available, else PCV20) 03/05/2021  ?  ? ?ROS: Per HPI, otherwise a complete review of systems was negative.  ? ?PMH: ? ?The following were reviewed and entered/updated in epic: ?Past Medical History:  ?Diagnosis Date  ? GERD (gastroesophageal reflux disease)   ? on meds  ? Hyperlipidemia   ? on meds  ? Hypertension   ? on meds  ? Insomnia   ? Lumbar herniated disc   ? Sciatica 04/08/2018  ? Left leg  ? Snores   ? ?Patient Active Problem List  ? Diagnosis Date Noted  ? Insomnia 04/22/2020  ? Thoracic aortic aneurysm without rupture ([place]) 03/11/2020  ? SVT (supraventricular tachycardia) ([place]) 03/11/2020  ? Low back pain 10/31/2019  ? Abnormal chest x-ray 02/24/2019  ? Radiculopathy, lumbar region 09/23/2018  ? Atrophy of quadriceps femoris muscle 09/13/2018  ? GERD (gastroesophageal reflux disease) 03/22/2018  ? Hypertension 03/22/2018  ? Lumbar herniated disc 03/22/2014  ? BPH (benign prostatic hyperplasia) 03/03/2012  ? Family history of ischemic heart disease (IHD) 08/10/2011  ? Colon adenoma 06/11/2011  ? Vitamin D deficiency 06/11/2011  ? ED (erectile dysfunction) 06/02/2011  ?  Hyperlipidemia 03/22/2002  ? ?Past Surgical History:  ?Procedure Laterality Date  ? CLOSED REDUCTION NASAL FRACTURE N/A 05/26/2018  ? Procedure: CLOSED REDUCTION NASAL FRACTURE;  Surgeon: Rozetta Nunnery, MD;  Location: [facility];  Service: ENT;  Laterality: N/A;  ? COLONOSCOPY W/  POLYPECTOMY  2014  ? LUMBAR DISC SURGERY    ? SPINE SURGERY  05/13/2020  ? WISDOM TOOTH EXTRACTION    ? ? ?Family History  ?Problem Relation Age of Onset  ? Heart attack Father   ? Heart Problems Father   ? Hypertension Father   ? Hyperlipidemia Father   ? Colon cancer Neg Hx   ? Pancreatic cancer Neg Hx   ? Stomach cancer Neg Hx   ? Colon polyps Neg Hx   ? Esophageal cancer Neg Hx   ? Rectal cancer Neg Hx   ? ? ?Medications- reviewed and updated ?Current Outpatient Medications  ?Medication Sig Dispense Refill  ? atorvastatin (LIPITOR) 20 MG tablet TAKE 1 TABLET (20 MG TOTAL) BY MOUTH AT BEDTIME. 90 tablet 3  ? losartan-hydrochlorothiazide (HYZAAR) 50-12.5 MG tablet Take 1 tablet by mouth daily. 90 tablet 3  ? metoprolol tartrate (LOPRESSOR) 25 MG tablet TAKE 1 TABLET BY MOUTH TWICE DAILY. 60 tablet 11  ? omeprazole (PRILOSEC) 20 MG capsule TAKE 1 CAPSULE (20 MG TOTAL) BY MOUTH DAILY. 90 capsule 3  ? tadalafil (CIALIS) 5 MG tablet Take 1 tablet (5 mg total) by mouth daily. 30 tablet 11  ? traZODone (DESYREL) 50 MG tablet Take 0.5-1 tablets (25-50 mg total) by mouth at bedtime as needed. for sleep 30 tablet 11  ? ?No current facility-administered medications for this visit.  ? ? ?Allergies-reviewed and updated ?No Known Allergies ? ?Social History  ? ?Socioeconomic History  ? Marital status: Married  ?  Spouse name: Not on file  ? Number of children: 0  ? Years of education: Not on file  ? Highest education level: Some college, no degree  ?Occupational History  ? Not on file  ?Tobacco Use  ? Smoking status: Never  ? Smokeless tobacco: Never  ?Vaping Use  ? Vaping Use: Never used  ?Substance and Sexual Activity  ? Alcohol use: Yes  ?  Alcohol/week: 1.0 standard drink  ?  Types: 1 Standard drinks or equivalent per week  ?  Comment: rarely  ? Drug use: No  ? Sexual activity: Yes  ?  Birth control/protection: None  ?Other Topics Concern  ? Not on file  ?Social History Narrative  ? Right handed   ? Caffeine 33 -4 cups  daily   ? 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 His wife was a DMP.  ? ?Social Determinants of Health  ? ?Financial Resource Strain: Not on file  ?Food Insecurity: Not on file  ?Transportation Needs: Not on file  ?Physical Activity: Not on file  ?Stress: Not on file  ?Social Connections: Not on file  ? ?   ?  ?Objective:  ?Physical Exam: ?BP 117/79   Pulse (!) 59   Temp 97.7 ?F (36.5 ?C) (Temporal)   Ht 6' (1.829 m)   Wt 188 lb 12.8 oz (85.6 kg)   SpO2 99%   BMI 25.61 kg/m?   ?Body mass index is 25.61 kg/m?. ?Wt Readings from Last 3 Encounters:  ?05/05/21 188 lb 12.8 oz (85.6 kg)  ?01/31/21 188 lb (85.3 kg)  ?06/13/20 181 lb 9.6 oz (82.4 kg)  ? ?Gen: NAD, resting comfortably ?HEENT: TMs normal  bilaterally. OP clear. No thyromegaly noted.  ?CV: RRR with no murmurs appreciated ?Pulm: NWOB, CTAB with no crackles, wheezes, or rhonchi ?GI: Normal bowel sounds present. Soft, Nontender, Nondistended. ?MSK: no edema, cyanosis, or clubbing noted ?Skin: warm, dry ?Neuro: CN2-12 grossly intact. Strength 5/5 in upper and lower extremities. Reflexes symmetric and intact bilaterally.  ?Psych: Normal affect and thought content ?   ? ?[name]. [name], MD ?05/05/2021 10:03 AM  ?

## 2021-05-05 NOTE — Assessment & Plan Note (Signed)
Cialis seems to be working well we is concerned about possible low testosterone due to decreased libido.  We will check testosterone level today. ?

## 2021-05-05 NOTE — Assessment & Plan Note (Signed)
Follows with neurosurgery.  Symptoms are manageable still bothersome.  Handicap placard form filled out today. ?

## 2021-05-05 NOTE — Assessment & Plan Note (Signed)
Lipid panel normal at goal.  We will continue Lipitor 20 mg daily.  Check labs today. ?

## 2021-05-05 NOTE — Assessment & Plan Note (Signed)
At goal on losartan-HCTZ 50-12.5 once daily and metoprolol tartrate 25 mg twice daily.  Check labs. ?

## 2021-05-05 NOTE — Assessment & Plan Note (Signed)
Check PSA. ?

## 2021-05-05 NOTE — Addendum Note (Signed)
Addended by: Betti Cruz on: 05/05/2021 10:14 AM ? ? Modules accepted: Orders ? ?

## 2021-05-05 NOTE — Patient Instructions (Signed)
It was very nice to see you today! ? ?We will Check blood work today. ? ?We will give your pneumonia shot today. ? ?We will check a CT scan to look for calcification in your heart. ? ?We will see back in year for your next physical.  Come back sooner if needed.  Take care, ?Dr [name] ? ?PLEASE NOTE: ? ?If you had any lab tests please let us know if you have not heard back within a few days. You may see your results on mychart before we have a chance to review them but we will give you a call once they are reviewed by [place]. If we ordered any referrals today, please let us know if you have not heard from their office within the next week.  ? ?Please try these tips to maintain a healthy lifestyle: ? ?Eat at least 3 REAL meals and 1-2 snacks per day.  Aim for no more than 5 hours between eating.  If you eat breakfast, please do so within one hour of getting up.  ? ?Each meal should contain half fruits/vegetables, one quarter protein, and one quarter carbs (no bigger than a computer mouse) ? ?Cut down on sweet beverages. This includes juice, soda, and sweet tea.  ? ?Drink at least 1 glass of water with each meal and aim for at least 8 glasses per day ? ?Exercise at least 150 minutes every week.   ? ?Preventive Care 2 Years and Older, Male ?Preventive care refers to lifestyle choices and visits with your health care provider that can promote health and wellness. Preventive care visits are also called wellness exams. ?What can I expect for my preventive care visit? ?Counseling ?During your preventive care visit, your health care provider may ask about your: ?Medical history, including: ?Past medical problems. ?Family medical history. ?History of falls. ?Current health, including: ?Emotional well-being. ?Home life and relationship well-being. ?Sexual activity. ?Memory and ability to understand (cognition). ?Lifestyle, including: ?Alcohol, nicotine or tobacco, and drug use. ?Access to firearms. ?Diet, exercise, and sleep  habits. ?Work and work Statistician. ?Sunscreen use. ?Safety issues such as seatbelt and bike helmet use. ?Physical exam ?Your health care provider will check your: ?Height and weight. These may be used to calculate your BMI (body mass index). BMI is a measurement that tells if you are at a healthy weight. ?Waist circumference. This measures the distance around your waistline. This measurement also tells if you are at a healthy weight and may help predict your risk of certain diseases, such as type 2 diabetes and high blood pressure. ?Heart rate and blood pressure. ?Body temperature. ?Skin for abnormal spots. ?What immunizations do I need? ? ?Vaccines are usually given at various ages, according to a schedule. Your health care provider will recommend vaccines for you based on your age, medical history, and lifestyle or other factors, such as travel or where you work. ?What tests do I need? ?Screening ?Your health care provider may recommend screening tests for certain conditions. This may include: ?Lipid and cholesterol levels. ?Diabetes screening. This is done by checking your blood sugar (glucose) after you have not eaten for a while (fasting). ?Hepatitis C test. ?Hepatitis B test. ?HIV (human immunodeficiency virus) test. ?STI (sexually transmitted infection) testing, if you are at risk. ?Lung cancer screening. ?Colorectal cancer screening. ?Prostate cancer screening. ?Abdominal aortic aneurysm (AAA) screening. You may need this if you are a current or former smoker. ?Talk with your health care provider about your test results, treatment options, and if  necessary, the need for more tests. ?Follow these instructions at home: ?Eating and drinking ? ?Eat a diet that includes fresh fruits and vegetables, whole grains, lean protein, and low-fat dairy products. Limit your intake of foods with high amounts of sugar, saturated fats, and salt. ?Take vitamin and mineral supplements as recommended by your health care  provider. ?Do not drink alcohol if your health care provider tells you not to drink. ?If you drink alcohol: ?Limit how much you have to 0-2 drinks a day. ?Know how much alcohol is in your drink. In the [place] one drink equals one 12 oz bottle of beer (355 mL), one 5 oz glass of wine (148 mL), or one 1? oz glass of hard liquor (44 mL). ?Lifestyle ?Brush your teeth every morning and night with fluoride toothpaste. Floss one time each day. ?Exercise for at least 30 minutes 5 or more days each week. ?Do not use any products that contain nicotine or tobacco. These products include cigarettes, chewing tobacco, and vaping devices, such as e-cigarettes. If you need help quitting, ask your health care provider. ?Do not use drugs. ?If you are sexually active, practice safe sex. Use a condom or other form of protection to prevent STIs. ?Take aspirin only as told by your health care provider. Make sure that you understand how much to take and what form to take. Work with your health care provider to find out whether it is safe and beneficial for you to take aspirin daily. ?Ask your health care provider if you need to take a cholesterol-lowering medicine (statin). ?Find healthy ways to manage stress, such as: ?Meditation, yoga, or listening to music. ?Journaling. ?Talking to a trusted person. ?Spending time with friends and family. ?Safety ?Always wear your seat belt while driving or riding in a vehicle. ?Do not drive: ?If you have been drinking alcohol. Do not ride with someone who has been drinking. ?When you are tired or distracted. ?While texting. ?If you have been using any mind-altering substances or drugs. ?Wear a helmet and other protective equipment during sports activities. ?If you have firearms in your house, make sure you follow all gun safety procedures. ?Minimize exposure to UV radiation to reduce your risk of skin cancer. ?What's next? ?Visit your health care provider once a year for an annual wellness visit. ?Ask  your health care provider how often you should have your eyes and teeth checked. ?Stay up to date on all vaccines. ?This information is not intended to replace advice given to you by your health care provider. Make sure you discuss any questions you have with your health care provider. ?Document Revised: 06/26/2020 Document Reviewed: 06/26/2020 ?Elsevier Patient Education ? [place]. ? ?

## 2021-05-05 NOTE — Assessment & Plan Note (Signed)
Check vit D 

## 2021-05-07 NOTE — Progress Notes (Signed)
Please inform patient of the following: ? ?His blood sugar is borderline elevated.  Everything else is stable.  Testosterone level is normal.  Do not need to make any changes to treatment plan.  He should continue current medications and continue to work on diet and exercise.  We can recheck in a year. ? ?Mason Vargas. [name], MD ?05/07/2021 8:48 AM  ?

## 2021-06-10 ENCOUNTER — Telehealth (INDEPENDENT_AMBULATORY_CARE_PROVIDER_SITE_OTHER): Payer: Commercial Managed Care - PPO | Admitting: Family Medicine

## 2021-06-10 ENCOUNTER — Telehealth: Payer: Self-pay | Admitting: Family Medicine

## 2021-06-10 ENCOUNTER — Encounter: Payer: Self-pay | Admitting: Family Medicine

## 2021-06-10 DIAGNOSIS — U071 COVID-19: Secondary | ICD-10-CM | POA: Diagnosis not present

## 2021-06-10 MED ORDER — NIRMATRELVIR/RITONAVIR (PAXLOVID)TABLET: 3.0000 | ORAL_TABLET | Freq: Two times a day (BID) | ORAL | 0 refills | Status: AC

## 2021-06-10 NOTE — Patient Instructions (Signed)
Meds have been sent the the pharmacy You can take tylenol for pain/fevers If worsening symptoms, let us know or go to the Emergency room   Advised of CDC guidelines for self isolation/ ending isolation.  Advised of safe practice guidelines. Symptom Tier reviewed.  Encouraged to monitor for any worsening symptoms; watch for increased shortness of breath, weakness, and signs of dehydration. Advised when to seek emergency care.  Instructed to rest and hydrate well.  Advised to leave the house during recommended isolation period, only if it is necessary to seek medical care

## 2021-06-10 NOTE — Progress Notes (Signed)
MyChart Video Visit    Virtual Visit via Video Note   This visit type was conducted due to national recommendations for restrictions regarding the COVID-19 Pandemic (e.g. social distancing) in an effort to limit this patient's exposure and mitigate transmission in our community. This patient is at least at moderate risk for complications without adequate follow up. This format is felt to be most appropriate for this patient at this time. Physical exam was limited by quality of the video and audio technology used for the visit. CMA was able to get the patient set up on a video visit.  Patient location: Home. Patient and provider in visit Provider location: Office  I discussed the limitations of evaluation and management by telemedicine and the availability of in person appointments. The patient expressed understanding and agreed to proceed.  Visit Date: 06/10/2021  Today's healthcare provider: Wellington Hampshire, MD     Subjective:    Patient ID: Mason Vargas, male    DOB: 1955/06/24, 66 y.o.   MRN: [id number]  Chief Complaint  Patient presents with   Covid Positive    Sx started Sunday evening with the sore throat Tested positive on Sunday night      Headache   Sore Throat   Generalized Body Aches   Nasal Congestion   Cough    Has been taking tylenol Dry cough at times     HPI Chief complaint: sore throat. covid Symptom onset: 5/28 Pertinent positives: HA, sore throat, myalgias, congestion,intermitt fevers Pertinent negatives: no sob Treatments tried: OTC Vaccine status: had 5 vaccines Sick exposure: none   Past Medical History:  Diagnosis Date   GERD (gastroesophageal reflux disease)    on meds   Hyperlipidemia    on meds   Hypertension    on meds   Insomnia    Lumbar herniated disc    Sciatica 04/08/2018   Left leg   Snores     Past Surgical History:  Procedure Laterality Date   CLOSED REDUCTION NASAL FRACTURE N/A 05/26/2018   Procedure: CLOSED  REDUCTION NASAL FRACTURE;  Surgeon: Rozetta Nunnery, MD;  Location: [facility];  Service: ENT;  Laterality: N/A;   COLONOSCOPY W/ POLYPECTOMY  2014   LUMBAR [place]  05/13/2020   WISDOM TOOTH EXTRACTION      Outpatient Medications Prior to Visit  Medication Sig Dispense Refill   atorvastatin (LIPITOR) 20 MG tablet TAKE 1 TABLET (20 MG TOTAL) BY MOUTH AT BEDTIME. 90 tablet 3   losartan-hydrochlorothiazide (HYZAAR) 50-12.5 MG tablet Take 1 tablet by mouth daily. 90 tablet 3   metoprolol tartrate (LOPRESSOR) 25 MG tablet TAKE 1 TABLET BY MOUTH TWICE DAILY. 60 tablet 11   omeprazole (PRILOSEC) 20 MG capsule TAKE 1 CAPSULE (20 MG TOTAL) BY MOUTH DAILY. 90 capsule 3   tadalafil (CIALIS) 5 MG tablet Take 1 tablet (5 mg total) by mouth daily. 30 tablet 11   traZODone (DESYREL) 50 MG tablet Take 0.5-1 tablets (25-50 mg total) by mouth at bedtime as needed. for sleep 30 tablet 11   No facility-administered medications prior to visit.    No Known Allergies      Objective:     Physical Exam  Vitals and nursing note reviewed.  Constitutional:      General:  mod ill but non-toxic    Appearance: Normal appearance.  HENT:     Head: Normocephalic.  Pulmonary:     Effort: No respiratory  distress.  Skin:    General: Skin is dry.     Coloration: Skin is not pale.  Neurological:     Mental Status: Pt is alert and oriented to person, place, and time.  Psychiatric:        Mood and Affect: Mood normal.   There were no vitals taken for this visit.  Wt Readings from Last 3 Encounters:  05/05/21 188 lb 12.8 oz (85.6 kg)  01/31/21 188 lb (85.3 kg)  06/13/20 181 lb 9.6 oz (82.4 kg)       Assessment & Plan:   Problem List Items Addressed This Visit   None Visit Diagnoses     COVID-19    -  Primary   Relevant Medications   nirmatrelvir/ritonavir EUA (PAXLOVID) 20 x 150 MG & 10 x '100MG'$  TABS      Covid-19.  Discussed paxlovid-pt would like to  do.  Advised to hold tadalafil and atorvastatin.  Discussed quarantine, etc.   Symptomatic tx  Meds ordered this encounter  Medications   nirmatrelvir/ritonavir EUA (PAXLOVID) 20 x 150 MG & 10 x '100MG'$  TABS    Sig: Take 3 tablets by mouth 2 (two) times daily for 5 days. (Take nirmatrelvir 150 mg two tablets twice daily for 5 days and ritonavir 100 mg one tablet twice daily for 5 days) Patient GFR is 94    Dispense:  30 tablet    Refill:  0    I discussed the assessment and treatment plan with the patient. The patient was provided an opportunity to ask questions and all were answered. The patient agreed with the plan and demonstrated an understanding of the instructions.   The patient was advised to call back or seek an in-person evaluation if the symptoms worsen or if the condition fails to improve as anticipated.  I provided 15 minutes of face-to-face time during this encounter.   Wellington Hampshire, MD [place] [phone] (phone) [phone] (fax)  [place]

## 2021-06-10 NOTE — Telephone Encounter (Signed)
930am vv apt with dr Cherlynn Kaiser today 06/10/21    Patient Name: Mason Vargas Gender: Male DOB: 08/21/1955 Age: 66 Y 8 M 56 D Return Phone Number: [phone] (Primary), [phone] (Secondary) Address: City/ State/ Zip: [place] [address] Client [facility] Client [facility] Provider Dimas Chyle- MD Contact Type Call Who Is Calling Patient / Member / Family / Caregiver Call Type Triage / Clinical Relationship To Patient Self Return Phone Number [phone] (Primary) Chief Complaint Headache Reason for Call Medication Question / Request Initial Comment Caller states he tested positive for COVID on Sunday. Caller has a sore throat, headache, body aches and he would like to get Paxlovid prescribed. Translation No Nurse Assessment Nurse: Nicki Reaper, RN, Malachy Mood Date/Time (Eastern Time): 06/10/2021 8:07:30 AM Confirm and document reason for call. If symptomatic, describe symptoms. ---Caller states he tested positive for Covid on Sunday via home test, he has had 5 covid booster shots, has a dry cough, sore throat body aches HA and rates pain 6/10 has taken Tylenol,Temp running 99, CP/Pressure, SOB and other symptoms, requesting Paxlovid Does the patient have any new or worsening symptoms? ---Yes Will a triage be completed? ---Yes Related visit to physician within the last 2 weeks? ---No Does the PT have any chronic conditions? (i.e. diabetes, asthma, this includes High risk factors for pregnancy, etc.) ---Yes List chronic conditions. ---HTN Is this a behavioral health or substance abuse call? ---No Guidelines Guideline Title Affirmed Question Affirmed Notes Nurse Date/Time (Eastern Time) COVID-19 - Diagnosed or Suspected [1] HIGH RISK patient (e.g., weak immune system, age > 64 years, obesity with BMI 30 or Nicki Reaper, RN, Malachy Mood 06/10/2021 8:12:09 AM PLEASE NOTE: All timestamps contained  within this report are represented as [place] Standard Time. CONFIDENTIALTY NOTICE: This fax transmission is intended only for the addressee. It contains information that is legally privileged, confidential or otherwise protected from use or disclosure. If you are not the intended recipient, you are strictly prohibited from reviewing, disclosing, copying using or disseminating any of this information or taking any action in reliance on or regarding this information. If you have received this fax in error, please notify us immediately by telephone so that we can arrange for its return to [place]. Phone: [phone], Toll-Free: [phone], Fax: [phone] Page: 2 of 2 Call Id: [id number] Guidelines Guideline Title Affirmed Question Affirmed Notes Nurse Date/Time Eilene Ghazi Time) higher, pregnant, chronic lung disease or other chronic medical condition) AND [2] COVID symptoms (e.g., cough, fever) (Exceptions: Already seen by PCP and no new or worsening symptoms.) Disp. Time Eilene Ghazi Time) Disposition Final User 06/10/2021 8:15:24 AM Call PCP within 17 Hours Yes Nicki Reaper, RN, Erskine Speed Disagree/Comply Comply Caller Understands Yes PreDisposition Call Doctor Care Advice Given Per Guideline CALL PCP WITHIN 24 HOURS: * You need to discuss this with your doctor (or NP/PA) within the next 24 hours. * Cough: Use cough drops. FEVER MEDICINES: * For fevers above 101 F (38.3 C) take either acetaminophen or ibuprofen. FEVER MEDICINES - EXTRA NOTES AND WARNINGS: * Follow these dosing instructions unless your doctor (or NP/PA) has told you to take a different dose. CALL BACK IF: * You become worse Comments User: Burna Sis, RN Date/Time Eilene Ghazi Time): 06/10/2021 8:17:15 AM Extended hold time with office, transferred caller and instruct to let them know she has spoken to [place] and needs to be seen in next 24 hrs or have Rx sent to pharmacy for paxlovid, if  unable to be seen or get Rx go to  [facility] Referrals REFERRED TO PCP OFFICE

## 2021-06-10 NOTE — Telephone Encounter (Signed)
See note

## 2021-06-18 ENCOUNTER — Other Ambulatory Visit: Payer: Commercial Managed Care - PPO

## 2021-06-25 ENCOUNTER — Ambulatory Visit
Admission: RE | Admit: 2021-06-25 | Discharge: 2021-06-25 | Disposition: A | Discharge status: Home / Self Care | Payer: Self-pay | Source: Ambulatory Visit | Attending: Family Medicine | Admitting: Family Medicine

## 2021-06-25 DIAGNOSIS — Z0001 Encounter for general adult medical examination with abnormal findings: Secondary | ICD-10-CM

## 2021-06-28 NOTE — Progress Notes (Signed)
Please inform patient of the following:  He has a little plaque build up but this is near average for his age and gender. We do not need to make any changes to his treatment plan at this time. HE should continue to work on diet and exercise. We can recheck his scan in 5 years.

## 2021-07-02 ENCOUNTER — Encounter: Payer: Self-pay | Admitting: Family Medicine

## 2021-07-02 DIAGNOSIS — I77810 Thoracic aortic ectasia: Secondary | ICD-10-CM | POA: Insufficient documentation

## 2021-07-02 DIAGNOSIS — I77819 Aortic ectasia, unspecified site: Secondary | ICD-10-CM | POA: Insufficient documentation

## 2021-07-02 NOTE — Progress Notes (Signed)
We should repeat a CTA next year to monitor for his aneurysm and the cardiac CT scan in 5 years to monitor for calcification.

## 2021-08-17 ENCOUNTER — Other Ambulatory Visit: Payer: Self-pay | Admitting: Family Medicine

## 2021-09-22 ENCOUNTER — Encounter: Payer: Self-pay | Admitting: Family Medicine

## 2021-09-23 ENCOUNTER — Other Ambulatory Visit: Payer: Self-pay | Admitting: *Deleted

## 2021-09-23 MED ORDER — METOPROLOL TARTRATE 25 MG PO TABS: 25.0000 mg | ORAL_TABLET | Freq: Two times a day (BID) | ORAL | 1 refills | Status: DC

## 2021-09-23 MED ORDER — TADALAFIL 5 MG PO TABS: 5.0000 mg | ORAL_TABLET | Freq: Every day | ORAL | 1 refills | Status: DC

## 2021-09-23 MED ORDER — TRAZODONE HCL 50 MG PO TABS: 25.0000 mg | ORAL_TABLET | Freq: Every evening | ORAL | 1 refills | Status: DC | PRN

## 2021-09-23 NOTE — Telephone Encounter (Signed)
Prescription send to pharmacy with 90 days supply  Patient aware

## 2021-10-06 ENCOUNTER — Encounter: Payer: Self-pay | Admitting: *Deleted

## 2021-11-25 ENCOUNTER — Encounter: Payer: Self-pay | Admitting: Family Medicine

## 2021-11-25 NOTE — Telephone Encounter (Signed)
Ok to send in new rx for omeprazole '20mg'$  bid.  [name]. [name], MD 11/25/2021 12:36 PM

## 2021-11-25 NOTE — Telephone Encounter (Signed)
Please advise 

## 2021-11-27 ENCOUNTER — Other Ambulatory Visit: Payer: Self-pay

## 2021-11-27 DIAGNOSIS — K219 Gastro-esophageal reflux disease without esophagitis: Secondary | ICD-10-CM

## 2021-11-27 MED ORDER — OMEPRAZOLE 20 MG PO CPDR: 20.0000 mg | DELAYED_RELEASE_CAPSULE | Freq: Every day | ORAL | 3 refills | Status: DC

## 2022-01-01 ENCOUNTER — Telehealth (INDEPENDENT_AMBULATORY_CARE_PROVIDER_SITE_OTHER): Payer: Commercial Managed Care - PPO | Admitting: Physician Assistant

## 2022-01-01 ENCOUNTER — Encounter: Payer: Self-pay | Admitting: Physician Assistant

## 2022-01-01 VITALS — Ht 72.0 in | Wt 184.0 lb

## 2022-01-01 DIAGNOSIS — B009 Herpesviral infection, unspecified: Secondary | ICD-10-CM

## 2022-01-01 MED ORDER — VALACYCLOVIR HCL 1 G PO TABS: 1000.0000 mg | ORAL_TABLET | Freq: Two times a day (BID) | ORAL | 0 refills | Status: AC

## 2022-01-01 NOTE — Progress Notes (Signed)
I acted as a Education administrator for [employer], PA-C Anselmo Pickler, LPN  Virtual Visit via Video Note   I Mason Coke, PA , connected with  Mason Vargas  ([id number], 03-Dec-1955) on 01/01/22 at 11:20 AM EST by a video-enabled telemedicine application and verified that I am speaking with the correct person using two identifiers.  Location: Patient: Home Provider: [place] office   I discussed the limitations of evaluation and management by telemedicine and the availability of in person appointments. The patient expressed understanding and agreed to proceed.    History of Present Illness: Mason Vargas is a 66 y.o. who identifies as a male who was assigned male at birth, and is being seen today for cold sore. Pt c/o cold sore bottom lip in the middle, started 5 days ago. Has been using OTC generic topical abreva without much relief. It seem to be doing better but now woke up with tiny bump next to it.  Has had cold sore at age 66 but none since. He is under a lot of stress at work.  Problems:  Patient Active Problem List   Diagnosis Date Noted   Thoracic aortic ectasia ([place]) 07/02/2021   Insomnia 04/22/2020   Thoracic aortic aneurysm without rupture ([place]) 03/11/2020   SVT (supraventricular tachycardia) 03/11/2020   Low back pain 10/31/2019   Abnormal chest x-ray 02/24/2019   Radiculopathy, lumbar region 09/23/2018   Atrophy of quadriceps femoris muscle 09/13/2018   GERD (gastroesophageal reflux disease) 03/22/2018   Hypertension 03/22/2018   Lumbar herniated disc 03/22/2014   BPH (benign prostatic hyperplasia) 03/03/2012   Family history of ischemic heart disease (IHD) 08/10/2011   Colon adenoma 06/11/2011   Vitamin D deficiency 06/11/2011   ED (erectile dysfunction) 06/02/2011   Hyperlipidemia 03/22/2002    Allergies: No Known Allergies Medications:  Current Outpatient Medications:    losartan-hydrochlorothiazide (HYZAAR) 50-12.5 MG tablet, Take 1 tablet by mouth  daily., Disp: 90 tablet, Rfl: 3   metoprolol tartrate (LOPRESSOR) 25 MG tablet, TAKE 1 TABLET BY MOUTH TWICE DAILY., Disp: 180 tablet, Rfl: 1   omeprazole (PRILOSEC) 20 MG capsule, Take 1 capsule (20 mg total) by mouth daily., Disp: 90 capsule, Rfl: 3   tadalafil (CIALIS) 5 MG tablet, Take 1 tablet (5 mg total) by mouth daily., Disp: 90 tablet, Rfl: 1   traZODone (DESYREL) 50 MG tablet, Take 0.5-1 tablets (25-50 mg total) by mouth at bedtime as needed. for sleep, Disp: 90 tablet, Rfl: 1   valACYclovir (VALTREX) 1000 MG tablet, Take 1 tablet (1,000 mg total) by mouth 2 (two) times daily for 10 days., Disp: 20 tablet, Rfl: 0   atorvastatin (LIPITOR) 20 MG tablet, TAKE 1 TABLET (20 MG TOTAL) BY MOUTH AT BEDTIME., Disp: 90 tablet, Rfl: 3  Observations/Objective: Patient is well-developed, well-nourished in no acute distress.  Resting comfortably  at home.  Head is normocephalic, atraumatic.  Erythematous lesion to lower, central lip No labored breathing.  Speech is clear and coherent with logical content.  Patient is alert and oriented at baseline.   Assessment and Plan: 1. HSV-1 (herpes simplex virus 1) infection No red flags Start valtrex 1g BID x 10 days Follow-up prn or if concerns  Follow Up Instructions: I discussed the assessment and treatment plan with the patient. The patient was provided an opportunity to ask questions and all were answered. The patient agreed with the plan and demonstrated an understanding of the instructions.  A copy of instructions were sent to the  patient via MyChart unless otherwise noted below.   The patient was advised to call back or seek an in-person evaluation if the symptoms worsen or if the condition fails to improve as anticipated.  Mason Vargas, [place]

## 2022-01-13 ENCOUNTER — Other Ambulatory Visit: Payer: Self-pay | Admitting: Family Medicine

## 2022-01-13 DIAGNOSIS — E785 Hyperlipidemia, unspecified: Secondary | ICD-10-CM

## 2022-02-04 ENCOUNTER — Other Ambulatory Visit: Payer: Self-pay | Admitting: Family Medicine

## 2022-02-17 ENCOUNTER — Encounter: Payer: Self-pay | Admitting: Internal Medicine

## 2022-02-17 ENCOUNTER — Ambulatory Visit: Payer: BC Managed Care – PPO | Attending: Internal Medicine | Admitting: Internal Medicine

## 2022-02-17 VITALS — BP 140/82 | HR 57 | Ht 72.0 in | Wt 191.0 lb

## 2022-02-17 DIAGNOSIS — I471 Supraventricular tachycardia, unspecified: Secondary | ICD-10-CM | POA: Diagnosis not present

## 2022-02-17 DIAGNOSIS — I77819 Aortic ectasia, unspecified site: Secondary | ICD-10-CM

## 2022-02-17 DIAGNOSIS — I2584 Coronary atherosclerosis due to calcified coronary lesion: Secondary | ICD-10-CM

## 2022-02-17 DIAGNOSIS — I2510 Atherosclerotic heart disease of native coronary artery without angina pectoris: Secondary | ICD-10-CM

## 2022-02-17 DIAGNOSIS — E782 Mixed hyperlipidemia: Secondary | ICD-10-CM

## 2022-02-17 DIAGNOSIS — Z8249 Family history of ischemic heart disease and other diseases of the circulatory system: Secondary | ICD-10-CM

## 2022-02-17 DIAGNOSIS — I10 Essential (primary) hypertension: Secondary | ICD-10-CM

## 2022-02-17 MED ORDER — ASPIRIN 81 MG PO TBEC: 81.0000 mg | DELAYED_RELEASE_TABLET | Freq: Every day | ORAL | 3 refills | Status: DC

## 2022-02-17 NOTE — Patient Instructions (Addendum)
Medication Instructions:  Your physician has recommended you make the following change in your medication:  START: Aspirin 81 mg by mouth once daily  *If you need a refill on your cardiac medications before your next appointment, please call your pharmacy*   Lab Work: NONE If you have labs (blood work) drawn today and your tests are completely normal, you will receive your results only by: [place] (if you have MyChart) OR A paper copy in the mail If you have any lab test that is abnormal or we need to change your treatment, we will call you to review the results.   Testing/Procedures: Your physician has requested that you have a CT Aorta in 1 yr.   Follow-Up: At [facility], you and your health needs are our priority.  As part of our continuing mission to provide you with exceptional heart care, we have created designated Provider Care Teams.  These Care Teams include your primary Cardiologist (physician) and Advanced Practice Providers (APPs -  Physician Assistants and Nurse Practitioners) who all work together to provide you with the care you need, when you need it.  We recommend signing up for the patient portal called "MyChart".  Sign up information is provided on this After Visit Summary.  MyChart is used to connect with patients for Virtual Visits (Telemedicine).  Patients are able to view lab/test results, encounter notes, upcoming appointments, etc.  Non-urgent messages can be sent to your provider as well.   To learn more about what you can do with MyChart, go to [email or web address].    Your next appointment:   1 year(s)  Provider:   Rudean Haskell, MD

## 2022-02-17 NOTE — Progress Notes (Signed)
Cardiology Office Note:    Date:  02/17/2022   ID:  Mason Vargas, DOB [birth year], MRN [id number]  PCP:  Vivi Barrack, MD  [facility] Cardiologist:  Rudean Haskell MD [place] Electrophysiologist:  None   Referring MD: Vivi Barrack, MD   CC: Follow up for AA dilation  History of Present Illness:    Mason Vargas is a 67 y.o. male with a hx of HLD, ED, and Family history of CAD, HTN, who presents with question of arrhythmia 12/11/19.  In interim of this visit, patient had echo showing mild dilation of the ascending aorta- seen 03/11/20. 2023: had elevated CAC and Aorta is stable 41 mm  Patient notes that he is doing OK. Work is really stress echo    Since last visit notes that he is moving back to [place] from [place].  No chest pain or pressure .  No SOB/DOE and no PND/Orthopnea.  No weight gain or leg swelling.  Rare palpitations that usually improves with decreased caffeine.  Ambulatory blood pressure SBP 128-130.   Past Medical History:  Diagnosis Date   GERD (gastroesophageal reflux disease)    on meds   Hyperlipidemia    on meds   Hypertension    on meds   Insomnia    Lumbar herniated disc    Sciatica 04/08/2018   Left leg   Snores     Past Surgical History:  Procedure Laterality Date   CLOSED REDUCTION NASAL FRACTURE N/A 05/26/2018   Procedure: CLOSED REDUCTION NASAL FRACTURE;  Surgeon: Rozetta Nunnery, MD;  Location: [place];  Service: ENT;  Laterality: N/A;   COLONOSCOPY W/ POLYPECTOMY  2014   LUMBAR [place] SURGERY  05/13/2020   WISDOM TOOTH EXTRACTION      Current Medications: Current Meds  Medication Sig   aspirin EC 81 MG tablet Take 1 tablet (81 mg total) by mouth daily. Swallow whole.   atorvastatin (LIPITOR) 20 MG tablet Take 1 tablet (20 mg total) by mouth at bedtime.   losartan-hydrochlorothiazide (HYZAAR) 50-12.5 MG tablet Take 1 tablet by mouth daily.   metoprolol tartrate (LOPRESSOR) 25 MG  tablet Take 1 tablet (25 mg total) by mouth Two (2) times a day.   omeprazole (PRILOSEC) 20 MG capsule Take 1 capsule (20 mg total) by mouth daily.   tadalafil (CIALIS) 5 MG tablet Take 1 tablet (5 mg total) by mouth daily.   traMADol (ULTRAM) 50 MG tablet Take 50-100 mg by mouth every 6 (six) hours as needed for moderate pain or severe pain.   traZODone (DESYREL) 50 MG tablet Take 0.5-1 tablets (25-50 mg total) by mouth at bedtime as needed for sleep.     Allergies:   Patient has no known allergies.   Social History   Socioeconomic History   Marital status: Married    Spouse name: Not on file   Number of children: 0   Years of education: Not on file   Highest education level: Some college, no degree  Occupational History   Not on file  Tobacco Use   Smoking status: Never   Smokeless tobacco: Never  Vaping Use   Vaping Use: Never used  Substance and Sexual Activity   Alcohol use: Yes    Alcohol/week: 1.0 standard drink of alcohol    Types: 1 Standard drinks or equivalent per week    Comment: rarely   Drug use: No   Sexual activity: Yes  Birth control/protection: None  Other Topics Concern   Not on file  Social History Narrative   Right handed    Caffeine 33 -4 cups daily    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   Social Determinants of Health   Financial Resource Strain: Not on file  Food Insecurity: Not on file  Transportation Needs: Not on file  Physical Activity: Not on file  Stress: Not on file  Social Connections: Not on file    Family History: The patient's family history includes Heart Problems in his father; Heart attack in his father; Hyperlipidemia in his father; Hypertension in his father. There is no history of Colon cancer, Pancreatic cancer, Stomach cancer, Colon polyps, Esophageal cancer, or Rectal cancer. Father had ischemic cardiomyopathy and prior transplant Denies family history of sudden cardiac death including  drowning, car accidents, or unexplained deaths in the family. No history of bicuspid aortic valve or aortic aneurysm or dissection.  ROS:   Please see the history of present illness.    All other systems reviewed and are negative.  EKGs/Labs/Other Studies Reviewed:    The following studies were reviewed today:  EKG:   02/17/22: Sinus bradycardia 10/13/19 SR 88, No ST/T changes  Cardiac Studies & Procedures       ECHOCARDIOGRAM  ECHOCARDIOGRAM COMPLETE 01/03/2020  Narrative ECHOCARDIOGRAM REPORT    Patient Name:   Mason Vargas   Date of Exam: 01/03/2020 Medical Rec #:  [id number]     Height:       72.0 in Accession #:    [id number]    Weight:       180.0 lb Date of Birth:  [birth year]     BSA:          2.037 m Patient Age:    8 years      BP:           138/76 mmHg Patient Gender: M             HR:           87 bpm. Exam Location:  [place]  Procedure: 2D Echo, Cardiac Doppler and Color Doppler  Indications:    I49.3 PVC  History:        Patient has no prior history of Echocardiogram examinations. Risk Factors:Hypertension and Dyslipidemia. Low back pain.  Sonographer:    Diamond Nickel RCS Referring Phys: [id number] [facility] A Letisia Schwalb  IMPRESSIONS   1. Left ventricular ejection fraction, by estimation, is 60 to 65%. The left ventricle has normal function. The left ventricle has no regional wall motion abnormalities. There is mild concentric left ventricular hypertrophy. Left ventricular diastolic parameters are consistent with Grade I diastolic dysfunction (impaired relaxation). 2. Right ventricular systolic function is normal. The right ventricular size is normal. 3. The mitral valve is grossly normal. Mild mitral valve regurgitation. 4. The aortic valve is tricuspid. There is mild calcification of the aortic valve. There is mild thickening of the aortic valve. Aortic valve regurgitation is not visualized. 5. Aortic dilatation noted. There is mild dilatation of  the aortic root, measuring 41 mm. There is mild dilatation of the ascending aorta, measuring 39 mm. 6. The inferior vena cava is normal in size with greater than 50% respiratory variability, suggesting right atrial pressure of 3 mmHg.  Comparison(s): No prior Echocardiogram.  FINDINGS Left Ventricle: Left ventricular ejection fraction, by estimation, is 60 to 65%. The left ventricle has normal function. The  left ventricle has no regional wall motion abnormalities. The left ventricular internal cavity size was normal in size. There is mild concentric left ventricular hypertrophy. Left ventricular diastolic parameters are consistent with Grade I diastolic dysfunction (impaired relaxation).  Right Ventricle: The right ventricular size is normal. No increase in right ventricular wall thickness. Right ventricular systolic function is normal.  Left Atrium: Left atrial size was normal in size.  Right Atrium: Right atrial size was normal in size.  Pericardium: There is no evidence of pericardial effusion.  Mitral Valve: The mitral valve is grossly normal. There is mild thickening of the mitral valve leaflet(s). Mild mitral annular calcification. Mild mitral valve regurgitation.  Tricuspid Valve: The tricuspid valve is normal in structure. Tricuspid valve regurgitation is mild.  Aortic Valve: The aortic valve is tricuspid. There is mild calcification of the aortic valve. There is mild thickening of the aortic valve. Aortic valve regurgitation is not visualized.  Pulmonic Valve: The pulmonic valve was normal in structure. Pulmonic valve regurgitation is not visualized.  Aorta: Aortic dilatation noted. There is mild dilatation of the aortic root, measuring 41 mm. There is mild dilatation of the ascending aorta, measuring 39 mm.  Venous: The inferior vena cava is normal in size with greater than 50% respiratory variability, suggesting right atrial pressure of 3 mmHg.  IAS/Shunts: No atrial level  shunt detected by color flow Doppler.   LEFT VENTRICLE PLAX 2D LVIDd:         4.50 cm  Diastology LVIDs:         2.90 cm  LV e' medial:    4.79 cm/s LV PW:         1.20 cm  LV E/e' medial:  11.4 LV IVS:        1.40 cm  LV e' lateral:   6.42 cm/s LVOT diam:     2.35 cm  LV E/e' lateral: 8.5 LV SV:         82 LV SV Index:   40 LVOT Area:     4.34 cm   RIGHT VENTRICLE RV Basal diam:  2.70 cm RV S prime:     12.70 cm/s TAPSE (M-mode): 1.9 cm  LEFT ATRIUM             Index       RIGHT ATRIUM           Index LA diam:        3.30 cm 1.62 cm/m  RA Area:     13.40 cm LA Vol (A2C):   50.2 ml 24.64 ml/m RA Volume:   29.40 ml  14.43 ml/m LA Vol (A4C):   74.1 ml 36.37 ml/m LA Biplane Vol: 62.3 ml 30.58 ml/m AORTIC VALVE LVOT Vmax:   84.90 cm/s LVOT Vmean:  55.800 cm/s LVOT VTI:    0.188 m  AORTA Ao Root diam: 4.10 cm  MITRAL VALVE MV Area (PHT): 3.77 cm    SHUNTS MV Decel Time: 201 msec    Systemic VTI:  0.19 m MV E velocity: 54.40 cm/s  Systemic Diam: 2.35 cm MV A velocity: 80.10 cm/s MV E/A ratio:  0.68  Gwyndolyn Kaufman MD Electronically signed by Gwyndolyn Kaufman MD Signature Date/Time: 01/03/2020/10:55:11 AM    Final    MONITORS  LONG TERM MONITOR (3-14 DAYS) 11/12/2019  Narrative 1. The basic rhythm is normal sinus with an average HR of 81 bpm 2. No atrial fibrillation or flutter 3. No high-grade heart block or pathologic pauses 4. There are  rare PVC's and occasional supraventricular beats without sustained arrhythmias. There are rare, short supraventricular runs up to a maximum of 5 beats   CT SCANS  CT CARDIAC SCORING (SELF PAY ONLY) 06/26/2021  Addendum 06/26/2021  9:59 PM ADDENDUM REPORT: 06/26/2021 21:56  CLINICAL DATA:  Cardiovascular Disease Risk stratification  EXAM: Coronary Calcium Score  TECHNIQUE: A gated, non-contrast computed tomography scan of the heart was performed using 7m slice thickness. Axial images were analyzed on  a dedicated workstation. Calcium scoring of the coronary arteries was performed using the Agatston method.  FINDINGS: Coronary arteries: Normal origins.  Coronary Calcium Score:  Left main: 7  Left anterior descending artery: 19  Left circumflex artery: 6  Right coronary artery: 0  Total: 125  Percentile: 59  Pericardium: Normal.  Aorta: Mildly dilated caliber of ascending aorta. No aortic atherosclerosis noted.  [facility]-cardiac: [facility]  IMPRESSION: Coronary calcium score of 125. This was 59th percentile for age-, race-, and sex-matched controls. Mildly dilated caliber of ascending aorta (~41 mm).  RECOMMENDATIONS: Coronary artery calcium (CAC) score is a strong predictor of incident coronary heart disease (CHD) and provides predictive information beyond traditional risk factors. CAC scoring is reasonable to use in the decision to withhold, postpone, or initiate statin therapy in intermediate-risk or selected borderline-risk asymptomatic adults (age 66-75years and LDL-C >=70 to <190 mg/dL) who do not have diabetes or established atherosclerotic cardiovascular disease (ASCVD).* In intermediate-risk (10-year ASCVD risk >=7.5% to <20%) adults or selected borderline-risk (10-year ASCVD risk >=5% to <7.5%) adults in whom a CAC score is measured for the purpose of making a treatment decision the following recommendations have been made:  If CAC=0, it is reasonable to withhold statin therapy and reassess in 5 to 10 years, as long as higher risk conditions are absent (diabetes mellitus, family history of premature CHD in first degree relatives (males <55 years; females <65 years), cigarette smoking, or LDL >=190 mg/dL).  If CAC is 1 to 99, it is reasonable to initiate statin therapy for patients >=526years of age.  If CAC is >=100 or >=75th percentile, it is reasonable to initiate statin therapy at any age.  Cardiology referral  should be considered for patients with CAC scores >=400 or >=75th percentile.  *2018 AHA/ACC/AACVPR/AAPA/ABC/ACPM/ADA/AGS/APhA/ASPC/NLA/PCNA Guideline on the Management of Blood Cholesterol: A Report of the American College of Cardiology/American Heart Association Task Force on Clinical Practice Guidelines. J Am Coll Cardiol. 2019;73(24):3168-3209.  BBuford Dresser MD   Electronically Signed By: BBuford DresserM.D. On: 06/26/2021 21:56  Narrative EXAM: OVER-READ INTERPRETATION  CT CHEST  The following report is a limited chest CT over-read performed by radiologist Dr. DVinnie Langtonof [facility] [place] 06/25/2021. The coronary calcium score interpretation by the cardiologist is attached.  COMPARISON:  Chest CTA 08/30/2020.  FINDINGS: Innumerable small calcified granulomas are scattered throughout both lungs. Ectasia of ascending thoracic aorta (4.1 cm in diameter). Within the visualized portions of the thorax there are no suspicious appearing pulmonary nodules or masses, there is no acute consolidative airspace disease, no pleural effusions, no pneumothorax and no lymphadenopathy. Visualized portions of the upper abdomen are unremarkable. There are no aggressive appearing lytic or blastic lesions noted in the visualized portions of the skeleton.  IMPRESSION: 1. Ectasia of the ascending thoracic aorta (4.1 cm in diameter). Recommend annual imaging followup by CTA or MRA. This recommendation follows 2010 ACCF/AHA/AATS/ACR/ASA/SCA/SCAI/SIR/STS/SVM Guidelines for the Diagnosis and Management of Patients with Thoracic Aortic Disease. Circulation. 2010; 121:: Y174-B449  Aortic aneurysm NOS (ICD10-I71.9). 2. Old granulomatous disease.  Electronically Signed: By: Vinnie Langton M.D. On: 06/25/2021 09:14            Recent Labs: 05/05/2021: ALT 43; BUN 16; Creatinine, Ser 0.76; Hemoglobin 14.1; Platelets 155.0; Potassium 3.9; Sodium 138; TSH  2.44  Recent Lipid Panel    Component Value Date/Time   CHOL 106 05/05/2021 1002   TRIG 93.0 05/05/2021 1002   HDL 36.10 (L) 05/05/2021 1002   CHOLHDL 3 05/05/2021 1002   VLDL 18.6 05/05/2021 1002   LDLCALC 51 05/05/2021 1002    Physical Exam:    VS:  BP (!) 140/82   Pulse (!) 57   Ht 6' (1.829 m)   Wt 191 lb (86.6 kg)   SpO2 98%   BMI 25.90 kg/m     Wt Readings from Last 3 Encounters:  02/17/22 191 lb (86.6 kg)  01/01/22 184 lb (83.5 kg)  05/05/21 188 lb 12.8 oz (85.6 kg)    GEN: Well nourished, well developed in no acute distress HEENT: Normal NECK: No JVD; No carotid bruits LYMPHATICS: No lymphadenopathy CARDIAC: RRR, no murmurs, rubs, gallops RESPIRATORY:  Clear to auscultation without rales, wheezing or rhonchi  ABDOMEN: Soft, non-tender, non-distended MUSCULOSKELETAL:  No edema; No deformity  SKIN: Warm and dry NEUROLOGIC:  Alert and oriented x 3 PSYCHIATRIC:  Normal affect   ASSESSMENT:    1. Aortic dilatation (HCC)   2. Coronary artery calcification   3. SVT (supraventricular tachycardia)   4. Primary hypertension   5. Mixed hyperlipidemia     PLAN:    Mild aortic dilation -41 mm root unchanged - low risk when indexed to cross sectional area (7.12) and Height (1.94) - CT Aorta in one year; if still 41 mm would space to every two years given indexed size - no hx of dissection, no bicuspd, low GHENT score, no NSTHAD  HTN - Controlled on HYZAAR - if amb SBP is 120 mm Hg; he has an acute stressor that will end tomorrow; if amb BP elevated he will reach out  PVCs P-SVT - low dose BB  CAC and HLD - reviewed CT - continue statin LDL goal < 70 - no plans to repeat testing  One year with me   Medication Adjustments/Labs and Tests Ordered: Current medicines are reviewed at length with the patient today.  Concerns regarding medicines are outlined above.  Orders Placed This Encounter  Procedures   CT ANGIO CHEST AORTA W/CM & OR WO/CM   EKG  12-Lead   Meds ordered this encounter  Medications   aspirin EC 81 MG tablet    Sig: Take 1 tablet (81 mg total) by mouth daily. Swallow whole.    Dispense:  90 tablet    Refill:  3    Patient Instructions  Medication Instructions:  Your physician has recommended you make the following change in your medication:  START: Aspirin 81 mg by mouth once daily  *If you need a refill on your cardiac medications before your next appointment, please call your pharmacy*   Lab Work: NONE If you have labs (blood work) drawn today and your tests are completely normal, you will receive your results only by: [place] (if you have MyChart) OR A paper copy in the mail If you have any lab test that is abnormal or we need to change your treatment, we will call you to review the results.   Testing/Procedures: Your physician has requested that you have a CT  Aorta in 1 yr.   Follow-Up: At [facility], you and your health needs are our priority.  As part of our continuing mission to provide you with exceptional heart care, we have created designated Provider Care Teams.  These Care Teams include your primary Cardiologist (physician) and Advanced Practice Providers (APPs -  Physician Assistants and Nurse Practitioners) who all work together to provide you with the care you need, when you need it.  We recommend signing up for the patient portal called "MyChart".  Sign up information is provided on this After Visit Summary.  MyChart is used to connect with patients for Virtual Visits (Telemedicine).  Patients are able to view lab/test results, encounter notes, upcoming appointments, etc.  Non-urgent messages can be sent to your provider as well.   To learn more about what you can do with MyChart, go to [email or web address].    Your next appointment:   1 year(s)  Provider:   Rudean Haskell, MD    Signed, Werner Lean, MD  02/17/2022 5:58 PM    [place]

## 2022-03-03 ENCOUNTER — Other Ambulatory Visit: Payer: Self-pay | Admitting: Family Medicine

## 2022-04-22 ENCOUNTER — Encounter: Payer: Self-pay | Admitting: Family Medicine

## 2022-05-20 ENCOUNTER — Encounter: Payer: Self-pay | Admitting: Physical Therapy

## 2022-05-20 ENCOUNTER — Ambulatory Visit: Payer: BC Managed Care – PPO | Attending: Family Medicine | Admitting: Physical Therapy

## 2022-05-20 DIAGNOSIS — R209 Unspecified disturbances of skin sensation: Secondary | ICD-10-CM | POA: Insufficient documentation

## 2022-05-20 DIAGNOSIS — M25651 Stiffness of right hip, not elsewhere classified: Secondary | ICD-10-CM | POA: Diagnosis present

## 2022-05-20 DIAGNOSIS — M5459 Other low back pain: Secondary | ICD-10-CM | POA: Insufficient documentation

## 2022-05-20 DIAGNOSIS — M25652 Stiffness of left hip, not elsewhere classified: Secondary | ICD-10-CM | POA: Insufficient documentation

## 2022-05-20 NOTE — Therapy (Signed)
OUTPATIENT PHYSICAL THERAPY THORACOLUMBAR EVALUATION   Patient Name: Mason Vargas MRN: [id number] DOB:[birth year], 67 y.o., male Today's Date: 05/20/2022  END OF SESSION:  PT End of Session - 05/20/22 0744     Visit Number 1    Number of Visits 16    Date for PT Re-Evaluation 07/15/22    Authorization Type BCBS    PT Start Time 0800    PT Stop Time 0850    PT Time Calculation (min) 50 min    Activity Tolerance Patient tolerated treatment well    Behavior During Therapy [facility] for tasks assessed/performed             Past Medical History:  Diagnosis Date   GERD (gastroesophageal reflux disease)    on meds   Hyperlipidemia    on meds   Hypertension    on meds   Insomnia    Lumbar herniated disc    Sciatica 04/08/2018   Left leg   Snores    Past Surgical History:  Procedure Laterality Date   CLOSED REDUCTION NASAL FRACTURE N/A 05/26/2018   Procedure: CLOSED REDUCTION NASAL FRACTURE;  Surgeon: Drema Halon, MD;  Location: [facility];  Service: ENT;  Laterality: N/A;   COLONOSCOPY W/ POLYPECTOMY  2014   LUMBAR DISC SURGERY     SPINE SURGERY  05/13/2020   WISDOM TOOTH EXTRACTION     Patient Active Problem List   Diagnosis Date Noted   Aortic dilatation (HCC) 07/02/2021   Insomnia 04/22/2020   SVT (supraventricular tachycardia) 03/11/2020   Low back pain 10/31/2019   Abnormal chest x-ray 02/24/2019   Radiculopathy, lumbar region 09/23/2018   Atrophy of quadriceps femoris muscle 09/13/2018   GERD (gastroesophageal reflux disease) 03/22/2018   Hypertension 03/22/2018   Lumbar herniated disc 03/22/2014   BPH (benign prostatic hyperplasia) 03/03/2012   Family history of ischemic heart disease (IHD) 08/10/2011   Colon adenoma 06/11/2011   Vitamin D deficiency 06/11/2011   ED (erectile dysfunction) 06/02/2011   Hyperlipidemia 03/22/2002    PCP: Jacquiline Doe MD   REFERRING PROVIDER: Tressie Stalker, MD  REFERRING DIAG: M51.16 (ICD-10-CM) -  Intervertebral disc disorders with radiculopathy, lumbar region  Rationale for Evaluation and Treatment: Rehabilitation  THERAPY DIAG:  Other low back pain  Stiffness of right hip, not elsewhere classified  Stiffness of left hip, not elsewhere classified  Sensory disturbance  ONSET DATE: chronic   SUBJECTIVE:  SUBJECTIVE STATEMENT: Patient has a long history of low back pain.  He underwent L3-L4 discectomy in 2021.  He did physical therapy here at this clinic time he said he could barely walk but he did improve and was feeling better.  Sometime after that he was shoveling and hurt his back ultimately had to have discectomy #2 done on L5-S1 level.  Both surgeries were performed by Dr. Lovell Sheehan.  He continues to have left-sided low back pain with periods of sitting and walking.  His LLE is numb below and above the knee. Knee will occasionally buckle.  He denies foot drop.  He limits the amount of lifting he does as ordered by his doctor but he also will feel pain after only 10 he is unable to walk more than a mile due to pain "I feel like there is a fist in my back constantly" The pain is sharp if I turn the wrong way.   He would like to find a better chair for his desk presents several hours per day sitting for work.  He does walk when he is on the job site as well as climb stairs at time.  He does not exercise but he reports he used to run with wife 3 x 5 K a week. He has a stationary bike but he admits he is a bit fearful regarding any kind of exercise .    PERTINENT HISTORY:  See above Cardiac history   PAIN:  Are you having pain? Yes: NPRS scale: 2/10 Pain location: L side L5  Pain description: sore  Aggravating factors: sitting  Relieving factors: MHP, Aleve/meds, Lying supine  End of the day,  after sitting pain can be 6/10-9/10  PRECAUTIONS: None and Other: arrythmia -caffeine related   WEIGHT BEARING RESTRICTIONS: No  FALLS:  Has patient fallen in last 6 months? No  LIVING ENVIRONMENT: Lives with: lives with their family wife and MIL  Lives in: House/apartment  Stairs: Yes: Internal: 15 steps; on right going up Has following equipment at home: None ramp in garage, Mining engineer if needed   OCCUPATION: consulting   PLOF: Independent  PATIENT GOALS: I want to be able to increase activity and decrease pain . Remove the fear factor.   NEXT MD VISIT: Dr. [name] next week. Dr. Lovell Sheehan - 2 weeks   OBJECTIVE:   DIAGNOSTIC FINDINGS:  XR done Dr. [name] -normal   PATIENT SURVEYS:  FOTO 46%  SCREENING FOR RED FLAGS: Bowel or bladder incontinence: No Spinal tumors: No Cauda equina syndrome: No Compression fracture: No Abdominal aneurysm: No  COGNITION: Overall cognitive status: Within functional limits for tasks assessed     SENSATION: WFL  MUSCLE LENGTH: Hamstrings: 90/90 Right 30+ deg; Left 40+ deg [name] test: Right tight  deg; Left tighter deg  POSTURE: rounded shoulders, forward head, and decreased lumbar lordosis  PALPATION: Pain in bilateral lumbar region left side >  right.  Palpation to right L4-5 and superior glutes increased pain on the left side.   Spasm left piriformis Leg LUMBAR ROM:   AROM eval  Flexion 25% limited   Extension 50% limited  Right lateral flexion WFL  Left lateral flexion WFL  Right rotation 25% limited   Left rotation 25% limited    (Blank rows = not tested)  LOWER EXTREMITY ROM:     PROM  Right eval Left eval  Hip flexion WNL  WNL  Hip extension    Hip abduction    Hip adduction  Hip internal rotation 45 deg  20-25 deg   Hip external rotation Min restriction Min restriction   Knee flexion WNL WNL  Knee extension WNL  WNL  Ankle dorsiflexion    Ankle plantarflexion    Ankle inversion    Ankle eversion      (Blank rows = not tested)  LOWER EXTREMITY MMT:    MMT Right eval Left eval  Hip flexion 5/5 4/5  Hip extension    Hip abduction 4+/5 4/5  Hip adduction    Hip internal rotation    Hip external rotation    Knee flexion 4+/5 4/5  Knee extension 5/5 4/5  Ankle dorsiflexion 5/5 5/5  Ankle plantarflexion NT NT  Ankle inversion    Ankle eversion     (Blank rows = not tested)  LUMBAR SPECIAL TESTS:  Straight leg raise test: Negative and Slump test: Positive  FUNCTIONAL TESTS:  5 times sit to stand: NT  GAIT: Distance walked: 150 Assistive device utilized: None Level of assistance: Complete Independence Comments: min limp, favoring L side, shortened stance time on L side.   TODAY'S TREATMENT:                                                                                                                              DATE: 05/20/22    PATIENT EDUCATION:  Education details: PT, POC, HEP  and core  Person educated: Patient Education method: Explanation, Demonstration, and Handouts Education comprehension: verbalized understanding, returned demonstration, and needs further education  HOME EXERCISE PROGRAM: Access Code: [email or web address] URL: [email or web address] Date: 05/20/2022 Prepared by: Karie Mainland  Exercises - Supine Single Knee to Chest Stretch  - 1-2 x daily - 7 x weekly - 1 sets - 3-5 reps - 30 hold - Seated Figure 4 Piriformis Stretch  - 1-2 x daily - 7 x weekly - 1 sets - 3-5 reps - 30 hold - Supine Lower Trunk Rotation  - 1-2 x daily - 7 x weekly - 1 sets - 10 reps - 10 hold - Supine 90/90 Abdominal Bracing  - 1 x daily - 7 x weekly - 1 sets - 3-5 reps - 30 hold  ASSESSMENT:  CLINICAL IMPRESSION: Patient is a 67 y.o. male who was seen today for physical therapy evaluation and treatment for low back pain following discectomy x 2.   OBJECTIVE IMPAIRMENTS: Abnormal gait, decreased mobility, difficulty walking, decreased ROM, decreased strength,  increased fascial restrictions, impaired flexibility, and pain.   ACTIVITY LIMITATIONS: lifting, bending, sitting, standing, squatting, sleeping, stairs, and locomotion level  PARTICIPATION LIMITATIONS: interpersonal relationship, driving, shopping, community activity, occupation, and yard work  PERSONAL FACTORS: Past/current experiences, Time since onset of injury/illness/exacerbation, and 1-2 comorbidities: Previous back surgery,  are also affecting patient's functional outcome.   REHAB POTENTIAL: Excellent  CLINICAL DECISION MAKING: Stable/uncomplicated  EVALUATION COMPLEXITY: Low   GOALS: Goals reviewed with patient? Yes  SHORT TERM GOALS: Target date: 06/17/2022    Pt will  be I with HEP for core and hip flexibility  Baseline: Goal status: INITIAL  2.  Pt will be able to acquire better seating, positioning to improve comfort at work  Baseline:  Goal status: INITIAL  3.  Pt will be able to understand posture and hinging to preserve back with lifting and ADLs.  Baseline:  Goal status: INITIAL  4.  Patient will complete functional testing (for balance, strength, dynamic gait) and set goal Baseline:  Goal status: INITIAL    LONG TERM GOALS: Target date: 07/15/2022    Patient will improve Foto score to 56% or greater to demonstrate improved functional ability upon discharge Baseline: 46% Goal status: INITIAL  2.  Patient will be able to walk a mile without increasing back pain Baseline:  Goal status: INITIAL  3.  Patient will be able to sit as long as he needs to for work meetings up to 4 hours with min increase in back pain Baseline:  Goal status: INITIAL  4.  Patient will safely squat and/or deadlift 25 pounds without increased back pain Baseline:  Goal status: INITIAL  5.  Patient will be independent with home exercise program upon discharge Baseline:  Goal status: INITIAL  6.  Further functional goals to be assessed Baseline:  Goal status:  INITIAL  PLAN:  PT FREQUENCY: 2x/week  PT DURATION: 8 weeks  PLANNED INTERVENTIONS: Therapeutic exercises, Therapeutic activity, Neuromuscular re-education, Balance training, Gait training, Patient/Family education, Self Care, Joint mobilization, Spinal mobilization, Cryotherapy, Moist heat, Manual therapy, and Re-evaluation.Pilates   PLAN FOR NEXT SESSION: Recumbent bike, check HEP, hip hinging and core stabilization.  Consider Pilates.  Karie Mainland, PT 05/20/22 2:06 PM Phone: [phone] Fax: [phone]  Laiylah Roettger, PT 05/20/2022, 2:06 PM

## 2022-05-21 ENCOUNTER — Ambulatory Visit: Payer: BC Managed Care – PPO | Admitting: Physical Therapy

## 2022-05-21 ENCOUNTER — Encounter: Payer: Self-pay | Admitting: Physical Therapy

## 2022-05-21 DIAGNOSIS — M25651 Stiffness of right hip, not elsewhere classified: Secondary | ICD-10-CM

## 2022-05-21 DIAGNOSIS — M5459 Other low back pain: Secondary | ICD-10-CM | POA: Diagnosis not present

## 2022-05-21 DIAGNOSIS — M25652 Stiffness of left hip, not elsewhere classified: Secondary | ICD-10-CM

## 2022-05-21 NOTE — Therapy (Signed)
OUTPATIENT PHYSICAL THERAPY TREATMENT NOTE   Patient Name: Mason Vargas MRN: [id number] DOB:[birth year], 67 y.o., male Today's Date: 05/21/2022  PCP: Jacquiline Doe MD    REFERRING PROVIDER: Tressie Stalker, MD  END OF SESSION:   PT End of Session - 05/21/22 0719     Visit Number 2    Number of Visits 16    Date for PT Re-Evaluation 07/15/22    Authorization Type BCBS    PT Start Time 0717    PT Stop Time 0800    PT Time Calculation (min) 43 min             Past Medical History:  Diagnosis Date   GERD (gastroesophageal reflux disease)    on meds   Hyperlipidemia    on meds   Hypertension    on meds   Insomnia    Lumbar herniated disc    Sciatica 04/08/2018   Left leg   Snores    Past Surgical History:  Procedure Laterality Date   CLOSED REDUCTION NASAL FRACTURE N/A 05/26/2018   Procedure: CLOSED REDUCTION NASAL FRACTURE;  Surgeon: Drema Halon, MD;  Location: [facility];  Service: ENT;  Laterality: N/A;   COLONOSCOPY W/ POLYPECTOMY  2014   LUMBAR DISC SURGERY     SPINE SURGERY  05/13/2020   WISDOM TOOTH EXTRACTION     Patient Active Problem List   Diagnosis Date Noted   Aortic dilatation (HCC) 07/02/2021   Insomnia 04/22/2020   SVT (supraventricular tachycardia) 03/11/2020   Low back pain 10/31/2019   Abnormal chest x-ray 02/24/2019   Radiculopathy, lumbar region 09/23/2018   Atrophy of quadriceps femoris muscle 09/13/2018   GERD (gastroesophageal reflux disease) 03/22/2018   Hypertension 03/22/2018   Lumbar herniated disc 03/22/2014   BPH (benign prostatic hyperplasia) 03/03/2012   Family history of ischemic heart disease (IHD) 08/10/2011   Colon adenoma 06/11/2011   Vitamin D deficiency 06/11/2011   ED (erectile dysfunction) 06/02/2011   Hyperlipidemia 03/22/2002    REFERRING DIAG: M51.16 (ICD-10-CM) - Intervertebral disc disorders with radiculopathy, lumbar region  THERAPY DIAG:  Other low back pain  Stiffness of right  hip, not elsewhere classified  Stiffness of left hip, not elsewhere classified  Rationale for Evaluation and Treatment Rehabilitation  PERTINENT HISTORY:  See above Cardiac history   PRECAUTIONS: None and Other: arrythmia -caffeine related   SUBJECTIVE:                                                                                                                                                                                      SUBJECTIVE STATEMENT:  I have been looking at different chairs.  I have to sit a lot and run [employer]    PAIN:  Are you having pain? Yes: NPRS scale: 1/10 Pain location: L side L5  Pain description: sore  Aggravating factors: sitting  Relieving factors: MHP, Aleve/meds, Lying supine  End of the day, after sitting pain can be 6/10-9/10   OBJECTIVE: (objective measures completed at initial evaluation unless otherwise dated)   DIAGNOSTIC FINDINGS:  XR done Dr. [name] -normal    PATIENT SURVEYS:  FOTO 46%   SCREENING FOR RED FLAGS: Bowel or bladder incontinence: No Spinal tumors: No Cauda equina syndrome: No Compression fracture: No Abdominal aneurysm: No   COGNITION: Overall cognitive status: Within functional limits for tasks assessed                          SENSATION: WFL   MUSCLE LENGTH: Hamstrings: 90/90 Right 30+ deg; Left 40+ deg [name] test: Right tight  deg; Left tighter deg   POSTURE: rounded shoulders, forward head, and decreased lumbar lordosis   PALPATION: Pain in bilateral lumbar region left side >  right.  Palpation to right L4-5 and superior glutes increased pain on the left side.   Spasm left piriformis Leg LUMBAR ROM:    AROM eval  Flexion 25% limited   Extension 50% limited  Right lateral flexion WFL  Left lateral flexion WFL  Right rotation 25% limited   Left rotation 25% limited    (Blank rows = not tested)   LOWER EXTREMITY ROM:      PROM  Right eval Left eval  Hip flexion WNL  WNL  Hip  extension      Hip abduction      Hip adduction      Hip internal rotation 45 deg  20-25 deg   Hip external rotation Min restriction Min restriction   Knee flexion WNL WNL  Knee extension WNL  WNL  Ankle dorsiflexion      Ankle plantarflexion      Ankle inversion      Ankle eversion       (Blank rows = not tested)   LOWER EXTREMITY MMT:     MMT Right eval Left eval  Hip flexion 5/5 4/5  Hip extension      Hip abduction 4+/5 4/5  Hip adduction      Hip internal rotation      Hip external rotation      Knee flexion 4+/5 4/5  Knee extension 5/5 4/5  Ankle dorsiflexion 5/5 5/5  Ankle plantarflexion NT NT  Ankle inversion      Ankle eversion       (Blank rows = not tested)   LUMBAR SPECIAL TESTS:  Straight leg raise test: Negative and Slump test: Positive   FUNCTIONAL TESTS:  5 times sit to stand: NT   GAIT: Distance walked: 150 Assistive device utilized: None Level of assistance: Complete Independence Comments: min limp, favoring L side, shortened stance time on L side.    TODAY'S TREATMENT:  [facility] Adult PT Treatment:                                                DATE: 05/21/22 Therapeutic Exercise: Nustep L5 UE/LE x 5 minutes  Seated figure 4 push and pull  SKTC LTR 90/90- increased back pain SLR with ab brace x 10 SLR (ER) with ab brace x 10  Hamstring stretch supine with strap  Bridge x 10 Left/right  hip flexor stretch EOM Seated option for hamstring stretch     DATE: 05/20/22  EVAL     PATIENT EDUCATION:  Education details: PT, POC, HEP  and core  Person educated: Patient Education method: Programmer, multimedia, Demonstration, and Handouts Education comprehension: verbalized understanding, returned demonstration, and needs further education   HOME EXERCISE PROGRAM: Access Code: [email or web address] URL: [email or web address] Date:  05/20/2022 Prepared by: Karie Mainland   Exercises - Supine Single Knee to Chest Stretch  - 1-2 x daily - 7 x weekly - 1 sets - 3-5 reps - 30 hold - Seated Figure 4 Piriformis Stretch  - 1-2 x daily - 7 x weekly - 1 sets - 3-5 reps - 30 hold - Supine Lower Trunk Rotation  - 1-2 x daily - 7 x weekly - 1 sets - 10 reps - 10 hold - Supine 90/90 Abdominal Bracing  - 1 x daily - 7 x weekly - 1 sets - 3-5 reps - 30 hold 05/21/22 - Active straight leg raise  - 1 x daily - 7 x weekly - 1-2 sets - 10 reps - 3 hold - Supine Bridge  - 1 x daily - 7 x weekly - 1-2 sets - 10 reps - 5 hold - Seated Hamstring Stretch  - 1 x daily - 7 x weekly - 1 sets - 3 reps - 20-30 hold    ASSESSMENT:   CLINICAL IMPRESSION: Patient is a 67 y.o. male who was seen today for physical therapy treatment for low back pain following discectomy x 2. He has not had a chance to try HEP as he was just evaluated yesterday. Reviewed HEP and progressed with core and LE strength. Given options for seated vs supine hamstring stretches. He had increased back pain with 90/90 bracing so modified to SLR with ab brace which was challenging without pain. Updated HEP.    OBJECTIVE IMPAIRMENTS: Abnormal gait, decreased mobility, difficulty walking, decreased ROM, decreased strength, increased fascial restrictions, impaired flexibility, and pain.    ACTIVITY LIMITATIONS: lifting, bending, sitting, standing, squatting, sleeping, stairs, and locomotion level   PARTICIPATION LIMITATIONS: interpersonal relationship, driving, shopping, community activity, occupation, and yard work   PERSONAL FACTORS: Past/current experiences, Time since onset of injury/illness/exacerbation, and 1-2 comorbidities: Previous back surgery,  are also affecting patient's functional outcome.    REHAB POTENTIAL: Excellent   CLINICAL DECISION MAKING: Stable/uncomplicated   EVALUATION COMPLEXITY: Low     GOALS: Goals reviewed with patient? Yes   SHORT TERM GOALS:  Target date: 06/17/2022     Pt will be I with HEP for core and hip flexibility  Baseline: Goal status: INITIAL   2.  Pt will be able to acquire better seating, positioning to improve comfort at work  Baseline:  Goal status: INITIAL   3.  Pt will be able to understand posture and hinging to preserve back with lifting and ADLs.  Baseline:  Goal status: INITIAL  4.  Patient will complete functional testing (for balance, strength, dynamic gait) and set goal Baseline:  Goal status: INITIAL       LONG TERM GOALS: Target date: 07/15/2022       Patient will improve Foto score to 56% or greater to demonstrate improved functional ability upon discharge Baseline: 46% Goal status: INITIAL   2.  Patient will be able to walk a mile without increasing back pain Baseline:  Goal status: INITIAL   3.  Patient will be able to sit as long as he needs to for work meetings up to 4 hours with min increase in back pain Baseline:  Goal status: INITIAL   4.  Patient will safely squat and/or deadlift 25 pounds without increased back pain Baseline:  Goal status: INITIAL   5.  Patient will be independent with home exercise program upon discharge Baseline:  Goal status: INITIAL   6.  Further functional goals to be assessed Baseline:  Goal status: INITIAL   PLAN:   PT FREQUENCY: 2x/week   PT DURATION: 8 weeks   PLANNED INTERVENTIONS: Therapeutic exercises, Therapeutic activity, Neuromuscular re-education, Balance training, Gait training, Patient/Family education, Self Care, Joint mobilization, Spinal mobilization, Cryotherapy, Moist heat, Manual therapy, and Re-evaluation.Pilates    PLAN FOR NEXT SESSION: Recumbent bike, check HEP, hip hinging and core stabilization.  Consider Pilates.     Jannette Spanner, PTA 05/21/22 8:10 AM Phone: [phone] Fax: [phone]

## 2022-06-01 NOTE — Therapy (Unsigned)
OUTPATIENT PHYSICAL THERAPY TREATMENT NOTE   Patient Name: Mason Vargas MRN: [id number] DOB:11-24-1955, 67 y.o., male Today's Date: 06/02/2022  PCP: Mason Doe MD    REFERRING PROVIDER: Tressie Stalker, MD  END OF SESSION:   PT End of Session - 06/02/22 0754     Visit Number 3    Number of Visits 16    Date for PT Re-Evaluation 07/15/22    Authorization Type BCBS    PT Start Time 0800    PT Stop Time 0847    PT Time Calculation (min) 47 min    Activity Tolerance Patient tolerated treatment well    Behavior During Therapy [facility] for tasks assessed/performed              Past Medical History:  Diagnosis Date   GERD (gastroesophageal reflux disease)    on meds   Hyperlipidemia    on meds   Hypertension    on meds   Insomnia    Lumbar herniated disc    Sciatica 04/08/2018   Left leg   Snores    Past Surgical History:  Procedure Laterality Date   CLOSED REDUCTION NASAL FRACTURE N/A 05/26/2018   Procedure: CLOSED REDUCTION NASAL FRACTURE;  Surgeon: Mason Halon, MD;  Location: [facility];  Service: ENT;  Laterality: N/A;   COLONOSCOPY W/ POLYPECTOMY  2014   LUMBAR DISC SURGERY     SPINE SURGERY  05/13/2020   WISDOM TOOTH EXTRACTION     Patient Active Problem List   Diagnosis Date Noted   Aortic dilatation (HCC) 07/02/2021   Insomnia 04/22/2020   SVT (supraventricular tachycardia) 03/11/2020   Low back pain 10/31/2019   Abnormal chest x-ray 02/24/2019   Radiculopathy, lumbar region 09/23/2018   Atrophy of quadriceps femoris muscle 09/13/2018   GERD (gastroesophageal reflux disease) 03/22/2018   Hypertension 03/22/2018   Lumbar herniated disc 03/22/2014   BPH (benign prostatic hyperplasia) 03/03/2012   Family history of ischemic heart disease (IHD) 08/10/2011   Colon adenoma 06/11/2011   Vitamin D deficiency 06/11/2011   ED (erectile dysfunction) 06/02/2011   Hyperlipidemia 03/22/2002    REFERRING DIAG: M51.16 (ICD-10-CM) -  Intervertebral disc disorders with radiculopathy, lumbar region  THERAPY DIAG:  Other low back pain  Stiffness of right hip, not elsewhere classified  Stiffness of left hip, not elsewhere classified  Sensory disturbance  Rationale for Evaluation and Treatment Rehabilitation  PERTINENT HISTORY:  See above Cardiac history   PRECAUTIONS: None and Other: arrythmia -caffeine related   SUBJECTIVE:  SUBJECTIVE STATEMENT:   I hurt myself pretty bad on Mother's Day. Was lifting a trash can on the deck and felt immediate pain.  Not heavy just awkward lift.   The pain was pretty bad, had to take it easy for a few days.    PAIN:  Are you having pain? Yes: NPRS scale: 2/10 Pain location: L side L5  Pain description: sore  Aggravating factors: sitting  Relieving factors: MHP, Aleve/meds, Lying supine  End of the day, after sitting pain can be 6/10-9/10   OBJECTIVE: (objective measures completed at initial evaluation unless otherwise dated)   DIAGNOSTIC FINDINGS:  XR done Dr. Jimmey Vargas -normal    PATIENT SURVEYS:  FOTO 46%   SCREENING FOR RED FLAGS: Bowel or bladder incontinence: No Spinal tumors: No Cauda equina syndrome: No Compression fracture: No Abdominal aneurysm: No   COGNITION: Overall cognitive status: Within functional limits for tasks assessed                          SENSATION: WFL   MUSCLE LENGTH: Hamstrings: 90/90 Right 30+ deg; Left 40+ deg Mason Vargas test: Right tight  deg; Left tighter deg   POSTURE: rounded shoulders, forward head, and decreased lumbar lordosis   PALPATION: Pain in bilateral lumbar region left side >  right.  Palpation to right L4-5 and superior glutes increased pain on the left side.   Spasm left piriformis Leg LUMBAR ROM:    AROM eval  Flexion 25% limited    Extension 50% limited  Right lateral flexion WFL  Left lateral flexion WFL  Right rotation 25% limited   Left rotation 25% limited    (Blank rows = not tested)   LOWER EXTREMITY ROM:      PROM  Right eval Left eval  Hip flexion WNL  WNL  Hip extension      Hip abduction      Hip adduction      Hip internal rotation 45 deg  20-25 deg   Hip external rotation Min restriction Min restriction   Knee flexion WNL WNL  Knee extension WNL  WNL  Ankle dorsiflexion      Ankle plantarflexion      Ankle inversion      Ankle eversion       (Blank rows = not tested)   LOWER EXTREMITY MMT:     MMT Right eval Left eval  Hip flexion 5/5 4/5  Hip extension      Hip abduction 4+/5 4/5  Hip adduction      Hip internal rotation      Hip external rotation      Knee flexion 4+/5 4/5  Knee extension 5/5 4/5  Ankle dorsiflexion 5/5 5/5  Ankle plantarflexion NT NT  Ankle inversion      Ankle eversion       (Blank rows = not tested)   LUMBAR SPECIAL TESTS:  Straight leg raise test: Negative and Slump test: Positive   FUNCTIONAL TESTS:  5 times sit to stand: NT   GAIT: Distance walked: 150 Assistive device utilized: None Level of assistance: Complete Independence Comments: min limp, favoring L side, shortened stance time on L side.    TODAY'S TREATMENT:                  [facility] Adult PT Treatment:  DATE: 06/02/22 Therapeutic Exercise: Recumbent bike L3 for 5 min  HEP check: knee to chest , knee to opp shoulder, LTR , supine figure 4 (better in sitting)  Bridging x 10 , added exhale to lift for abdominal support  Ball under pelvis for A/P tilting and awareness Neutral spine Tr A x 10 activation  Lumbar stab I : march, clam and SLR  Sit to stand hinge with yardstick cues Quadruped hinge on Reformer   Reformer exercises: Quadruped 1 red then 1 blue x 10 each (plank on knees) Bird dog x  8  Supine arm Arcs 1 red 1 Yellow x 10   Footwork 2 red 1 blue parallel heels, turnout about x 15 each  Heel raises and calf stretch 2 red 1 blue                                                                                                               [facility] Adult PT Treatment:                                                DATE: 05/21/22 Therapeutic Exercise: Nustep L5 UE/LE x 5 minutes  Seated figure 4 push and pull  SKTC LTR 90/90- increased back pain SLR with ab brace x 10 SLR (ER) with ab brace x 10  Hamstring stretch supine with strap  Bridge x 10 Left/right  hip flexor stretch EOM Seated option for hamstring stretch     DATE: 05/20/22  EVAL     PATIENT EDUCATION:  Education details: PT, POC, HEP  and core  Person educated: Patient Education method: Programmer, multimedia, Demonstration, and Handouts Education comprehension: verbalized understanding, returned demonstration, and needs further education   HOME EXERCISE PROGRAM: Access Code: [email or web address] URL: [email or web address] Date: 05/20/2022 Prepared by: Karie Mainland   Exercises - Supine Single Knee to Chest Stretch  - 1-2 x daily - 7 x weekly - 1 sets - 3-5 reps - 30 hold - Seated Figure 4 Piriformis Stretch  - 1-2 x daily - 7 x weekly - 1 sets - 3-5 reps - 30 hold - Supine Lower Trunk Rotation  - 1-2 x daily - 7 x weekly - 1 sets - 10 reps - 10 hold - Supine 90/90 Abdominal Bracing  - 1 x daily - 7 x weekly - 1 sets - 3-5 reps - 30 hold 05/21/22 - Active straight leg raise  - 1 x daily - 7 x weekly - 1-2 sets - 10 reps - 3 hold - Supine Bridge  - 1 x daily - 7 x weekly - 1-2 sets - 10 reps - 5 hold - Seated Hamstring Stretch  - 1 x daily - 7 x weekly - 1 sets - 3 reps - 20-30 hold    ASSESSMENT:   CLINICAL IMPRESSION: Patient unfortunately had an incident since he has last been here.  He reinjured his back  and has since recovered.  Session today understanding neutral in order to prevent further injury.  He was unable to do his exercises as much as  he would have liked due to pain but is back to baseline today with pain.  Patient was introduced to basic Pilates reformer exercises to reinforce theses concepts.  He stated as he left he "felt really good".  Will add further stabilization exercises next visit if he responds well to today's treatment.     OBJECTIVE IMPAIRMENTS: Abnormal gait, decreased mobility, difficulty walking, decreased ROM, decreased strength, increased fascial restrictions, impaired flexibility, and pain.    ACTIVITY LIMITATIONS: lifting, bending, sitting, standing, squatting, sleeping, stairs, and locomotion level   PARTICIPATION LIMITATIONS: interpersonal relationship, driving, shopping, community activity, occupation, and yard work   PERSONAL FACTORS: Past/current experiences, Time since onset of injury/illness/exacerbation, and 1-2 comorbidities: Previous back surgery,  are also affecting patient's functional outcome.    REHAB POTENTIAL: Excellent   CLINICAL DECISION MAKING: Stable/uncomplicated   EVALUATION COMPLEXITY: Low     GOALS: Goals reviewed with patient? Yes   SHORT TERM GOALS: Target date: 06/17/2022     Pt will be I with HEP for core and hip flexibility  Baseline: Goal status: INITIAL   2.  Pt will be able to acquire better seating, positioning to improve comfort at work  Baseline:  Goal status: INITIAL   3.  Pt will be able to understand posture and hinging to preserve back with lifting and ADLs.  Baseline:  Goal status: INITIAL   4.  Patient will complete functional testing (for balance, strength, dynamic gait) and set goal Baseline:  Goal status: INITIAL       LONG TERM GOALS: Target date: 07/15/2022       Patient will improve Foto score to 56% or greater to demonstrate improved functional ability upon discharge Baseline: 46% Goal status: INITIAL   2.  Patient will be able to walk a mile without increasing back pain Baseline:  Goal status: INITIAL   3.  Patient will be able to  sit as long as he needs to for work meetings up to 4 hours with min increase in back pain Baseline:  Goal status: INITIAL   4.  Patient will safely squat and/or deadlift 25 pounds without increased back pain Baseline:  Goal status: INITIAL   5.  Patient will be independent with home exercise program upon discharge Baseline:  Goal status: INITIAL   6.  Further functional goals to be assessed Baseline:  Goal status: INITIAL   PLAN:   PT FREQUENCY: 2x/week   PT DURATION: 8 weeks   PLANNED INTERVENTIONS: Therapeutic exercises, Therapeutic activity, Neuromuscular re-education, Balance training, Gait training, Patient/Family education, Self Care, Joint mobilization, Spinal mobilization, Cryotherapy, Moist heat, Manual therapy, and Re-evaluation.Pilates    PLAN FOR NEXT SESSION: Recumbent bike, check HEP, hip hinging and core stabilization.  Consider Pilates.     Karie Mainland, PT 06/02/22 8:57 AM Phone: [phone] Fax: [phone]

## 2022-06-02 ENCOUNTER — Encounter: Payer: Self-pay | Admitting: Physical Therapy

## 2022-06-02 ENCOUNTER — Ambulatory Visit (INDEPENDENT_AMBULATORY_CARE_PROVIDER_SITE_OTHER): Payer: BC Managed Care – PPO | Admitting: Family Medicine

## 2022-06-02 ENCOUNTER — Ambulatory Visit: Payer: BC Managed Care – PPO | Admitting: Physical Therapy

## 2022-06-02 VITALS — BP 138/80 | HR 58 | Temp 97.1°F | Ht 72.0 in | Wt 191.8 lb

## 2022-06-02 DIAGNOSIS — E785 Hyperlipidemia, unspecified: Secondary | ICD-10-CM | POA: Diagnosis not present

## 2022-06-02 DIAGNOSIS — Z0001 Encounter for general adult medical examination with abnormal findings: Secondary | ICD-10-CM

## 2022-06-02 DIAGNOSIS — N529 Male erectile dysfunction, unspecified: Secondary | ICD-10-CM

## 2022-06-02 DIAGNOSIS — R361 Hematospermia: Secondary | ICD-10-CM

## 2022-06-02 DIAGNOSIS — I10 Essential (primary) hypertension: Secondary | ICD-10-CM

## 2022-06-02 DIAGNOSIS — M5416 Radiculopathy, lumbar region: Secondary | ICD-10-CM

## 2022-06-02 DIAGNOSIS — M25652 Stiffness of left hip, not elsewhere classified: Secondary | ICD-10-CM

## 2022-06-02 DIAGNOSIS — Z13220 Encounter for screening for lipoid disorders: Secondary | ICD-10-CM

## 2022-06-02 DIAGNOSIS — N400 Enlarged prostate without lower urinary tract symptoms: Secondary | ICD-10-CM

## 2022-06-02 DIAGNOSIS — E559 Vitamin D deficiency, unspecified: Secondary | ICD-10-CM | POA: Diagnosis not present

## 2022-06-02 DIAGNOSIS — M5459 Other low back pain: Secondary | ICD-10-CM

## 2022-06-02 DIAGNOSIS — R209 Unspecified disturbances of skin sensation: Secondary | ICD-10-CM

## 2022-06-02 DIAGNOSIS — M25651 Stiffness of right hip, not elsewhere classified: Secondary | ICD-10-CM

## 2022-06-02 DIAGNOSIS — Z131 Encounter for screening for diabetes mellitus: Secondary | ICD-10-CM

## 2022-06-02 LAB — URINALYSIS, ROUTINE W REFLEX MICROSCOPIC
Bilirubin Urine: NEGATIVE
Hgb urine dipstick: NEGATIVE
Ketones, ur: NEGATIVE
Leukocytes,Ua: NEGATIVE
Nitrite: NEGATIVE
RBC / HPF: NONE SEEN (ref 0–?)
Specific Gravity, Urine: <=1.005 — AB (ref 1.000–1.030)
Total Protein, Urine: NEGATIVE
Urine Glucose: NEGATIVE
Urobilinogen, UA: 0.2 (ref 0.0–1.0)
WBC, UA: NONE SEEN (ref 0–?)
pH: 7 (ref 5.0–8.0)

## 2022-06-02 MED ORDER — ATORVASTATIN CALCIUM 20 MG PO TABS: 20.0000 mg | ORAL_TABLET | Freq: Every day | ORAL | 3 refills | Status: DC

## 2022-06-02 NOTE — Assessment & Plan Note (Signed)
Check lipids.  Continue Lipitor 20 mg daily. 

## 2022-06-02 NOTE — Progress Notes (Signed)
Chief Complaint:  Mason Vargas is a 67 y.o. male who presents today for his annual comprehensive physical exam.    Assessment/Plan:  New/Acute Problems: Hematospermia Reassured patient.  Likely due to underlying BPH however  will check UA and PSA today and refer to urology.  Chronic Problems Addressed Today: Hyperlipidemia Check lipids.  Continue Lipitor 20 mg daily.  BPH (benign prostatic hyperplasia) Check PSA.   ED (erectile dysfunction) Stable on Cialis 5 mg daily.  Does not need refill today.  Hypertension Blood pressure at goal on losartan-HCTZ 50-12.5 once daily and Metroprolol tartrate 25 mg twice daily.  Vitamin D deficiency Check vitamin D.   Preventative Healthcare: Check labs.  Up-to-date on vaccines and colon cancer screening.  Patient Counseling(The following topics were reviewed and/or handout was given):  -Nutrition: Stressed importance of moderation in sodium/caffeine intake, saturated fat and cholesterol, caloric balance, sufficient intake of fresh fruits, vegetables, and fiber.  -Stressed the importance of regular exercise.   -Substance Abuse: Discussed cessation/primary prevention of tobacco, alcohol, or other drug use; driving or other dangerous activities under the influence; availability of treatment for abuse.   -Injury prevention: Discussed safety belts, safety helmets, smoke detector, smoking near bedding or upholstery.   -Sexuality: Discussed sexually transmitted diseases, partner selection, use of condoms, avoidance of unintended pregnancy and contraceptive alternatives.   -Dental health: Discussed importance of regular tooth brushing, flossing, and dental visits.  -Health maintenance and immunizations reviewed. Please refer to Health maintenance section.  Return to care in 1 year for next preventative visit.     Subjective:  HPI:  He has no acute complaints today.   He has noticed some blood in his semen. This started a few weeks ago. No  dysuria. No lower urinary tract symptoms. No fevers or chills.   Lifestyle Diet: Balanced. Plenty of fruits and vegetables.  Exercise: Limited due to back pain.      06/02/2022    1:47 PM  Depression screen PHQ 2/9  Decreased Interest 0  Down, Depressed, Hopeless 0  PHQ - 2 Score 0    There are no preventive care reminders to display for this patient.   ROS: Per HPI, otherwise a complete review of systems was negative.   PMH:  The following were reviewed and entered/updated in epic: Past Medical History:  Diagnosis Date   GERD (gastroesophageal reflux disease)    on meds   Hyperlipidemia    on meds   Hypertension    on meds   Insomnia    Lumbar herniated disc    Sciatica 04/08/2018   Left leg   Snores    Patient Active Problem List   Diagnosis Date Noted   Aortic dilatation (HCC) 07/02/2021   Insomnia 04/22/2020   SVT (supraventricular tachycardia) 03/11/2020   Low back pain 10/31/2019   Abnormal chest x-ray 02/24/2019   Radiculopathy, lumbar region 09/23/2018   Atrophy of quadriceps femoris muscle 09/13/2018   GERD (gastroesophageal reflux disease) 03/22/2018   Hypertension 03/22/2018   Lumbar herniated disc 03/22/2014   BPH (benign prostatic hyperplasia) 03/03/2012   Family history of ischemic heart disease (IHD) 08/10/2011   Colon adenoma 06/11/2011   Vitamin D deficiency 06/11/2011   ED (erectile dysfunction) 06/02/2011   Hyperlipidemia 03/22/2002   Past Surgical History:  Procedure Laterality Date   CLOSED REDUCTION NASAL FRACTURE N/A 05/26/2018   Procedure: CLOSED REDUCTION NASAL FRACTURE;  Surgeon: Drema Halon, MD;  [facility];  Service: ENT;  Laterality: N/A;  COLONOSCOPY W/ POLYPECTOMY  2014   LUMBAR DISC SURGERY     SPINE SURGERY  05/13/2020   WISDOM TOOTH EXTRACTION      Family History  Problem Relation Age of Onset   Heart attack Father    Heart Problems Father    Hypertension Father    Hyperlipidemia  Father    Colon cancer Neg Hx    Pancreatic cancer Neg Hx    Stomach cancer Neg Hx    Colon polyps Neg Hx    Esophageal cancer Neg Hx    Rectal cancer Neg Hx     Medications- reviewed and updated Current Outpatient Medications  Medication Sig Dispense Refill   aspirin EC 81 MG tablet Take 1 tablet (81 mg total) by mouth daily. Swallow whole. 90 tablet 3   losartan-hydrochlorothiazide (HYZAAR) 50-12.5 MG tablet Take 1 tablet by mouth daily. 90 tablet 3   metoprolol tartrate (LOPRESSOR) 25 MG tablet Take 1 tablet (25 mg total) by mouth Two (2) times a day. 180 tablet 1   omeprazole (PRILOSEC) 20 MG capsule Take 1 capsule (20 mg total) by mouth daily. 90 capsule 3   tadalafil (CIALIS) 5 MG tablet Take 1 tablet (5 mg total) by mouth daily. 90 tablet 1   traMADol (ULTRAM) 50 MG tablet Take 50-100 mg by mouth every 6 (six) hours as needed for moderate pain or severe pain.     traZODone (DESYREL) 50 MG tablet Take 0.5-1 tablets (25-50 mg total) by mouth at bedtime as needed for sleep. 90 tablet 1   atorvastatin (LIPITOR) 20 MG tablet Take 1 tablet (20 mg total) by mouth at bedtime. 90 tablet 3   No current facility-administered medications for this visit.    Allergies-reviewed and updated No Known Allergies  Social History   Socioeconomic History   Marital status: Married    Spouse name: Not on file   Number of children: 0   Years of education: Not on file   Highest education level: Some college, no degree  Occupational History   Not on file  Tobacco Use   Smoking status: Never   Smokeless tobacco: Never  Vaping Use   Vaping Use: Never used  Substance and Sexual Activity   Alcohol use: Yes    Alcohol/week: 1.0 standard drink of alcohol    Types: 1 Standard drinks or equivalent per week    Comment: rarely   Drug use: No   Sexual activity: Yes    Birth control/protection: None  Other Topics Concern   Not on file  Social History Narrative   Right handed    Caffeine 33 -4  cups daily    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   Social Determinants of Health   Financial Resource Strain: Not on file  Food Insecurity: Not on file  Transportation Needs: Not on file  Physical Activity: Not on file  Stress: Not on file  Social Connections: Not on file        Objective:  Physical Exam: BP 138/80   Pulse (!) 58   Temp (!) 97.1 F (36.2 C) (Temporal)   Ht 6' (1.829 m)   Wt 191 lb 12.8 oz (87 kg)   SpO2 99%   BMI 26.01 kg/m   Body mass index is 26.01 kg/m. Wt Readings from Last 3 Encounters:  06/02/22 191 lb 12.8 oz (87 kg)  02/17/22 191 lb (86.6 kg)  01/01/22 184 lb (83.5 kg)  Gen: NAD, resting comfortably HEENT: TMs normal bilaterally. OP clear. No thyromegaly noted.  CV: RRR with no murmurs appreciated Pulm: NWOB, CTAB with no crackles, wheezes, or rhonchi GI: Normal bowel sounds present. Soft, Nontender, Nondistended. MSK: no edema, cyanosis, or clubbing noted Skin: warm, dry Neuro: CN2-12 grossly intact. Strength 5/5 in upper and lower extremities. Reflexes symmetric and intact bilaterally.  Psych: Normal affect and thought content     Aarini Slee M. [name], MD 06/02/2022 2:35 PM

## 2022-06-02 NOTE — Assessment & Plan Note (Signed)
Blood pressure at goal on losartan-HCTZ 50-12.5 once daily and Metroprolol tartrate 25 mg twice daily.

## 2022-06-02 NOTE — Assessment & Plan Note (Signed)
Check PSA. ?

## 2022-06-02 NOTE — Patient Instructions (Signed)
It was very nice to see you today!  We we will check blood work and a urine sample today.  Please continue to work on your diet and exercise.  Will refer to urology.  Return in about 1 year (around 06/02/2023) for Annual Physical.   Take care, Dr [name]  PLEASE NOTE:  If you had any lab tests, please let us know if you have not heard back within a few days. You may see your results on mychart before we have a chance to review them but we will give you a call once they are reviewed by [place].   If we ordered any referrals today, please let us know if you have not heard from their office within the next week.   If you had any urgent prescriptions sent in today, please check with the pharmacy within an hour of our visit to make sure the prescription was transmitted appropriately.   Please try these tips to maintain a healthy lifestyle:  Eat at least 3 REAL meals and 1-2 snacks per day.  Aim for no more than 5 hours between eating.  If you eat breakfast, please do so within one hour of getting up.   Each meal should contain half fruits/vegetables, one quarter protein, and one quarter carbs (no bigger than a computer mouse)  Cut down on sweet beverages. This includes juice, soda, and sweet tea.   Drink at least 1 glass of water with each meal and aim for at least 8 glasses per day  Exercise at least 150 minutes every week.    Preventive Care 21 Years and Older, Male Preventive care refers to lifestyle choices and visits with your health care provider that can promote health and wellness. Preventive care visits are also called wellness exams. What can I expect for my preventive care visit? Counseling During your preventive care visit, your health care provider may ask about your: Medical history, including: Past medical problems. Family medical history. History of falls. Current health, including: Emotional well-being. Home life and relationship well-being. Sexual activity. Memory  and ability to understand (cognition). Lifestyle, including: Alcohol, nicotine or tobacco, and drug use. Access to firearms. Diet, exercise, and sleep habits. Work and work Astronomer. Sunscreen use. Safety issues such as seatbelt and bike helmet use. Physical exam Your health care provider will check your: Height and weight. These may be used to calculate your BMI (body mass index). BMI is a measurement that tells if you are at a healthy weight. Waist circumference. This measures the distance around your waistline. This measurement also tells if you are at a healthy weight and may help predict your risk of certain diseases, such as type 2 diabetes and high blood pressure. Heart rate and blood pressure. Body temperature. Skin for abnormal spots. What immunizations do I need?  Vaccines are usually given at various ages, according to a schedule. Your health care provider will recommend vaccines for you based on your age, medical history, and lifestyle or other factors, such as travel or where you work. What tests do I need? Screening Your health care provider may recommend screening tests for certain conditions. This may include: Lipid and cholesterol levels. Diabetes screening. This is done by checking your blood sugar (glucose) after you have not eaten for a while (fasting). Hepatitis C test. Hepatitis B test. HIV (human immunodeficiency virus) test. STI (sexually transmitted infection) testing, if you are at risk. Lung cancer screening. Colorectal cancer screening. Prostate cancer screening. Abdominal aortic aneurysm (AAA) screening. You  may need this if you are a current or former smoker. Talk with your health care provider about your test results, treatment options, and if necessary, the need for more tests. Follow these instructions at home: Eating and drinking  Eat a diet that includes fresh fruits and vegetables, whole grains, lean protein, and low-fat dairy products. Limit  your intake of foods with high amounts of sugar, saturated fats, and salt. Take vitamin and mineral supplements as recommended by your health care provider. Do not drink alcohol if your health care provider tells you not to drink. If you drink alcohol: Limit how much you have to 0-2 drinks a day. Know how much alcohol is in your drink. In the [place] one drink equals one 12 oz bottle of beer (355 mL), one 5 oz glass of wine (148 mL), or one 1 oz glass of hard liquor (44 mL). Lifestyle Brush your teeth every morning and night with fluoride toothpaste. Floss one time each day. Exercise for at least 30 minutes 5 or more days each week. Do not use any products that contain nicotine or tobacco. These products include cigarettes, chewing tobacco, and vaping devices, such as e-cigarettes. If you need help quitting, ask your health care provider. Do not use drugs. If you are sexually active, practice safe sex. Use a condom or other form of protection to prevent STIs. Take aspirin only as told by your health care provider. Make sure that you understand how much to take and what form to take. Work with your health care provider to find out whether it is safe and beneficial for you to take aspirin daily. Ask your health care provider if you need to take a cholesterol-lowering medicine (statin). Find healthy ways to manage stress, such as: Meditation, yoga, or listening to music. Journaling. Talking to a trusted person. Spending time with friends and family. Safety Always wear your seat belt while driving or riding in a vehicle. Do not drive: If you have been drinking alcohol. Do not ride with someone who has been drinking. When you are tired or distracted. While texting. If you have been using any mind-altering substances or drugs. Wear a helmet and other protective equipment during sports activities. If you have firearms in your house, make sure you follow all gun safety procedures. Minimize  exposure to UV radiation to reduce your risk of skin cancer. What's next? Visit your health care provider once a year for an annual wellness visit. Ask your health care provider how often you should have your eyes and teeth checked. Stay up to date on all vaccines. This information is not intended to replace advice given to you by your health care provider. Make sure you discuss any questions you have with your health care provider. Document Revised: 06/26/2020 Document Reviewed: 06/26/2020 Elsevier Patient Education  2023 [employer].

## 2022-06-02 NOTE — Assessment & Plan Note (Signed)
Stable on Cialis 5 mg daily.  Does not need refill today.

## 2022-06-02 NOTE — Assessment & Plan Note (Signed)
Check vitamin D. 

## 2022-06-03 LAB — URINE CULTURE
MICRO NUMBER:: 14984568
Result:: NO GROWTH
SPECIMEN QUALITY:: ADEQUATE

## 2022-06-03 LAB — COMPREHENSIVE METABOLIC PANEL
ALT: 15 U/L (ref 0–53)
AST: 18 U/L (ref 0–37)
Albumin: 4.1 g/dL (ref 3.5–5.2)
Alkaline Phosphatase: 60 U/L (ref 39–117)
BUN: 16 mg/dL (ref 6–23)
CO2: 31 mEq/L (ref 19–32)
Calcium: 9 mg/dL (ref 8.4–10.5)
Chloride: 99 mEq/L (ref 96–112)
Creatinine, Ser: 0.74 mg/dL (ref 0.40–1.50)
GFR: 94.59 mL/min (ref >60.00)
Glucose, Bld: 84 mg/dL (ref 70–99)
Potassium: 3.4 mEq/L — ABNORMAL LOW (ref 3.5–5.1)
Sodium: 137 mEq/L (ref 135–145)
Total Bilirubin: 2.6 mg/dL — ABNORMAL HIGH (ref 0.2–1.2)
Total Protein: 6.3 g/dL (ref 6.0–8.3)

## 2022-06-03 LAB — LIPID PANEL
Cholesterol: 177 mg/dL (ref 0–200)
HDL: 40.7 mg/dL (ref >39.00)
LDL Cholesterol: 117 mg/dL — ABNORMAL HIGH (ref 0–99)
NonHDL: 136.06
Total CHOL/HDL Ratio: 4
Triglycerides: 97 mg/dL (ref 0.0–149.0)
VLDL: 19.4 mg/dL (ref 0.0–40.0)

## 2022-06-03 LAB — VITAMIN D 25 HYDROXY (VIT D DEFICIENCY, FRACTURES): VITD: 40.03 ng/mL (ref 30.00–100.00)

## 2022-06-03 LAB — CBC
HCT: 42.1 % (ref 39.0–52.0)
Hemoglobin: 14.2 g/dL (ref 13.0–17.0)
MCHC: 33.7 g/dL (ref 30.0–36.0)
MCV: 81.7 fl (ref 78.0–100.0)
Platelets: 168 10*3/uL (ref 150.0–400.0)
RBC: 5.15 Mil/uL (ref 4.22–5.81)
RDW: 13.8 % (ref 11.5–15.5)
WBC: 4.9 10*3/uL (ref 4.0–10.5)

## 2022-06-03 LAB — HEMOGLOBIN A1C: Hgb A1c MFr Bld: 5.5 % (ref 4.6–6.5)

## 2022-06-03 LAB — TSH: TSH: 1.82 u[IU]/mL (ref 0.35–5.50)

## 2022-06-05 ENCOUNTER — Encounter: Payer: Self-pay | Admitting: Physical Therapy

## 2022-06-05 ENCOUNTER — Ambulatory Visit: Payer: BC Managed Care – PPO | Admitting: Physical Therapy

## 2022-06-05 DIAGNOSIS — M5459 Other low back pain: Secondary | ICD-10-CM

## 2022-06-05 NOTE — Progress Notes (Signed)
Urine testing was negative.  PSA was normal.  No clear obvious explanation for the symptoms that he was having.  He needs to follow-up with urology as we discussed at his office visit.  His cholesterol is up quite a bit since last time that we checked.  Can we make sure that he is still taking his statin?  If so we need to increase the dose.  The rest of his labs are all stable and we can recheck in a year.

## 2022-06-05 NOTE — Therapy (Signed)
OUTPATIENT PHYSICAL THERAPY TREATMENT NOTE   Patient Name: Mason Vargas MRN: [id number] DOB:[birth year], 67 y.o., male Today's Date: 06/05/2022  PCP: Jacquiline Doe MD    REFERRING PROVIDER: Tressie Stalker, MD  END OF SESSION:   PT End of Session - 06/05/22 0802     Visit Number 4    Number of Visits 16    Date for PT Re-Evaluation 07/15/22    Authorization Type BCBS    PT Start Time 0800    PT Stop Time 0842    PT Time Calculation (min) 42 min              Past Medical History:  Diagnosis Date   GERD (gastroesophageal reflux disease)    on meds   Hyperlipidemia    on meds   Hypertension    on meds   Insomnia    Lumbar herniated disc    Sciatica 04/08/2018   Left leg   Snores    Past Surgical History:  Procedure Laterality Date   CLOSED REDUCTION NASAL FRACTURE N/A 05/26/2018   Procedure: CLOSED REDUCTION NASAL FRACTURE;  Surgeon: Drema Halon, MD;  Location: [facility];  Service: ENT;  Laterality: N/A;   COLONOSCOPY W/ POLYPECTOMY  2014   LUMBAR DISC SURGERY     SPINE SURGERY  05/13/2020   WISDOM TOOTH EXTRACTION     Patient Active Problem List   Diagnosis Date Noted   Aortic dilatation (HCC) 07/02/2021   Insomnia 04/22/2020   SVT (supraventricular tachycardia) 03/11/2020   Low back pain 10/31/2019   Abnormal chest x-ray 02/24/2019   Radiculopathy, lumbar region 09/23/2018   Atrophy of quadriceps femoris muscle 09/13/2018   GERD (gastroesophageal reflux disease) 03/22/2018   Hypertension 03/22/2018   Lumbar herniated disc 03/22/2014   BPH (benign prostatic hyperplasia) 03/03/2012   Family history of ischemic heart disease (IHD) 08/10/2011   Colon adenoma 06/11/2011   Vitamin D deficiency 06/11/2011   ED (erectile dysfunction) 06/02/2011   Hyperlipidemia 03/22/2002    REFERRING DIAG: M51.16 (ICD-10-CM) - Intervertebral disc disorders with radiculopathy, lumbar region  THERAPY DIAG:  Other low back pain  Rationale for  Evaluation and Treatment Rehabilitation  PERTINENT HISTORY:  See above Cardiac history   PRECAUTIONS: None and Other: arrythmia -caffeine related   SUBJECTIVE:                                                                                                                                                                                      SUBJECTIVE STATEMENT:   The back is feeling pretty good. The reformer was great last time.   PAIN:  Are you having pain? Yes:  NPRS scale: 1.5/10 Pain location: L side L5  Pain description: sore  Aggravating factors: sitting  Relieving factors: MHP, Aleve/meds, Lying supine  End of the day, after sitting pain can be 6/10-9/10   OBJECTIVE: (objective measures completed at initial evaluation unless otherwise dated)   DIAGNOSTIC FINDINGS:  XR done Dr. [name] -normal    PATIENT SURVEYS:  FOTO 46%   SCREENING FOR RED FLAGS: Bowel or bladder incontinence: No Spinal tumors: No Cauda equina syndrome: No Compression fracture: No Abdominal aneurysm: No   COGNITION: Overall cognitive status: Within functional limits for tasks assessed                          SENSATION: WFL   MUSCLE LENGTH: Hamstrings: 90/90 Right 30+ deg; Left 40+ deg [name] test: Right tight  deg; Left tighter deg   POSTURE: rounded shoulders, forward head, and decreased lumbar lordosis   PALPATION: Pain in bilateral lumbar region left side >  right.  Palpation to right L4-5 and superior glutes increased pain on the left side.   Spasm left piriformis Leg LUMBAR ROM:    AROM eval  Flexion 25% limited   Extension 50% limited  Right lateral flexion WFL  Left lateral flexion WFL  Right rotation 25% limited   Left rotation 25% limited    (Blank rows = not tested)   LOWER EXTREMITY ROM:      PROM  Right eval Left eval  Hip flexion WNL  WNL  Hip extension      Hip abduction      Hip adduction      Hip internal rotation 45 deg  20-25 deg   Hip external  rotation Min restriction Min restriction   Knee flexion WNL WNL  Knee extension WNL  WNL  Ankle dorsiflexion      Ankle plantarflexion      Ankle inversion      Ankle eversion       (Blank rows = not tested)   LOWER EXTREMITY MMT:     MMT Right eval Left eval  Hip flexion 5/5 4/5  Hip extension      Hip abduction 4+/5 4/5  Hip adduction      Hip internal rotation      Hip external rotation      Knee flexion 4+/5 4/5  Knee extension 5/5 4/5  Ankle dorsiflexion 5/5 5/5  Ankle plantarflexion NT NT  Ankle inversion      Ankle eversion       (Blank rows = not tested)   LUMBAR SPECIAL TESTS:  Straight leg raise test: Negative and Slump test: Positive   FUNCTIONAL TESTS:  5 times sit to stand: NT   GAIT: Distance walked: 150 Assistive device utilized: None Level of assistance: Complete Independence Comments: min limp, favoring L side, shortened stance time on L side.    TODAY'S TREATMENT:                 [facility] Adult PT Treatment:                                                DATE: 06/05/22 Therapeutic Exercise: Neutral spine Tr A x 10 activation  Lumbar stab I : march,  Bridge x 10  90/90 5 sec x 10  Bridge x 10 Lumbar stab bent  knee fall outs, bil. Single and resisted  Cat camel Qped progressing to bird dogs Hip flexor stretches      [facility] Adult PT Treatment:                                                DATE: 06/02/22 Therapeutic Exercise: Recumbent bike L3 for 5 min  HEP check: knee to chest , knee to opp shoulder, LTR , supine figure 4 (better in sitting)  Bridging x 10 , added exhale to lift for abdominal support  Ball under pelvis for A/P tilting and awareness Neutral spine Tr A x 10 activation  Lumbar stab I : march, clam and SLR  Sit to stand hinge with yardstick cues Quadruped hinge on Reformer   Reformer exercises: Quadruped 1 red then 1 blue x 10 each (plank on knees) Bird dog x  8  Supine arm Arcs 1 red 1 Yellow x 10  Footwork 2 red 1 blue  parallel heels, turnout about x 15 each  Heel raises and calf stretch 2 red 1 blue                                                                                                               [facility] Adult PT Treatment:                                                DATE: 05/21/22 Therapeutic Exercise: Nustep L5 UE/LE x 5 minutes  Seated figure 4 push and pull  SKTC LTR 90/90- increased back pain SLR with ab brace x 10 SLR (ER) with ab brace x 10  Hamstring stretch supine with strap  Bridge x 10 Left/right  hip flexor stretch EOM Seated option for hamstring stretch     DATE: 05/20/22  EVAL     PATIENT EDUCATION:  Education details: PT, POC, HEP  and core  Person educated: Patient Education method: Programmer, multimedia, Demonstration, and Handouts Education comprehension: verbalized understanding, returned demonstration, and needs further education   HOME EXERCISE PROGRAM: Access Code: [email or web address] URL: [email or web address] Date: 05/20/2022 Prepared by: Karie Mainland   Exercises - Supine Single Knee to Chest Stretch  - 1-2 x daily - 7 x weekly - 1 sets - 3-5 reps - 30 hold - Seated Figure 4 Piriformis Stretch  - 1-2 x daily - 7 x weekly - 1 sets - 3-5 reps - 30 hold - Supine Lower Trunk Rotation  - 1-2 x daily - 7 x weekly - 1 sets - 10 reps - 10 hold - Supine 90/90 Abdominal Bracing  - 1 x daily - 7 x weekly - 1 sets - 3-5 reps - 30 hold 05/21/22 - Active straight leg raise  - 1 x daily - 7 x weekly - 1-2  sets - 10 reps - 3 hold - Supine Bridge  - 1 x daily - 7 x weekly - 1-2 sets - 10 reps - 5 hold - Seated Hamstring Stretch  - 1 x daily - 7 x weekly - 1 sets - 3 reps - 20-30 hold 06/05/22 - Bird Dog  - 1 x daily - 7 x weekly - 1 sets - 10 reps - 5-10 hold - Modified Thomas Stretch  - 1 x daily - 7 x weekly - 1 sets - 3 reps - 30 hold   ASSESSMENT:   CLINICAL IMPRESSION: Pt reports his overall back pain has improved and notes improved tolerance to shopping in hardware  store without using a cart. He felt good after last session and liked the Land. He was able to return to 90/90 bracing without increased pain. Did well with quadruped stabilization so added this to his HEP.     OBJECTIVE IMPAIRMENTS: Abnormal gait, decreased mobility, difficulty walking, decreased ROM, decreased strength, increased fascial restrictions, impaired flexibility, and pain.    ACTIVITY LIMITATIONS: lifting, bending, sitting, standing, squatting, sleeping, stairs, and locomotion level   PARTICIPATION LIMITATIONS: interpersonal relationship, driving, shopping, community activity, occupation, and yard work   PERSONAL FACTORS: Past/current experiences, Time since onset of injury/illness/exacerbation, and 1-2 comorbidities: Previous back surgery,  are also affecting patient's functional outcome.    REHAB POTENTIAL: Excellent   CLINICAL DECISION MAKING: Stable/uncomplicated   EVALUATION COMPLEXITY: Low     GOALS: Goals reviewed with patient? Yes   SHORT TERM GOALS: Target date: 06/17/2022     Pt will be I with HEP for core and hip flexibility  Baseline: Goal status: INITIAL   2.  Pt will be able to acquire better seating, positioning to improve comfort at work  Baseline:  Goal status: INITIAL   3.  Pt will be able to understand posture and hinging to preserve back with lifting and ADLs.  Baseline:  Goal status: INITIAL   4.  Patient will complete functional testing (for balance, strength, dynamic gait) and set goal Baseline:  Goal status: INITIAL       LONG TERM GOALS: Target date: 07/15/2022       Patient will improve Foto score to 56% or greater to demonstrate improved functional ability upon discharge Baseline: 46% Goal status: INITIAL   2.  Patient will be able to walk a mile without increasing back pain Baseline:  Goal status: INITIAL   3.  Patient will be able to sit as long as he needs to for work meetings up to 4 hours with min increase in  back pain Baseline:  Goal status: INITIAL   4.  Patient will safely squat and/or deadlift 25 pounds without increased back pain Baseline:  Goal status: INITIAL   5.  Patient will be independent with home exercise program upon discharge Baseline:  Goal status: INITIAL   6.  Further functional goals to be assessed Baseline:  Goal status: INITIAL   PLAN:   PT FREQUENCY: 2x/week   PT DURATION: 8 weeks   PLANNED INTERVENTIONS: Therapeutic exercises, Therapeutic activity, Neuromuscular re-education, Balance training, Gait training, Patient/Family education, Self Care, Joint mobilization, Spinal mobilization, Cryotherapy, Moist heat, Manual therapy, and Re-evaluation.Pilates    PLAN FOR NEXT SESSION: Recumbent bike, check HEP, hip hinging and core stabilization.  Consider Pilates.     Jannette Spanner, PTA 06/05/22 11:24 AM Phone: [phone] Fax: [phone]

## 2022-06-10 NOTE — Progress Notes (Signed)
He has had similar elevations in the past. He probably has a benign condition called Gilbert's Syndrome. This should never cause him any issues but there are a few extra tests we can do to confirm this if he is interested. He can schedule an appointment to discuss.  Katina Degree. [name], MD 06/10/2022 7:30 AM

## 2022-06-12 ENCOUNTER — Telehealth: Payer: Self-pay

## 2022-06-15 ENCOUNTER — Ambulatory Visit: Payer: BC Managed Care – PPO | Admitting: Physical Therapy

## 2022-06-17 ENCOUNTER — Ambulatory Visit: Payer: BC Managed Care – PPO

## 2022-06-22 ENCOUNTER — Ambulatory Visit: Payer: BC Managed Care – PPO | Attending: Family Medicine | Admitting: Physical Therapy

## 2022-06-22 ENCOUNTER — Encounter: Payer: Self-pay | Admitting: Physical Therapy

## 2022-06-22 DIAGNOSIS — M25652 Stiffness of left hip, not elsewhere classified: Secondary | ICD-10-CM | POA: Insufficient documentation

## 2022-06-22 DIAGNOSIS — M5459 Other low back pain: Secondary | ICD-10-CM | POA: Diagnosis present

## 2022-06-22 DIAGNOSIS — M25651 Stiffness of right hip, not elsewhere classified: Secondary | ICD-10-CM | POA: Insufficient documentation

## 2022-06-22 NOTE — Therapy (Signed)
OUTPATIENT PHYSICAL THERAPY TREATMENT NOTE   Patient Name: Mason Vargas MRN: [id number] DOB:[birth year], 67 y.o., male Today's Date: 06/22/2022  PCP: Jacquiline Doe MD    REFERRING PROVIDER: Tressie Stalker, MD  END OF SESSION:   PT End of Session - 06/22/22 0804     Visit Number 5    Number of Visits 16    Date for PT Re-Evaluation 07/15/22    Authorization Type BCBS    PT Start Time 0802    PT Stop Time 0845    PT Time Calculation (min) 43 min              Past Medical History:  Diagnosis Date   GERD (gastroesophageal reflux disease)    on meds   Hyperlipidemia    on meds   Hypertension    on meds   Insomnia    Lumbar herniated disc    Sciatica 04/08/2018   Left leg   Snores    Past Surgical History:  Procedure Laterality Date   CLOSED REDUCTION NASAL FRACTURE N/A 05/26/2018   Procedure: CLOSED REDUCTION NASAL FRACTURE;  Surgeon: Drema Halon, MD;  Location: [facility];  Service: ENT;  Laterality: N/A;   COLONOSCOPY W/ POLYPECTOMY  2014   LUMBAR DISC SURGERY     SPINE SURGERY  05/13/2020   WISDOM TOOTH EXTRACTION     Patient Active Problem List   Diagnosis Date Noted   Aortic dilatation (HCC) 07/02/2021   Insomnia 04/22/2020   SVT (supraventricular tachycardia) 03/11/2020   Low back pain 10/31/2019   Abnormal chest x-ray 02/24/2019   Radiculopathy, lumbar region 09/23/2018   Atrophy of quadriceps femoris muscle 09/13/2018   GERD (gastroesophageal reflux disease) 03/22/2018   Hypertension 03/22/2018   Lumbar herniated disc 03/22/2014   BPH (benign prostatic hyperplasia) 03/03/2012   Family history of ischemic heart disease (IHD) 08/10/2011   Colon adenoma 06/11/2011   Vitamin D deficiency 06/11/2011   ED (erectile dysfunction) 06/02/2011   Hyperlipidemia 03/22/2002    REFERRING DIAG: M51.16 (ICD-10-CM) - Intervertebral disc disorders with radiculopathy, lumbar region  THERAPY DIAG:  Other low back pain  Stiffness of  right hip, not elsewhere classified  Stiffness of left hip, not elsewhere classified  Rationale for Evaluation and Treatment Rehabilitation  PERTINENT HISTORY:  See above Cardiac history   PRECAUTIONS: None and Other: arrythmia -caffeine related   SUBJECTIVE:                                                                                                                                                                                      SUBJECTIVE STATEMENT:   I had to walk up  5 flights of stairs x 2 last week. I felt wore out.  I feel like my gait is still not good. Sometimes still have pain with turning in bed. I think I have plateaued. But I have not regressed. I am sore today. We dug up plants and transplanted them this weekend.   PAIN:  Are you having pain? Yes: NPRS scale: 2/10 Pain location: L side L5  Pain description: sore  Aggravating factors: sitting  Relieving factors: MHP, Aleve/meds, Lying supine  End of the day, after sitting pain can be 6/10-9/10   OBJECTIVE: (objective measures completed at initial evaluation unless otherwise dated)   DIAGNOSTIC FINDINGS:  XR done Dr. [name] -normal    PATIENT SURVEYS:  FOTO 46% FOTO 45% 06/22/22   SCREENING FOR RED FLAGS: Bowel or bladder incontinence: No Spinal tumors: No Cauda equina syndrome: No Compression fracture: No Abdominal aneurysm: No   COGNITION: Overall cognitive status: Within functional limits for tasks assessed                          SENSATION: WFL   MUSCLE LENGTH: Hamstrings: 90/90 Right 30+ deg; Left 40+ deg [name] test: Right tight  deg; Left tighter deg   POSTURE: rounded shoulders, forward head, and decreased lumbar lordosis   PALPATION: Pain in bilateral lumbar region left side >  right.  Palpation to right L4-5 and superior glutes increased pain on the left side.   Spasm left piriformis Leg LUMBAR ROM:    AROM eval  Flexion 25% limited   Extension 50% limited  Right lateral  flexion WFL  Left lateral flexion WFL  Right rotation 25% limited   Left rotation 25% limited    (Blank rows = not tested)   LOWER EXTREMITY ROM:      PROM  Right eval Left eval  Hip flexion WNL  WNL  Hip extension      Hip abduction      Hip adduction      Hip internal rotation 45 deg  20-25 deg   Hip external rotation Min restriction Min restriction   Knee flexion WNL WNL  Knee extension WNL  WNL  Ankle dorsiflexion      Ankle plantarflexion      Ankle inversion      Ankle eversion       (Blank rows = not tested)   LOWER EXTREMITY MMT:     MMT Right eval Left eval  Hip flexion 5/5 4/5  Hip extension      Hip abduction 4+/5 4/5  Hip adduction      Hip internal rotation      Hip external rotation      Knee flexion 4+/5 4/5  Knee extension 5/5 4/5  Ankle dorsiflexion 5/5 5/5  Ankle plantarflexion NT NT  Ankle inversion      Ankle eversion       (Blank rows = not tested)   LUMBAR SPECIAL TESTS:  Straight leg raise test: Negative and Slump test: Positive   FUNCTIONAL TESTS:  5 times sit to stand: NT   GAIT: Distance walked: 150 Assistive device utilized: None Level of assistance: Complete Independence Comments: min limp, favoring L side, shortened stance time on L side.    TODAY'S TREATMENT:                 [facility] Adult PT Treatment:  DATE: 06/22/22 Therapeutic Exercise: Upper trap stretch Levator stretch Scap retract Chin tuck   Sit-Fit balance trial for seated pelvic mobility- consider large ball for sitting? Therapeutic Activity: FOTO Hip hinge with dowel seated- mod cues Hip hinge with dowel standing- mod cues    [facility] Adult PT Treatment:                                                DATE: 06/05/22 Therapeutic Exercise: Neutral spine Tr A x 10 activation  Lumbar stab I : march,  Bridge x 10  90/90 5 sec x 10  Bridge x 10 Lumbar stab bent knee fall outs, bil. Single and resisted  Cat camel Qped  progressing to bird dogs Hip flexor stretches      [facility] Adult PT Treatment:                                                DATE: 06/02/22 Therapeutic Exercise: Recumbent bike L3 for 5 min  HEP check: knee to chest , knee to opp shoulder, LTR , supine figure 4 (better in sitting)  Bridging x 10 , added exhale to lift for abdominal support  Ball under pelvis for A/P tilting and awareness Neutral spine Tr A x 10 activation  Lumbar stab I : march, clam and SLR  Sit to stand hinge with yardstick cues Quadruped hinge on Reformer   Reformer exercises: Quadruped 1 red then 1 blue x 10 each (plank on knees) Bird dog x  8  Supine arm Arcs 1 red 1 Yellow x 10  Footwork 2 red 1 blue parallel heels, turnout about x 15 each  Heel raises and calf stretch 2 red 1 blue                                                                                                               [facility] Adult PT Treatment:                                                DATE: 05/21/22 Therapeutic Exercise: Nustep L5 UE/LE x 5 minutes  Seated figure 4 push and pull  SKTC LTR 90/90- increased back pain SLR with ab brace x 10 SLR (ER) with ab brace x 10  Hamstring stretch supine with strap  Bridge x 10 Left/right  hip flexor stretch EOM Seated option for hamstring stretch     DATE: 05/20/22  EVAL     PATIENT EDUCATION:  Education details: PT, POC, HEP  and core  Person educated: Patient Education method: Explanation, Demonstration, and Handouts Education comprehension: verbalized understanding, returned demonstration, and needs further  education   HOME EXERCISE PROGRAM: Access Code: [email or web address] URL: [email or web address] Date: 05/20/2022 Prepared by: Karie Mainland   Exercises - Supine Single Knee to Chest Stretch  - 1-2 x daily - 7 x weekly - 1 sets - 3-5 reps - 30 hold - Seated Figure 4 Piriformis Stretch  - 1-2 x daily - 7 x weekly - 1 sets - 3-5 reps - 30 hold - Supine Lower Trunk Rotation   - 1-2 x daily - 7 x weekly - 1 sets - 10 reps - 10 hold - Supine 90/90 Abdominal Bracing  - 1 x daily - 7 x weekly - 1 sets - 3-5 reps - 30 hold 05/21/22 - Active straight leg raise  - 1 x daily - 7 x weekly - 1-2 sets - 10 reps - 3 hold - Supine Bridge  - 1 x daily - 7 x weekly - 1-2 sets - 10 reps - 5 hold - Seated Hamstring Stretch  - 1 x daily - 7 x weekly - 1 sets - 3 reps - 20-30 hold 06/05/22 - Bird Dog  - 1 x daily - 7 x weekly - 1 sets - 10 reps - 5-10 hold - Modified Thomas Stretch  - 1 x daily - 7 x weekly - 1 sets - 3 reps - 30 hold 06/22/22 - Seated Upper Trapezius Stretch  - 1 x daily - 7 x weekly - 3 sets - 10 reps - Gentle Levator Scapulae Stretch  - 1 x daily - 7 x weekly - 3 sets - 10 reps - Seated Scapular Retraction  - 1 x daily - 7 x weekly - 3 sets - 10 reps - Seated Cervical Retraction  - 1 x daily - 7 x weekly - 1-2 sets - 10 reps - 5 hold - Hip hinge with dowel as needed for alignment  - 1 x daily - 7 x weekly - 1-2 sets - 10 reps - Seated Hip Hinge with Dowel  - 1 x daily - 7 x weekly - 1-2 sets - 10 reps   ASSESSMENT:   CLINICAL IMPRESSION: Pt reports he has generalized soreness from yard work/ gardening over the weekend which included transplanting plants. Pt reports he feels like he has reached a plateau with his back pain improvement. He is compliant with HEP. He notes continued issues with posture and neck pain due to seated job. He does endorse improvement in sitting tolerance after adding a lumbar support pillow. This has improved his back pain. LTG# 3 met. Added postural activation exercises and neck stretches today with good tolerance. Worked on beginner hip hinge in seated and standing using wooden dowel. He has considerable difficulty with this. It was added to HEP as well as postural exercises.      OBJECTIVE IMPAIRMENTS: Abnormal gait, decreased mobility, difficulty walking, decreased ROM, decreased strength, increased fascial restrictions, impaired  flexibility, and pain.    ACTIVITY LIMITATIONS: lifting, bending, sitting, standing, squatting, sleeping, stairs, and locomotion level   PARTICIPATION LIMITATIONS: interpersonal relationship, driving, shopping, community activity, occupation, and yard work   PERSONAL FACTORS: Past/current experiences, Time since onset of injury/illness/exacerbation, and 1-2 comorbidities: Previous back surgery,  are also affecting patient's functional outcome.    REHAB POTENTIAL: Excellent   CLINICAL DECISION MAKING: Stable/uncomplicated   EVALUATION COMPLEXITY: Low     GOALS: Goals reviewed with patient? Yes   SHORT TERM GOALS: Target date: 06/17/2022     Pt will be I with HEP for core and hip flexibility  Baseline: Goal status: MET   2.  Pt will be able to acquire better seating, positioning to improve comfort at work  Baseline:  06/22/22: aquired lumbar support pillow, not have 90% improvement in sitting tolerance Goal status: MET   3.  Pt will be able to understand posture and hinging to preserve back with lifting and ADLs.  Baseline:  Goal status: improving   4.  Patient will complete functional testing (for balance, strength, dynamic gait) and set goal Baseline:  Goal status: INITIAL       LONG TERM GOALS: Target date: 07/15/2022       Patient will improve Foto score to 56% or greater to demonstrate improved functional ability upon discharge Baseline: 46% 06/22/22: 45%  Goal status: ONGOING   2.  Patient will be able to walk a mile without increasing back pain Baseline:  06/22/22: unable to walk 1 full mile, maybe half Goal status: ONGOING   3.  Patient will be able to sit as long as he needs to for work meetings up to 4 hours with min increase in back pain Baseline:  06/22/22:  aquired lumbar support pillow, not have 90% improvement in sitting tolerance Goal status: MET   4.  Patient will safely squat and/or deadlift 25 pounds without increased back pain Baseline:  Goal  status: Pt reports he was told by surgeon never to lift more than 10# / DEFERRED?    5.  Patient will be independent with home exercise program upon discharge Baseline:  Goal status: INITIAL   6.  Further functional goals to be assessed Baseline:  Goal status: INITIAL   PLAN:   PT FREQUENCY: 2x/week   PT DURATION: 8 weeks   PLANNED INTERVENTIONS: Therapeutic exercises, Therapeutic activity, Neuromuscular re-education, Balance training, Gait training, Patient/Family education, Self Care, Joint mobilization, Spinal mobilization, Cryotherapy, Moist heat, Manual therapy, and Re-evaluation.Pilates    PLAN FOR NEXT SESSION: Recumbent bike, check HEP, hip hinging and core stabilization.  Consider Pilates. CONSIDER LARGE BALL For sitting at computer. Assess response to postural HEP and progress     Jannette Spanner, PTA 06/22/22 11:37 AM Phone: [phone] Fax: [phone]

## 2022-06-25 ENCOUNTER — Ambulatory Visit: Payer: BC Managed Care – PPO | Admitting: Physical Therapy

## 2022-06-25 ENCOUNTER — Encounter: Payer: Self-pay | Admitting: Physical Therapy

## 2022-06-25 DIAGNOSIS — M5459 Other low back pain: Secondary | ICD-10-CM | POA: Diagnosis not present

## 2022-06-25 DIAGNOSIS — M25651 Stiffness of right hip, not elsewhere classified: Secondary | ICD-10-CM

## 2022-06-25 DIAGNOSIS — M25652 Stiffness of left hip, not elsewhere classified: Secondary | ICD-10-CM

## 2022-06-25 NOTE — Therapy (Signed)
OUTPATIENT PHYSICAL THERAPY TREATMENT NOTE   Patient Name: Mason Vargas MRN: [id number] DOB:[birth year], 67 y.o., male Today's Date: 06/25/2022  PCP: Jacquiline Doe MD    REFERRING PROVIDER: Tressie Stalker, MD  END OF SESSION:   PT End of Session - 06/25/22 0800     Visit Number 6    Number of Visits 16    Date for PT Re-Evaluation 07/15/22    Authorization Type [employer]    PT Start Time 0800    PT Stop Time 0845    PT Time Calculation (min) 45 min              Past Medical History:  Diagnosis Date   GERD (gastroesophageal reflux disease)    on meds   Hyperlipidemia    on meds   Hypertension    on meds   Insomnia    Lumbar herniated disc    Sciatica 04/08/2018   Left leg   Snores    Past Surgical History:  Procedure Laterality Date   CLOSED REDUCTION NASAL FRACTURE N/A 05/26/2018   Procedure: CLOSED REDUCTION NASAL FRACTURE;  Surgeon: Drema Halon, MD;  Location: [facility];  Service: ENT;  Laterality: N/A;   COLONOSCOPY W/ POLYPECTOMY  2014   LUMBAR DISC SURGERY     SPINE SURGERY  05/13/2020   WISDOM TOOTH EXTRACTION     Patient Active Problem List   Diagnosis Date Noted   Aortic dilatation (HCC) 07/02/2021   Insomnia 04/22/2020   SVT (supraventricular tachycardia) 03/11/2020   Low back pain 10/31/2019   Abnormal chest x-ray 02/24/2019   Radiculopathy, lumbar region 09/23/2018   Atrophy of quadriceps femoris muscle 09/13/2018   GERD (gastroesophageal reflux disease) 03/22/2018   Hypertension 03/22/2018   Lumbar herniated disc 03/22/2014   BPH (benign prostatic hyperplasia) 03/03/2012   Family history of ischemic heart disease (IHD) 08/10/2011   Colon adenoma 06/11/2011   Vitamin D deficiency 06/11/2011   ED (erectile dysfunction) 06/02/2011   Hyperlipidemia 03/22/2002    REFERRING DIAG: M51.16 (ICD-10-CM) - Intervertebral disc disorders with radiculopathy, lumbar region  THERAPY DIAG:  Other low back pain  Stiffness of  right hip, not elsewhere classified  Stiffness of left hip, not elsewhere classified  Rationale for Evaluation and Treatment Rehabilitation  PERTINENT HISTORY:  See above Cardiac history   PRECAUTIONS: None and Other: arrythmia -caffeine related   SUBJECTIVE:                                                                                                                                                                                      SUBJECTIVE STATEMENT:   Had some shooting pains in  the back of my leg when I was driving. Now the area is sore. I have pain in left buttock.     PAIN:  Are you having pain? Yes: NPRS scale: 2/10 Pain location: L side L5  Pain description: sore  Aggravating factors: sitting  Relieving factors: MHP, Aleve/meds, Lying supine  End of the day, after sitting pain can be 6/10-9/10   OBJECTIVE: (objective measures completed at initial evaluation unless otherwise dated)   DIAGNOSTIC FINDINGS:  XR done Dr. [name] -normal    PATIENT SURVEYS:  FOTO 46% FOTO 45% 06/22/22   SCREENING FOR RED FLAGS: Bowel or bladder incontinence: No Spinal tumors: No Cauda equina syndrome: No Compression fracture: No Abdominal aneurysm: No   COGNITION: Overall cognitive status: Within functional limits for tasks assessed                          SENSATION: WFL   MUSCLE LENGTH: Hamstrings: 90/90 Right 30+ deg; Left 40+ deg [name] test: Right tight  deg; Left tighter deg   POSTURE: rounded shoulders, forward head, and decreased lumbar lordosis   PALPATION: Pain in bilateral lumbar region left side >  right.  Palpation to right L4-5 and superior glutes increased pain on the left side.   Spasm left piriformis Leg LUMBAR ROM:    AROM eval  Flexion 25% limited   Extension 50% limited  Right lateral flexion WFL  Left lateral flexion WFL  Right rotation 25% limited   Left rotation 25% limited    (Blank rows = not tested)   LOWER EXTREMITY ROM:       PROM  Right eval Left eval  Hip flexion WNL  WNL  Hip extension      Hip abduction      Hip adduction      Hip internal rotation 45 deg  20-25 deg   Hip external rotation Min restriction Min restriction   Knee flexion WNL WNL  Knee extension WNL  WNL  Ankle dorsiflexion      Ankle plantarflexion      Ankle inversion      Ankle eversion       (Blank rows = not tested)   LOWER EXTREMITY MMT:     MMT Right eval Left eval  Hip flexion 5/5 4/5  Hip extension      Hip abduction 4+/5 4/5  Hip adduction      Hip internal rotation      Hip external rotation      Knee flexion 4+/5 4/5  Knee extension 5/5 4/5  Ankle dorsiflexion 5/5 5/5  Ankle plantarflexion NT NT  Ankle inversion      Ankle eversion       (Blank rows = not tested)   LUMBAR SPECIAL TESTS:  Straight leg raise test: Negative and Slump test: Positive   FUNCTIONAL TESTS:  5 times sit to stand: NT   GAIT: Distance walked: 150 Assistive device utilized: None Level of assistance: Complete Independence Comments: min limp, favoring L side, shortened stance time on L side.    TODAY'S TREATMENT:                 [facility] Adult PT Treatment:  DATE: 06/25/22 Therapeutic Exercise: Seated large Ball exercises: as option during work at [employer] with band , green  90/90- toe taps, initial increased pain, then resolved  Standing Free motion pull down 17# 10 x 2  Palloff press 7# 10 x 2 each  Figure 4 supine and seated  Supine clam with green band   Therapeutic Activity: Seated and standing hip hinge x 8 each , no lifting     [facility] Adult PT Treatment:                                                DATE: 06/22/22 Therapeutic Exercise: Upper trap stretch Levator stretch Scap retract Chin tuck   Sit-Fit balance trial for seated pelvic mobility- consider large ball for sitting? Therapeutic Activity: FOTO Hip hinge with dowel seated- mod cues Hip hinge with dowel  standing- mod cues    [facility] Adult PT Treatment:                                                DATE: 06/05/22 Therapeutic Exercise: Neutral spine Tr A x 10 activation  Lumbar stab I : march,  Bridge x 10  90/90 5 sec x 10  Bridge x 10 Lumbar stab bent knee fall outs, bil. Single and resisted  Cat camel Qped progressing to bird dogs Hip flexor stretches      [facility] Adult PT Treatment:                                                DATE: 06/02/22 Therapeutic Exercise: Recumbent bike L3 for 5 min  HEP check: knee to chest , knee to opp shoulder, LTR , supine figure 4 (better in sitting)  Bridging x 10 , added exhale to lift for abdominal support  Ball under pelvis for A/P tilting and awareness Neutral spine Tr A x 10 activation  Lumbar stab I : march, clam and SLR  Sit to stand hinge with yardstick cues Quadruped hinge on Reformer   Reformer exercises: Quadruped 1 red then 1 blue x 10 each (plank on knees) Bird dog x  8  Supine arm Arcs 1 red 1 Yellow x 10  Footwork 2 red 1 blue parallel heels, turnout about x 15 each  Heel raises and calf stretch 2 red 1 blue                                                                                                               [facility] Adult PT Treatment:  DATE: 05/21/22 Therapeutic Exercise: Nustep L5 UE/LE x 5 minutes  Seated figure 4 push and pull  SKTC LTR 90/90- increased back pain SLR with ab brace x 10 SLR (ER) with ab brace x 10  Hamstring stretch supine with strap  Bridge x 10 Left/right  hip flexor stretch EOM Seated option for hamstring stretch     DATE: 05/20/22  EVAL     PATIENT EDUCATION:  Education details: PT, POC, HEP  and core  Person educated: Patient Education method: Programmer, multimedia, Demonstration, and Handouts Education comprehension: verbalized understanding, returned demonstration, and needs further education   HOME EXERCISE PROGRAM: Access Code: [email or web address] URL:  [email or web address] Date: 05/20/2022 Prepared by: Karie Mainland   Exercises - Supine Single Knee to Chest Stretch  - 1-2 x daily - 7 x weekly - 1 sets - 3-5 reps - 30 hold - Seated Figure 4 Piriformis Stretch  - 1-2 x daily - 7 x weekly - 1 sets - 3-5 reps - 30 hold - Supine Lower Trunk Rotation  - 1-2 x daily - 7 x weekly - 1 sets - 10 reps - 10 hold - Supine 90/90 Abdominal Bracing  - 1 x daily - 7 x weekly - 1 sets - 3-5 reps - 30 hold 05/21/22 - Active straight leg raise  - 1 x daily - 7 x weekly - 1-2 sets - 10 reps - 3 hold - Supine Bridge  - 1 x daily - 7 x weekly - 1-2 sets - 10 reps - 5 hold - Seated Hamstring Stretch  - 1 x daily - 7 x weekly - 1 sets - 3 reps - 20-30 hold 06/05/22 - Bird Dog  - 1 x daily - 7 x weekly - 1 sets - 10 reps - 5-10 hold - Modified Thomas Stretch  - 1 x daily - 7 x weekly - 1 sets - 3 reps - 30 hold 06/22/22 - Seated Upper Trapezius Stretch  - 1 x daily - 7 x weekly - 3 sets - 10 reps - Gentle Levator Scapulae Stretch  - 1 x daily - 7 x weekly - 3 sets - 10 reps - Seated Scapular Retraction  - 1 x daily - 7 x weekly - 3 sets - 10 reps - Seated Cervical Retraction  - 1 x daily - 7 x weekly - 1-2 sets - 10 reps - 5 hold - Hip hinge with dowel as needed for alignment  - 1 x daily - 7 x weekly - 1-2 sets - 10 reps - Seated Hip Hinge with Dowel  - 1 x daily - 7 x weekly - 1-2 sets - 10 reps   ASSESSMENT:   CLINICAL IMPRESSION: Pt reports he is sore in buttock and leg today after having pains in his leg while driving yesterday. This is a new pain. He was instructed in Figure 4 and hamstring stretches which he does demonstrate increased tightness on left compared to right. Introduced large exercise ball as an option for seated core exercises he can do while sitting for work. He did well with these and was challenged in narrow stance. He reports compliance with new postural exercises. He has one week left in POC and should be appropriate for  discharge at that time if still doing well.       OBJECTIVE IMPAIRMENTS: Abnormal gait, decreased mobility, difficulty walking, decreased ROM, decreased strength, increased fascial restrictions, impaired flexibility, and pain.    ACTIVITY LIMITATIONS: lifting, bending, sitting, standing, squatting, sleeping, stairs, and  locomotion level   PARTICIPATION LIMITATIONS: interpersonal relationship, driving, shopping, community activity, occupation, and yard work   PERSONAL FACTORS: Past/current experiences, Time since onset of injury/illness/exacerbation, and 1-2 comorbidities: Previous back surgery,  are also affecting patient's functional outcome.    REHAB POTENTIAL: Excellent   CLINICAL DECISION MAKING: Stable/uncomplicated   EVALUATION COMPLEXITY: Low     GOALS: Goals reviewed with patient? Yes   SHORT TERM GOALS: Target date: 06/17/2022     Pt will be I with HEP for core and hip flexibility  Baseline: Goal status: MET   2.  Pt will be able to acquire better seating, positioning to improve comfort at work  Baseline:  06/22/22: aquired lumbar support pillow, not have 90% improvement in sitting tolerance Goal status: MET   3.  Pt will be able to understand posture and hinging to preserve back with lifting and ADLs.  Baseline:  Goal status: improving   4.  Patient will complete functional testing (for balance, strength, dynamic gait) and set goal Baseline:  Goal status: INITIAL       LONG TERM GOALS: Target date: 07/15/2022       Patient will improve Foto score to 56% or greater to demonstrate improved functional ability upon discharge Baseline: 46% 06/22/22: 45%  Goal status: ONGOING   2.  Patient will be able to walk a mile without increasing back pain Baseline:  06/22/22: unable to walk 1 full mile, maybe half 06/25/22: walked a mile this week and had no increased pain.  Goal status: MET   3.  Patient will be able to sit as long as he needs to for work meetings up to  4 hours with min increase in back pain Baseline:  06/22/22:  aquired lumbar support pillow, not have 90% improvement in sitting tolerance Goal status: MET   4.  Patient will safely squat and/or deadlift 25 pounds without increased back pain Baseline:  Goal status: Pt reports he was told by surgeon never to lift more than 10# / DEFERRED?    5.  Patient will be independent with home exercise program upon discharge Baseline:  Goal status: INITIAL   6.  Further functional goals to be assessed Baseline:  Goal status: INITIAL   PLAN:   PT FREQUENCY: 2x/week   PT DURATION: 8 weeks   PLANNED INTERVENTIONS: Therapeutic exercises, Therapeutic activity, Neuromuscular re-education, Balance training, Gait training, Patient/Family education, Self Care, Joint mobilization, Spinal mobilization, Cryotherapy, Moist heat, Manual therapy, and Re-evaluation.Pilates    PLAN FOR NEXT SESSION: Recumbent bike, check HEP, hip hinging and core stabilization.  Consider Pilates. Assess response to postural HEP and progress     Jannette Spanner, PTA 06/25/22 12:08 PM Phone: [phone] Fax: [phone]

## 2022-06-29 ENCOUNTER — Encounter: Payer: Self-pay | Admitting: Family Medicine

## 2022-06-29 ENCOUNTER — Ambulatory Visit: Payer: BC Managed Care – PPO | Admitting: Physical Therapy

## 2022-06-29 ENCOUNTER — Encounter: Payer: Self-pay | Admitting: Physical Therapy

## 2022-06-29 DIAGNOSIS — M25652 Stiffness of left hip, not elsewhere classified: Secondary | ICD-10-CM

## 2022-06-29 DIAGNOSIS — M25651 Stiffness of right hip, not elsewhere classified: Secondary | ICD-10-CM

## 2022-06-29 DIAGNOSIS — M5459 Other low back pain: Secondary | ICD-10-CM | POA: Diagnosis not present

## 2022-06-29 NOTE — Telephone Encounter (Signed)
Please advise 

## 2022-06-29 NOTE — Therapy (Signed)
OUTPATIENT PHYSICAL THERAPY TREATMENT NOTE DISCHARGE   Patient Name: Mason Vargas MRN: [id number] DOB:11/10/1955, 67 y.o., male Today's Date: 06/29/2022  PCP: Jacquiline Doe MD    REFERRING PROVIDER: Tressie Stalker, MD  END OF SESSION:   PT End of Session - 06/29/22 0802     Visit Number 7    Number of Visits 16    Date for PT Re-Evaluation 07/15/22    Authorization Type BCBS    PT Start Time 0802    PT Stop Time 0845    PT Time Calculation (min) 43 min              Past Medical History:  Diagnosis Date   GERD (gastroesophageal reflux disease)    on meds   Hyperlipidemia    on meds   Hypertension    on meds   Insomnia    Lumbar herniated disc    Sciatica 04/08/2018   Left leg   Snores    Past Surgical History:  Procedure Laterality Date   CLOSED REDUCTION NASAL FRACTURE N/A 05/26/2018   Procedure: CLOSED REDUCTION NASAL FRACTURE;  Surgeon: Drema Halon, MD;  Location: [facility];  Service: ENT;  Laterality: N/A;   COLONOSCOPY W/ POLYPECTOMY  2014   LUMBAR DISC SURGERY     SPINE SURGERY  05/13/2020   WISDOM TOOTH EXTRACTION     Patient Active Problem List   Diagnosis Date Noted   Aortic dilatation (HCC) 07/02/2021   Insomnia 04/22/2020   SVT (supraventricular tachycardia) 03/11/2020   Low back pain 10/31/2019   Abnormal chest x-ray 02/24/2019   Radiculopathy, lumbar region 09/23/2018   Atrophy of quadriceps femoris muscle 09/13/2018   GERD (gastroesophageal reflux disease) 03/22/2018   Hypertension 03/22/2018   Lumbar herniated disc 03/22/2014   BPH (benign prostatic hyperplasia) 03/03/2012   Family history of ischemic heart disease (IHD) 08/10/2011   Colon adenoma 06/11/2011   Vitamin D deficiency 06/11/2011   ED (erectile dysfunction) 06/02/2011   Hyperlipidemia 03/22/2002    REFERRING DIAG: M51.16 (ICD-10-CM) - Intervertebral disc disorders with radiculopathy, lumbar region  THERAPY DIAG:  Other low back  pain  Stiffness of right hip, not elsewhere classified  Stiffness of left hip, not elsewhere classified  Rationale for Evaluation and Treatment Rehabilitation  PERTINENT HISTORY:  See above Cardiac history   PRECAUTIONS: None and Other: arrythmia -caffeine related   SUBJECTIVE:                                                                                                                                                                                      SUBJECTIVE STATEMENT:   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      PAIN:  Are you having pain? Yes: NPRS scale: 1/10 Pain location: L side L5  Pain description: discomfort  Aggravating factors: sitting  Relieving factors: MHP, Aleve/meds, Lying supine  End of the day, after sitting pain can be 6/10-9/10   OBJECTIVE: (objective measures completed at initial evaluation unless otherwise dated)   DIAGNOSTIC FINDINGS:  XR done Dr. [name] -normal    PATIENT SURVEYS:  FOTO 46% FOTO 45% 06/22/22   SCREENING FOR RED FLAGS: Bowel or bladder incontinence: No Spinal tumors: No Cauda equina syndrome: No Compression fracture: No Abdominal aneurysm: No   COGNITION: Overall cognitive status: Within functional limits for tasks assessed                          SENSATION: WFL   MUSCLE LENGTH: Hamstrings: 90/90 Right 30+ deg; Left 40+ deg [name] test: Right tight  deg; Left tighter deg   POSTURE: rounded shoulders, forward head, and decreased lumbar lordosis   PALPATION: Pain in bilateral lumbar region left side >  right.  Palpation to right L4-5 and superior glutes increased pain on the left side.   Spasm left piriformis Leg LUMBAR ROM:    AROM eval 06/29/22  Flexion 25% limited  25% limited   Extension 50% limited 25%  Right lateral flexion WFL   Left lateral flexion WFL   Right rotation 25% limited  25% limited  Left rotation 25% limited  25% limited    (Blank rows = not tested)   LOWER EXTREMITY ROM:       PROM  Right eval Left eval  Hip flexion WNL  WNL  Hip extension      Hip abduction      Hip adduction      Hip internal rotation 45 deg  20-25 deg   Hip external rotation Min restriction Min restriction   Knee flexion WNL WNL  Knee extension WNL  WNL  Ankle dorsiflexion      Ankle plantarflexion      Ankle inversion      Ankle eversion       (Blank rows = not tested)   LOWER EXTREMITY MMT:     MMT Right eval Left eval Left 06/29/22  Hip flexion 5/5 4/5 4/5  Hip extension       Hip abduction 4+/5 4/5   Hip adduction       Hip internal rotation       Hip external rotation       Knee flexion 4+/5 4/5   Knee extension 5/5 4/5   Ankle dorsiflexion 5/5 5/5   Ankle plantarflexion NT NT   Ankle inversion       Ankle eversion        (Blank rows = not tested)   LUMBAR SPECIAL TESTS:  Straight leg raise test: Negative and Slump test: Positive   FUNCTIONAL TESTS:  5 times sit to stand: NT   GAIT: Distance walked: 150 Assistive device utilized: None Level of assistance: Complete Independence Comments: min limp, favoring L side, shortened stance time on L side.    TODAY'S TREATMENT:                 [facility] Adult PT Treatment:  DATE: 06/29/22 Therapeutic Exercise: 20# pulldown at free motion 10# palloff press x 10 each Blue band pull downs at door  Double Blue band palloff press  Ab isometric with ball to mini crunch  90/90 hold 30 sec , 20 se, 15 sec  Bridge x 15 L SLR  Bird dogs  Reviewed HEP Updated HEP      [facility] Adult PT Treatment:                                                DATE: 06/25/22 Therapeutic Exercise: Seated large Ball exercises: as option during work at [employer] with band , green  90/90- toe taps, initial increased pain, then resolved  Standing Free motion pull down 17# 10 x 2  Palloff press 7# 10 x 2 each  Figure 4 supine and seated  Supine clam with green band   Therapeutic  Activity: Seated and standing hip hinge x 8 each , no lifting     [facility] Adult PT Treatment:                                                DATE: 06/22/22 Therapeutic Exercise: Upper trap stretch Levator stretch Scap retract Chin tuck   Sit-Fit balance trial for seated pelvic mobility- consider large ball for sitting? Therapeutic Activity: FOTO Hip hinge with dowel seated- mod cues Hip hinge with dowel standing- mod cues    [facility] Adult PT Treatment:                                                DATE: 06/05/22 Therapeutic Exercise: Neutral spine Tr A x 10 activation  Lumbar stab I : march,  Bridge x 10  90/90 5 sec x 10  Bridge x 10 Lumbar stab bent knee fall outs, bil. Single and resisted  Cat camel Qped progressing to bird dogs Hip flexor stretches      [facility] Adult PT Treatment:                                                DATE: 06/02/22 Therapeutic Exercise: Recumbent bike L3 for 5 min  HEP check: knee to chest , knee to opp shoulder, LTR , supine figure 4 (better in sitting)  Bridging x 10 , added exhale to lift for abdominal support  Ball under pelvis for A/P tilting and awareness Neutral spine Tr A x 10 activation  Lumbar stab I : march, clam and SLR  Sit to stand hinge with yardstick cues Quadruped hinge on Reformer   Reformer exercises: Quadruped 1 red then 1 blue x 10 each (plank on knees) Bird dog x  8  Supine arm Arcs 1 red 1 Yellow x 10  Footwork 2 red 1 blue parallel heels, turnout about x 15 each  Heel raises and calf stretch 2 red 1 blue  [facility] Adult PT Treatment:                                                DATE: 05/21/22 Therapeutic Exercise: Nustep L5 UE/LE x 5 minutes  Seated figure 4 push and pull  SKTC LTR 90/90- increased back pain SLR with ab brace x 10 SLR (ER) with ab brace x 10  Hamstring stretch supine with strap  Bridge x  10 Left/right  hip flexor stretch EOM Seated option for hamstring stretch     DATE: 05/20/22  EVAL     PATIENT EDUCATION:  Education details: PT, POC, HEP  and core  Person educated: Patient Education method: Programmer, multimedia, Demonstration, and Handouts Education comprehension: verbalized understanding, returned demonstration, and needs further education   HOME EXERCISE PROGRAM: Access Code: [email or web address] URL: [email or web address] Date: 05/20/2022 Prepared by: Karie Mainland   Exercises - Supine Single Knee to Chest Stretch  - 1-2 x daily - 7 x weekly - 1 sets - 3-5 reps - 30 hold - Seated Figure 4 Piriformis Stretch  - 1-2 x daily - 7 x weekly - 1 sets - 3-5 reps - 30 hold - Supine Lower Trunk Rotation  - 1-2 x daily - 7 x weekly - 1 sets - 10 reps - 10 hold - Supine 90/90 Abdominal Bracing  - 1 x daily - 7 x weekly - 1 sets - 3-5 reps - 30 hold 05/21/22 - Active straight leg raise  - 1 x daily - 7 x weekly - 1-2 sets - 10 reps - 3 hold - Supine Bridge  - 1 x daily - 7 x weekly - 1-2 sets - 10 reps - 5 hold - Seated Hamstring Stretch  - 1 x daily - 7 x weekly - 1 sets - 3 reps - 20-30 hold 06/05/22 - Bird Dog  - 1 x daily - 7 x weekly - 1 sets - 10 reps - 5-10 hold - Modified Thomas Stretch  - 1 x daily - 7 x weekly - 1 sets - 3 reps - 30 hold 06/22/22 - Seated Upper Trapezius Stretch  - 1 x daily - 7 x weekly - 3 sets - 10 reps - Gentle Levator Scapulae Stretch  - 1 x daily - 7 x weekly - 3 sets - 10 reps - Seated Scapular Retraction  - 1 x daily - 7 x weekly - 3 sets - 10 reps - Seated Cervical Retraction  - 1 x daily - 7 x weekly - 1-2 sets - 10 reps - 5 hold - Hip hinge with dowel as needed for alignment  - 1 x daily - 7 x weekly - 1-2 sets - 10 reps - Seated Hip Hinge with Dowel  - 1 x daily - 7 x weekly - 1-2 sets - 10 reps 06/29/22    ASSESSMENT:   CLINICAL IMPRESSION: Pt reports his pain is low level. He is tolerating ADLs and yardwork with improved tolerance. He  is mindful of body mechanics utilizing hip hinge. He is more aware of posture with sitting job and is incorporating postural exercises and stretches. He has returned to walking 1 mile at at time without increased pain.  At this time he has achieved most LTGs and has reached the end of his POC. He agrees with discharge to HEP to continue building core strength and stability.  OBJECTIVE IMPAIRMENTS: Abnormal gait, decreased mobility, difficulty walking, decreased ROM, decreased strength, increased fascial restrictions, impaired flexibility, and pain.    ACTIVITY LIMITATIONS: lifting, bending, sitting, standing, squatting, sleeping, stairs, and locomotion level   PARTICIPATION LIMITATIONS: interpersonal relationship, driving, shopping, community activity, occupation, and yard work   PERSONAL FACTORS: Past/current experiences, Time since onset of injury/illness/exacerbation, and 1-2 comorbidities: Previous back surgery,  are also affecting patient's functional outcome.    REHAB POTENTIAL: Excellent   CLINICAL DECISION MAKING: Stable/uncomplicated   EVALUATION COMPLEXITY: Low     GOALS: Goals reviewed with patient? Yes   SHORT TERM GOALS: Target date: 06/17/2022     Pt will be I with HEP for core and hip flexibility  Baseline: Goal status: MET   2.  Pt will be able to acquire better seating, positioning to improve comfort at work  Baseline:  06/22/22: aquired lumbar support pillow, not have 90% improvement in sitting tolerance Goal status: MET   3.  Pt will be able to understand posture and hinging to preserve back with lifting and ADLs.  Baseline:  Goal status: improving   4.  Patient will complete functional testing (for balance, strength, dynamic gait) and set goal Baseline:  Goal status: MET       LONG TERM GOALS: Target date: 07/15/2022       Patient will improve Foto score to 56% or greater to demonstrate improved functional ability upon discharge Baseline:  46% 06/22/22: 55%  06/29/22: 67%  Goal status: MET    2.  Patient will be able to walk a mile without increasing back pain Baseline:  06/22/22: unable to walk 1 full mile, maybe half 06/25/22: walked a mile this week and had no increased pain.  Goal status: MET   3.  Patient will be able to sit as long as he needs to for work meetings up to 4 hours with min increase in back pain Baseline:  06/22/22:  aquired lumbar support pillow, not have 90% improvement in sitting tolerance Goal status: MET   4.  Patient will safely squat and/or deadlift 25 pounds without increased back pain Baseline:  Goal status: Pt reports he was told by surgeon never to lift more than 10# / DEFERRED -is able to hip hinge correctly   5.  Patient will be independent with home exercise program upon discharge Baseline:  Goal status: MET   6.  Further functional goals to be assessed Baseline:  Goal status: DEFERRED    PLAN:   PT FREQUENCY: 2x/week   PT DURATION: 8 weeks   PLANNED INTERVENTIONS: Therapeutic exercises, Therapeutic activity, Neuromuscular re-education, Balance training, Gait training, Patient/Family education, Self Care, Joint mobilization, Spinal mobilization, Cryotherapy, Moist heat, Manual therapy, and Re-evaluation.Pilates    PLAN FOR NEXT SESSION: Recumbent bike, check HEP, hip hinging and core stabilization.  Consider Pilates. Assess response to postural HEP and progress     Jannette Spanner, PTA 06/29/22 11:46 AM Phone: [phone] Fax: [phone]      PHYSICAL THERAPY DISCHARGE SUMMARY  Visits from Start of Care: 7  Current functional level related to goals / functional outcomes: See above for most recent    Remaining deficits: None limiting function   Education / Equipment: HEP, core , posture and body mechanics    Patient agrees to discharge. Patient goals were met. Patient is being discharged due to being pleased with the current functional level.  Karie Mainland,  PT 06/30/22 8:02 AM Phone: [phone] Fax: [phone]

## 2022-06-30 ENCOUNTER — Other Ambulatory Visit: Payer: Self-pay | Admitting: *Deleted

## 2022-06-30 DIAGNOSIS — K219 Gastro-esophageal reflux disease without esophagitis: Secondary | ICD-10-CM

## 2022-06-30 MED ORDER — OMEPRAZOLE 20 MG PO CPDR
20.0000 mg | DELAYED_RELEASE_CAPSULE | Freq: Two times a day (BID) | ORAL | 3 refills | Status: DC
Start: 2022-06-30 — End: 2023-06-04

## 2022-06-30 NOTE — Telephone Encounter (Signed)
Ok to send in new prescription for twice daily.  Mason Vargas. [name], MD 06/30/2022 12:30 PM

## 2022-07-01 ENCOUNTER — Ambulatory Visit: Payer: BC Managed Care – PPO | Admitting: Physical Therapy

## 2022-08-26 ENCOUNTER — Other Ambulatory Visit: Payer: Self-pay | Admitting: Family Medicine

## 2022-09-28 ENCOUNTER — Encounter: Payer: Self-pay | Admitting: Family Medicine

## 2022-09-28 NOTE — Telephone Encounter (Signed)
Can we have him schedule an appointment to discuss?  Katina Degree. [name], MD 09/28/2022 10:28 AM

## 2022-09-28 NOTE — Telephone Encounter (Signed)
Please advise 

## 2022-10-01 ENCOUNTER — Telehealth: Payer: BC Managed Care – PPO | Admitting: Family Medicine

## 2022-10-01 ENCOUNTER — Encounter: Payer: Self-pay | Admitting: Family Medicine

## 2022-10-01 VITALS — Temp 99.6°F | Ht 72.0 in | Wt 179.0 lb

## 2022-10-01 DIAGNOSIS — E785 Hyperlipidemia, unspecified: Secondary | ICD-10-CM

## 2022-10-01 DIAGNOSIS — N529 Male erectile dysfunction, unspecified: Secondary | ICD-10-CM

## 2022-10-01 DIAGNOSIS — U071 COVID-19: Secondary | ICD-10-CM | POA: Diagnosis not present

## 2022-10-01 DIAGNOSIS — G4700 Insomnia, unspecified: Secondary | ICD-10-CM

## 2022-10-01 DIAGNOSIS — I10 Essential (primary) hypertension: Secondary | ICD-10-CM | POA: Diagnosis not present

## 2022-10-01 MED ORDER — TADALAFIL 5 MG PO TABS: 5.0000 mg | ORAL_TABLET | Freq: Every day | ORAL | 3 refills | Status: DC

## 2022-10-01 MED ORDER — NIRMATRELVIR/RITONAVIR (PAXLOVID)TABLET: 3.0000 | ORAL_TABLET | Freq: Two times a day (BID) | ORAL | 0 refills | Status: AC

## 2022-10-01 MED ORDER — TRAZODONE HCL 50 MG PO TABS: 25.0000 mg | ORAL_TABLET | Freq: Every evening | ORAL | 3 refills | Status: DC | PRN

## 2022-10-01 NOTE — Assessment & Plan Note (Signed)
Longstanding problem but uncontrolled recently.  We discussed sleep hygiene measures.  He is already doing a great job with most of these strategies.  Will restart trazodone 25 to 50 mg daily.  He has done well this previously.  He will follow-up me in a few weeks if not improving.

## 2022-10-01 NOTE — Progress Notes (Signed)
   Mason Vargas is a 67 y.o. male who presents today for a virtual office visit.  Assessment/Plan:  New/Acute Problems: COVID No red flags however symptoms are worsening and he does have a few risk factors.  We will start Paxlovid.  Instructed him to stop his Lipitor while he is on this.  Encouraged hydration.  Can use over-the-counter meds as needed as well.  We discussed reasons return to care or seek emergent care.  He will let us know if not improving in the next few days.  Chronic Problems Addressed Today: Insomnia Longstanding problem but uncontrolled recently.  We discussed sleep hygiene measures.  He is already doing a great job with most of these strategies.  Will restart trazodone 25 to 50 mg daily.  He has done well this previously.  He will follow-up me in a few weeks if not improving.  Hyperlipidemia On Lipitor 20 mg daily.  He will hold this while on Paxlovid.  ED (erectile dysfunction) Stable on Cialis 5 mg daily.  Will refill today.     Subjective:  HPI:  See Assessment / plan for status of chronic conditions.  He is here today with COVID.  Symptoms include cough, congestion, sore throat, malaise, and body aches. Symptoms started 3 days ago. Initially testing was negative however test was positive yesterday. Symptoms seem to be getting worse.  No reported chest pain or shortness of breath.  He also having some issues with waking up in the middle of the night with racing thoughts. He has had intermittent issues with insomnia in the past.  Previously trazodone worked well.  Usually has racing thoughts about work.  Wakes up to urinate and then has trouble going back to sleep.  Usually is falling asleep without any difficulty.  Typically feels tired the next day when he only gets 5 to 6 hours sleep.  Ideally likes to get around 8 hours of sleep at night.       Objective/Observations  Physical Exam: Gen: NAD, resting comfortably Pulm: Normal work of breathing Neuro: Grossly  normal, moves all extremities Psych: Normal affect and thought content  Virtual Visit via Video   I connected with Rachel Moulds on 10/01/22 at  7:20 AM EDT by a video enabled telemedicine application and verified that I am speaking with the correct person using two identifiers. The limitations of evaluation and management by telemedicine and the availability of in person appointments were discussed. The patient expressed understanding and agreed to proceed.   Patient location: Home Provider location: [place] [employer] [facility] Persons participating in the virtual visit: Myself and Patient     Katina Degree. [name], MD 10/01/2022 7:42 AM

## 2022-10-01 NOTE — Assessment & Plan Note (Signed)
Stable on Cialis 5 mg daily.  Will refill today.

## 2022-10-01 NOTE — Assessment & Plan Note (Signed)
On Lipitor 20 mg daily.  He will hold this while on Paxlovid.

## 2022-11-27 ENCOUNTER — Other Ambulatory Visit: Payer: Self-pay | Admitting: Family Medicine

## 2023-01-27 ENCOUNTER — Ambulatory Visit (HOSPITAL_COMMUNITY)
Admission: RE | Admit: 2023-01-27 | Discharge: 2023-01-27 | Disposition: A | Discharge status: Home / Self Care | Payer: Self-pay | Source: Ambulatory Visit | Attending: Internal Medicine | Admitting: Internal Medicine

## 2023-01-27 DIAGNOSIS — I77819 Aortic ectasia, unspecified site: Secondary | ICD-10-CM | POA: Insufficient documentation

## 2023-01-27 MED ORDER — IOHEXOL 350 MG/ML SOLN
75.0000 mL | Freq: Once | INTRAVENOUS | Status: AC | PRN
  Administered 2023-01-27: 75 mL via INTRAVENOUS

## 2023-02-02 ENCOUNTER — Encounter: Payer: Self-pay | Admitting: Internal Medicine

## 2023-02-27 ENCOUNTER — Other Ambulatory Visit: Payer: Self-pay | Admitting: Family Medicine

## 2023-05-24 ENCOUNTER — Other Ambulatory Visit: Payer: Self-pay | Admitting: Family Medicine

## 2023-05-24 DIAGNOSIS — E785 Hyperlipidemia, unspecified: Secondary | ICD-10-CM

## 2023-06-04 ENCOUNTER — Ambulatory Visit (INDEPENDENT_AMBULATORY_CARE_PROVIDER_SITE_OTHER): Payer: BC Managed Care – PPO | Admitting: Family Medicine

## 2023-06-04 ENCOUNTER — Encounter: Payer: Self-pay | Admitting: Family Medicine

## 2023-06-04 VITALS — BP 127/76 | HR 55 | Temp 97.9°F | Ht 72.0 in | Wt 182.6 lb

## 2023-06-04 DIAGNOSIS — N400 Enlarged prostate without lower urinary tract symptoms: Secondary | ICD-10-CM

## 2023-06-04 DIAGNOSIS — I10 Essential (primary) hypertension: Secondary | ICD-10-CM | POA: Diagnosis not present

## 2023-06-04 DIAGNOSIS — Z131 Encounter for screening for diabetes mellitus: Secondary | ICD-10-CM

## 2023-06-04 DIAGNOSIS — Z125 Encounter for screening for malignant neoplasm of prostate: Secondary | ICD-10-CM | POA: Diagnosis not present

## 2023-06-04 DIAGNOSIS — Z0001 Encounter for general adult medical examination with abnormal findings: Secondary | ICD-10-CM

## 2023-06-04 DIAGNOSIS — E559 Vitamin D deficiency, unspecified: Secondary | ICD-10-CM | POA: Diagnosis not present

## 2023-06-04 DIAGNOSIS — K219 Gastro-esophageal reflux disease without esophagitis: Secondary | ICD-10-CM

## 2023-06-04 DIAGNOSIS — N529 Male erectile dysfunction, unspecified: Secondary | ICD-10-CM

## 2023-06-04 DIAGNOSIS — E785 Hyperlipidemia, unspecified: Secondary | ICD-10-CM | POA: Diagnosis not present

## 2023-06-04 DIAGNOSIS — G4700 Insomnia, unspecified: Secondary | ICD-10-CM

## 2023-06-04 LAB — VITAMIN B12: Vitamin B-12: 423 pg/mL (ref 211–911)

## 2023-06-04 LAB — COMPREHENSIVE METABOLIC PANEL WITH GFR
ALT: 16 U/L (ref 0–53)
AST: 16 U/L (ref 0–37)
Albumin: 4.2 g/dL (ref 3.5–5.2)
Alkaline Phosphatase: 65 U/L (ref 39–117)
BUN: 16 mg/dL (ref 6–23)
CO2: 30 meq/L (ref 19–32)
Calcium: 9 mg/dL (ref 8.4–10.5)
Chloride: 103 meq/L (ref 96–112)
Creatinine, Ser: 0.8 mg/dL (ref 0.40–1.50)
GFR: 91.75 mL/min (ref >60.00)
Glucose, Bld: 96 mg/dL (ref 70–99)
Potassium: 3.6 meq/L (ref 3.5–5.1)
Sodium: 139 meq/L (ref 135–145)
Total Bilirubin: 2.4 mg/dL — ABNORMAL HIGH (ref 0.2–1.2)
Total Protein: 6.3 g/dL (ref 6.0–8.3)

## 2023-06-04 LAB — LIPID PANEL
Cholesterol: 106 mg/dL (ref 0–200)
HDL: 41.8 mg/dL (ref >39.00)
LDL Cholesterol: 52 mg/dL (ref 0–99)
NonHDL: 64.69
Total CHOL/HDL Ratio: 3
Triglycerides: 61 mg/dL (ref 0.0–149.0)
VLDL: 12.2 mg/dL (ref 0.0–40.0)

## 2023-06-04 LAB — PSA: PSA: 0.22 ng/mL (ref 0.10–4.00)

## 2023-06-04 LAB — HEMOGLOBIN A1C: Hgb A1c MFr Bld: 5.6 % (ref 4.6–6.5)

## 2023-06-04 LAB — CBC
HCT: 42.3 % (ref 39.0–52.0)
Hemoglobin: 14.6 g/dL (ref 13.0–17.0)
MCHC: 34.4 g/dL (ref 30.0–36.0)
MCV: 80.8 fl (ref 78.0–100.0)
Platelets: 153 10*3/uL (ref 150.0–400.0)
RBC: 5.24 Mil/uL (ref 4.22–5.81)
RDW: 13.5 % (ref 11.5–15.5)
WBC: 3.8 10*3/uL — ABNORMAL LOW (ref 4.0–10.5)

## 2023-06-04 LAB — VITAMIN D 25 HYDROXY (VIT D DEFICIENCY, FRACTURES): VITD: 33.84 ng/mL (ref 30.00–100.00)

## 2023-06-04 LAB — TSH: TSH: 1.74 u[IU]/mL (ref 0.35–5.50)

## 2023-06-04 MED ORDER — TRAZODONE HCL 50 MG PO TABS: 25.0000 mg | ORAL_TABLET | Freq: Every evening | ORAL | 3 refills | Status: AC | PRN

## 2023-06-04 MED ORDER — OMEPRAZOLE 20 MG PO CPDR
20.0000 mg | DELAYED_RELEASE_CAPSULE | Freq: Two times a day (BID) | ORAL | 3 refills | Status: AC
Start: 2023-06-04 — End: 2024-06-03

## 2023-06-04 MED ORDER — ATORVASTATIN CALCIUM 20 MG PO TABS
20.0000 mg | ORAL_TABLET | Freq: Every day | ORAL | 3 refills | Status: AC
Start: 2023-06-04 — End: 2024-06-03

## 2023-06-04 NOTE — Assessment & Plan Note (Signed)
Stable on trazodone 25 to 50 mg nightly as needed. ?

## 2023-06-04 NOTE — Assessment & Plan Note (Signed)
Stable on Cialis 5 mg daily.

## 2023-06-04 NOTE — Assessment & Plan Note (Signed)
Check lipids. He is on Lipitor 20 mg daily. Tolerating well.

## 2023-06-04 NOTE — Patient Instructions (Addendum)
 It was very nice to see you today!  We will check labs today.  Please continue to work on diet and exercise.   I will refill your medications today.   We will see you back in a year for your next physical.   Return in about 1 year (around 06/03/2024) for Annual Physical.   Take care, Dr Daneil Dunker  PLEASE NOTE:  If you had any lab tests, please let us  know if you have not heard back within a few days. You may see your results on mychart before we have a chance to review them but we will give you a call once they are reviewed by us .   If we ordered any referrals today, please let us  know if you have not heard from their office within the next week.   If you had any urgent prescriptions sent in today, please check with the pharmacy within an hour of our visit to make sure the prescription was transmitted appropriately.   Please try these tips to maintain a healthy lifestyle:  Eat at least 3 REAL meals and 1-2 snacks per day.  Aim for no more than 5 hours between eating.  If you eat breakfast, please do so within one hour of getting up.   Each meal should contain half fruits/vegetables, one quarter protein, and one quarter carbs (no bigger than a computer mouse)  Cut down on sweet beverages. This includes juice, soda, and sweet tea.   Drink at least 1 glass of water with each meal and aim for at least 8 glasses per day  Exercise at least 150 minutes every week.    Preventive Care 82 Years and Older, Male Preventive care refers to lifestyle choices and visits with your health care provider that can promote health and wellness. Preventive care visits are also called wellness exams. What can I expect for my preventive care visit? Counseling During your preventive care visit, your health care provider may ask about your: Medical history, including: Past medical problems. Family medical history. History of falls. Current health, including: Emotional well-being. Home life and  relationship well-being. Sexual activity. Memory and ability to understand (cognition). Lifestyle, including: Alcohol, nicotine or tobacco, and drug use. Access to firearms. Diet, exercise, and sleep habits. Work and work Astronomer. Sunscreen use. Safety issues such as seatbelt and bike helmet use. Physical exam Your health care provider will check your: Height and weight. These may be used to calculate your BMI (body mass index). BMI is a measurement that tells if you are at a healthy weight. Waist circumference. This measures the distance around your waistline. This measurement also tells if you are at a healthy weight and may help predict your risk of certain diseases, such as type 2 diabetes and high blood pressure. Heart rate and blood pressure. Body temperature. Skin for abnormal spots. What immunizations do I need?  Vaccines are usually given at various ages, according to a schedule. Your health care provider will recommend vaccines for you based on your age, medical history, and lifestyle or other factors, such as travel or where you work. What tests do I need? Screening Your health care provider may recommend screening tests for certain conditions. This may include: Lipid and cholesterol levels. Diabetes screening. This is done by checking your blood sugar (glucose) after you have not eaten for a while (fasting). Hepatitis C test. Hepatitis B test. HIV (human immunodeficiency virus) test. STI (sexually transmitted infection) testing, if you are at risk. Lung cancer screening. Colorectal  cancer screening. Prostate cancer screening. Abdominal aortic aneurysm (AAA) screening. You may need this if you are a current or former smoker. Talk with your health care provider about your test results, treatment options, and if necessary, the need for more tests. Follow these instructions at home: Eating and drinking  Eat a diet that includes fresh fruits and vegetables, whole  grains, lean protein, and low-fat dairy products. Limit your intake of foods with high amounts of sugar, saturated fats, and salt. Take vitamin and mineral supplements as recommended by your health care provider. Do not drink alcohol if your health care provider tells you not to drink. If you drink alcohol: Limit how much you have to 0-2 drinks a day. Know how much alcohol is in your drink. In the [place] one drink equals one 12 oz bottle of beer (355 mL), one 5 oz glass of wine (148 mL), or one 1 oz glass of hard liquor (44 mL). Lifestyle Brush your teeth every morning and night with fluoride toothpaste. Floss one time each day. Exercise for at least 30 minutes 5 or more days each week. Do not use any products that contain nicotine or tobacco. These products include cigarettes, chewing tobacco, and vaping devices, such as e-cigarettes. If you need help quitting, ask your health care provider. Do not use drugs. If you are sexually active, practice safe sex. Use a condom or other form of protection to prevent STIs. Take aspirin  only as told by your health care provider. Make sure that you understand how much to take and what form to take. Work with your health care provider to find out whether it is safe and beneficial for you to take aspirin  daily. Ask your health care provider if you need to take a cholesterol-lowering medicine (statin). Find healthy ways to manage stress, such as: Meditation, yoga, or listening to music. Journaling. Talking to a trusted person. Spending time with friends and family. Safety Always wear your seat belt while driving or riding in a vehicle. Do not drive: If you have been drinking alcohol. Do not ride with someone who has been drinking. When you are tired or distracted. While texting. If you have been using any mind-altering substances or drugs. Wear a helmet and other protective equipment during sports activities. If you have firearms in your house, make sure  you follow all gun safety procedures. Minimize exposure to UV radiation to reduce your risk of skin cancer. What's next? Visit your health care provider once a year for an annual wellness visit. Ask your health care provider how often you should have your eyes and teeth checked. Stay up to date on all vaccines. This information is not intended to replace advice given to you by your health care provider. Make sure you discuss any questions you have with your health care provider. Document Revised: 06/26/2020 Document Reviewed: 06/26/2020 Elsevier Patient Education  2024 [employer].

## 2023-06-04 NOTE — Assessment & Plan Note (Signed)
 Check PSA.  On Cialis  5 mg daily.  Doing well with this.

## 2023-06-04 NOTE — Progress Notes (Signed)
 Chief Complaint:  Mason Vargas is a 68 y.o. male who presents today for his annual comprehensive physical exam.    Assessment/Plan:  New/Acute Problems: Leg Edema No red flags.  Likely secondary to venous insufficiency.  Has several scattered varicosities as well.  Recommended compression, elevation, and frequent ambulation.  Check labs today.  Chronic Problems Addressed Today: GERD (gastroesophageal reflux disease) Stable on omeprazole  nightly.  Will refill today.  Check labs.  Hypertension Well-controlled on losartan /HCTZ 50-12.5 once daily.  Vitamin D  deficiency Check vitamin D .  Hyperlipidemia Check lipids.  He is on Lipitor 20mg  daily.  Tolerating well.  Insomnia Stable on trazodone  25 to 50 mg nightly as needed.  BPH (benign prostatic hyperplasia) Check PSA.  On Cialis  5 mg daily.  Doing well with this.  ED (erectile dysfunction) Stable on Cialis  5 mg daily.  Preventative Healthcare: Check labs.  Due for colonoscopy next year.  Patient Counseling(The following topics were reviewed and/or handout was given):  -Nutrition: Stressed importance of moderation in sodium/caffeine intake, saturated fat and cholesterol, caloric balance, sufficient intake of fresh fruits, vegetables, and fiber.  -Stressed the importance of regular exercise.   -Substance Abuse: Discussed cessation/primary prevention of tobacco, alcohol, or other drug use; driving or other dangerous activities under the influence; availability of treatment for abuse.   -Injury prevention: Discussed safety belts, safety helmets, smoke detector, smoking near bedding or upholstery.   -Sexuality: Discussed sexually transmitted diseases, partner selection, use of condoms, avoidance of unintended pregnancy and contraceptive alternatives.   -Dental health: Discussed importance of regular tooth brushing, flossing, and dental visits.  -Health maintenance and immunizations reviewed. Please refer to Health maintenance  section.  Return to care in 1 year for next preventative visit.     Subjective:  HPI:  He has no acute complaints today. See assessment / plan for status of chronic conditions. HE has noticed more swelling in his feet the last couple of days.   Lifestyle Diet: Balanced. Plenty of fruits and vegetables.  Exercise: Limited due to back pain.      06/04/2023    8:53 AM  Depression screen PHQ 2/9  Decreased Interest 0  Down, Depressed, Hopeless 0  PHQ - 2 Score 0    There are no preventive care reminders to display for this patient.   ROS: Per HPI, otherwise a complete review of systems was negative.   PMH:  The following were reviewed and entered/updated in epic: Past Medical History:  Diagnosis Date   GERD (gastroesophageal reflux disease)    on meds   Hyperlipidemia    on meds   Hypertension    on meds   Insomnia    Lumbar herniated disc    Sciatica 04/08/2018   Left leg   Snores    Patient Active Problem List   Diagnosis Date Noted   Aortic dilatation (HCC) 07/02/2021   Insomnia 04/22/2020   SVT (supraventricular tachycardia) (HCC) 03/11/2020   Low back pain 10/31/2019   Abnormal chest x-ray 02/24/2019   Radiculopathy, lumbar region 09/23/2018   Atrophy of quadriceps femoris muscle 09/13/2018   GERD (gastroesophageal reflux disease) 03/22/2018   Hypertension 03/22/2018   Lumbar herniated disc 03/22/2014   BPH (benign prostatic hyperplasia) 03/03/2012   Family history of ischemic heart disease (IHD) 08/10/2011   Colon adenoma 06/11/2011   Vitamin D  deficiency 06/11/2011   ED (erectile dysfunction) 06/02/2011   Hyperlipidemia 03/22/2002   Past Surgical History:  Procedure Laterality Date   CLOSED REDUCTION NASAL FRACTURE  N/A 05/26/2018   Procedure: CLOSED REDUCTION NASAL FRACTURE;  Surgeon: Prescott Brodie, MD;  Location: [facility];  Service: ENT;  Laterality: N/A;   COLONOSCOPY W/ POLYPECTOMY  2014   LUMBAR DISC SURGERY     SPINE  SURGERY  05/13/2020   WISDOM TOOTH EXTRACTION      Family History  Problem Relation Age of Onset   Heart attack Father    Heart Problems Father    Hypertension Father    Hyperlipidemia Father    Colon cancer Neg Hx    Pancreatic cancer Neg Hx    Stomach cancer Neg Hx    Colon polyps Neg Hx    Esophageal cancer Neg Hx    Rectal cancer Neg Hx     Medications- reviewed and updated Current Outpatient Medications  Medication Sig Dispense Refill   losartan -hydrochlorothiazide  (HYZAAR ) 50-12.5 MG tablet TAKE 1 TABLET BY MOUTH DAILY 90 tablet 3   metoprolol  tartrate (LOPRESSOR ) 25 MG tablet TAKE 1 TABLET BY MOUTH 2 TIMES A DAY 180 tablet 1   tadalafil  (CIALIS ) 5 MG tablet Take 1 tablet (5 mg total) by mouth daily. 90 tablet 3   traMADol  (ULTRAM ) 50 MG tablet Take 50-100 mg by mouth every 6 (six) hours as needed for moderate pain or severe pain.     atorvastatin  (LIPITOR) 20 MG tablet Take 1 tablet (20 mg total) by mouth at bedtime. 90 tablet 3   omeprazole  (PRILOSEC) 20 MG capsule Take 1 capsule (20 mg total) by mouth 2 (two) times daily before a meal. 180 capsule 3   traZODone  (DESYREL ) 50 MG tablet Take 0.5-1 tablets (25-50 mg total) by mouth at bedtime as needed. for sleep 90 tablet 3   No current facility-administered medications for this visit.    Allergies-reviewed and updated No Known Allergies  Social History   Socioeconomic History   Marital status: Married    Spouse name: Not on file   Number of children: 0   Years of education: Not on file   Highest education level: Some college, no degree  Occupational History   Not on file  Tobacco Use   Smoking status: Never   Smokeless tobacco: Never  Vaping Use   Vaping status: Never Used  Substance and Sexual Activity   Alcohol use: Yes    Alcohol/week: 1.0 standard drink of alcohol    Types: 1 Standard drinks or equivalent per week    Comment: rarely   Drug use: No   Sexual activity: Yes    Birth control/protection:  None  Other Topics Concern   Not on file  Social History Narrative   Right handed    Caffeine 33 -4 cups daily    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   Social Drivers of Corporate investment banker Strain: Not on file  Food Insecurity: Not on file  Transportation Needs: Not on file  Physical Activity: Not on file  Stress: Not on file  Social Connections: Not on file        Objective:  Physical Exam: BP 127/76   Pulse (!) 55   Temp 97.9 F (36.6 C) (Temporal)   Ht 6' (1.829 m)   Wt 182 lb 9.6 oz (82.8 kg)   SpO2 97%   BMI 24.77 kg/m   Body mass index is 24.77 kg/m. Wt Readings from Last 3 Encounters:  06/04/23 182 lb 9.6 oz (82.8 kg)  10/01/22 179 lb (  81.2 kg)  06/02/22 191 lb 12.8 oz (87 kg)   Gen: NAD, resting comfortably HEENT: TMs normal bilaterally. OP clear. No thyromegaly noted.  CV: RRR with no murmurs appreciated Pulm: NWOB, CTAB with no crackles, wheezes, or rhonchi GI: Normal bowel sounds present. Soft, Nontender, Nondistended. MSK: no edema, cyanosis, or clubbing noted.  Trace pretibial edema bilaterally.  Several scattered varicosities noted. Skin: warm, dry Neuro: CN2-12 grossly intact. Strength 5/5 in upper and lower extremities. Reflexes symmetric and intact bilaterally.  Psych: Normal affect and thought content     Tawnie Ehresman M. Daneil Dunker, MD 06/04/2023 9:25 AM

## 2023-06-04 NOTE — Assessment & Plan Note (Signed)
 Well-controlled on losartan /HCTZ 50-12.5 once daily.

## 2023-06-04 NOTE — Assessment & Plan Note (Signed)
 Stable on omeprazole  nightly.  Will refill today.  Check labs.

## 2023-06-04 NOTE — Assessment & Plan Note (Signed)
 Check vitamin D.

## 2023-06-08 ENCOUNTER — Ambulatory Visit: Payer: Self-pay | Admitting: Family Medicine

## 2023-06-08 NOTE — Progress Notes (Signed)
 Labs are all stable.  Bilirubin level is down a little bit compared to last year.  His white blood cell count is very mildly decreased though similar to previous values over the last few years.  All his other labs are at goal.  Do not need to make any changes to his treatment plan at this time.  He should continue to work on diet and exercise and we can recheck again in a year or so.

## 2023-07-19 ENCOUNTER — Encounter: Payer: Self-pay | Admitting: Family Medicine

## 2023-09-23 ENCOUNTER — Telehealth: Payer: Self-pay | Admitting: *Deleted

## 2023-09-23 MED ORDER — COVID-19 MRNA VAC-TRIS(PFIZER) 30 MCG/0.3ML IM SUSY: 0.3000 mL | PREFILLED_SYRINGE | Freq: Once | INTRAMUSCULAR | 0 refills | Status: DC

## 2023-09-23 MED ORDER — COVID-19 MRNA VACC (MODERNA) 50 MCG/0.5ML IM SUSP: 0.5000 mL | Freq: Once | INTRAMUSCULAR | 0 refills | Status: AC

## 2023-09-23 NOTE — Telephone Encounter (Signed)
 Copied from CRM #[id number]. Topic: Clinical - Medication Question >> Sep 23, 2023  1:40 PM Dedra B wrote: Reason for CRM: Pt is requesting a prescription for the covid vaccine be sent to [facility] on [address] Pls notify pt when it has been sent.   Rx send to pharmacy  [facility]

## 2023-09-23 NOTE — Telephone Encounter (Signed)
 Copied from CRM #[id number]. Topic: Clinical - Medication Question >> Sep 23, 2023  1:40 PM Dedra B wrote: Reason for CRM: Pt is requesting a prescription for the covid vaccine be sent to [facility] on [address] Pls notify pt when it has been sent.

## 2023-10-06 ENCOUNTER — Other Ambulatory Visit: Payer: Self-pay | Admitting: Family Medicine

## 2023-10-21 ENCOUNTER — Other Ambulatory Visit: Payer: Self-pay | Admitting: Family Medicine

## 2023-11-22 ENCOUNTER — Telehealth: Admitting: Family Medicine

## 2023-11-22 ENCOUNTER — Encounter: Payer: Self-pay | Admitting: Family Medicine

## 2023-11-22 VITALS — Ht 72.0 in | Wt 181.0 lb

## 2023-11-22 DIAGNOSIS — B001 Herpesviral vesicular dermatitis: Secondary | ICD-10-CM | POA: Diagnosis not present

## 2023-11-22 MED ORDER — VALACYCLOVIR HCL 1 G PO TABS: 2000.0000 mg | ORAL_TABLET | Freq: Two times a day (BID) | ORAL | 0 refills | Status: AC

## 2023-11-22 NOTE — Assessment & Plan Note (Signed)
 No red flags.  Patient developing cold sore on lower lip.  Did well with Valtrex  a couple of years ago.  Will refill today 2000 mg twice daily for 1 day as needed for flares.  We did discuss preventative strategies however given he is only had a few recurrences do not think this is necessary at this point.

## 2023-11-22 NOTE — Progress Notes (Signed)
   [name] is a 67 y.o. male who presents today for a virtual office visit.  Assessment/Plan:   Chronic Problems Addressed Today: Cold sore No red flags.  Patient developing cold sore on lower lip.  Did well with Valtrex  a couple of years ago.  Will refill today 2000 mg twice daily for 1 day as needed for flares.  We did discuss preventative strategies however given he is only had a few recurrences do not think this is necessary at this point.     Subjective:  HPI:  See Assessment / plan for status of chronic conditions. He is here today to discuss cold sore on his lower lip. This had been an intermittent when he was a teenager but did not have any recurrence until a couple of years ago. That was treated with a course of Valtrex  which he did well with.  He has been under quite a bit of stress recently which he thinks may have exacerbated recent flareup.         Objective/Observations  Physical Exam: Gen: NAD, resting comfortably Pulm: Normal work of breathing Neuro: Grossly normal, moves all extremities Psych: Normal affect and thought content  Virtual Visit via Video   I connected with Mason Vargas on 11/22/23 at  8:40 AM EST by a video enabled telemedicine application and verified that I am speaking with the correct person using two identifiers. The limitations of evaluation and management by telemedicine and the availability of in person appointments were discussed. The patient expressed understanding and agreed to proceed.   Patient location: Home Provider location: [place] [facility] [employer] [facility] Persons participating in the virtual visit: Myself and Patient     Worth HERO. Kennyth, MD 11/22/2023 9:20 AM

## 2024-05-07 IMAGING — CT CT CARDIAC CORONARY ARTERY CALCIUM SCORE
3 series · 14 of 20 positions shown, 16 images · non-contrast
Comparison: Chest CTA 08/30/2020.
COMPARISON: Chest CTA 08/30/2020.

Addendum:
EXAM:
OVER-READ INTERPRETATION  CT CHEST

The following report is a limited chest CT over-read performed by
06/25/2021. The coronary calcium score interpretation by the
cardiologist is attached.
CLINICAL DATA: Cardiovascular Disease Risk stratification
Coronary Calcium Score
TECHNIQUE: A gated, non-contrast computed tomography scan of the heart was
performed using 3mm slice thickness. Axial images were analyzed on a
dedicated workstation. Calcium scoring of the coronary arteries was
performed using the Agatston method.

[Series 2: cascseq 2.0 sa36 70% (id) · axial · 0.39mm/px · z∈[-245,-155]mm · 4 of 76 slices shown]
[im 16/76  vessel]
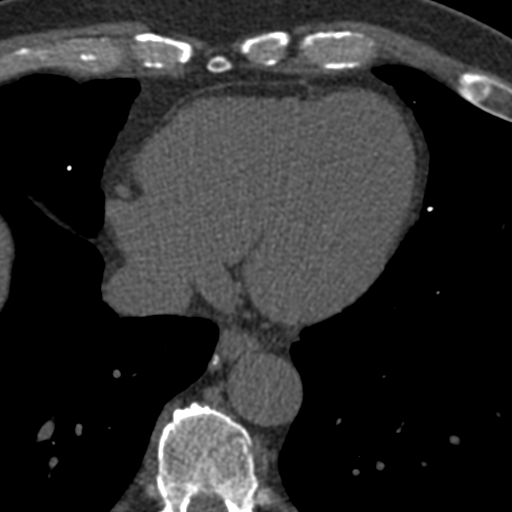
[im 31/76  vessel]
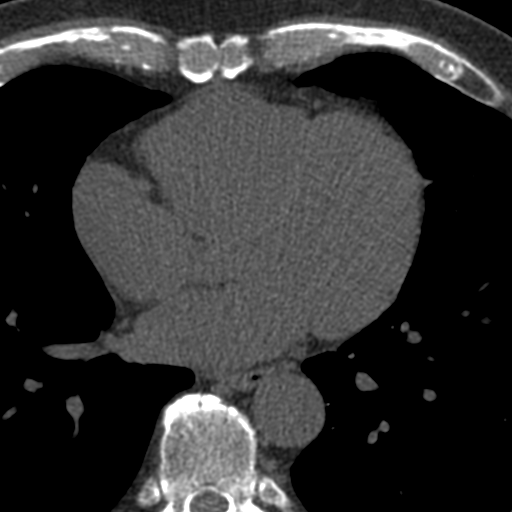
[im 46/76  vessel]
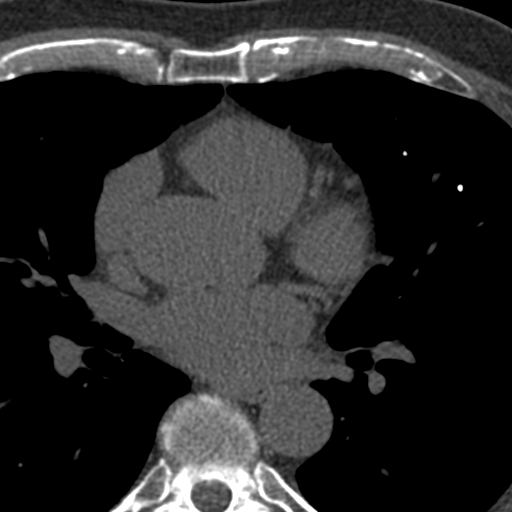
[im 61/76  vessel]
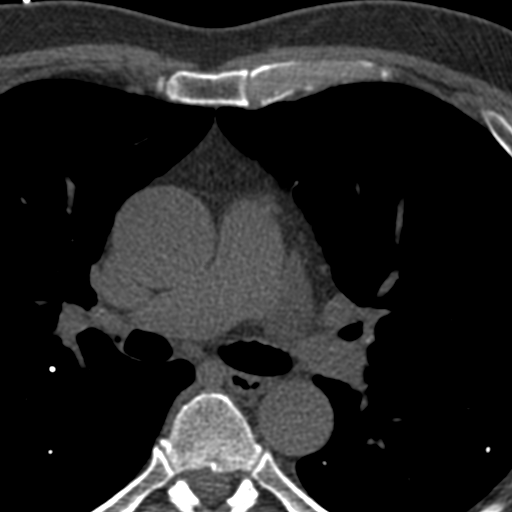

[Series 3: cascseq 2.0 bf37 st · axial · 0.73mm/px · z∈[-251,-151]mm · 5 of 76 slices shown, 7 images]
[im 13/76  vessel]
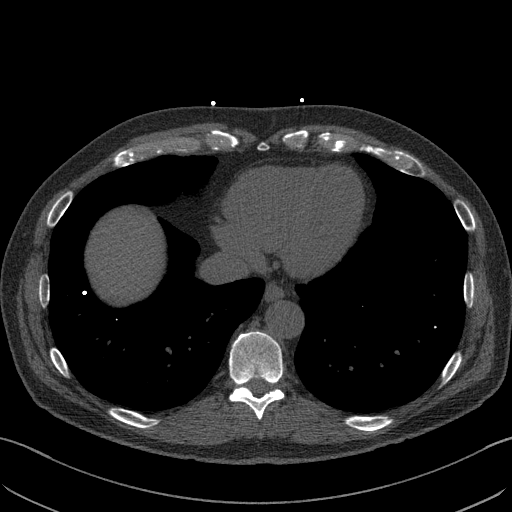
[im 13/76  lung]
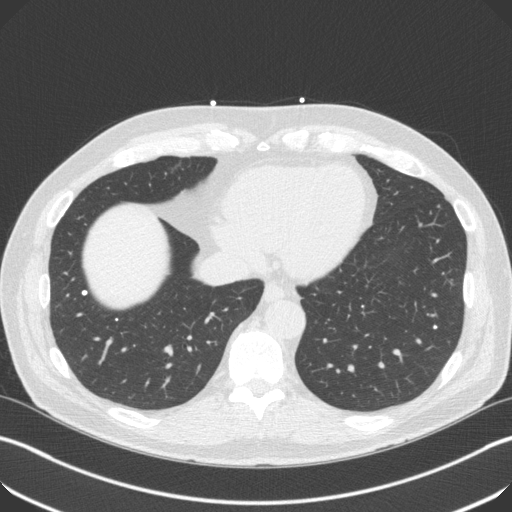
[im 26/76  vessel]
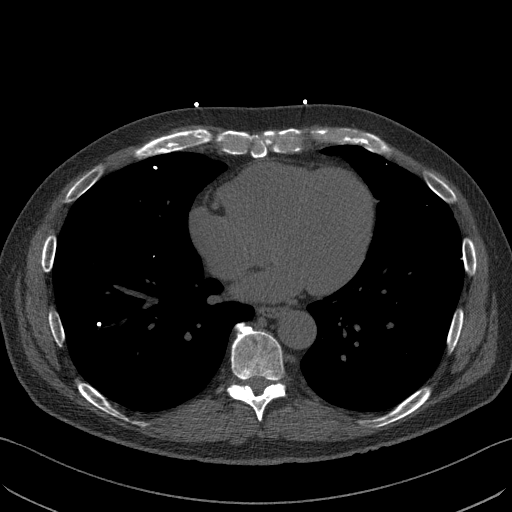
[im 38/76  vessel]
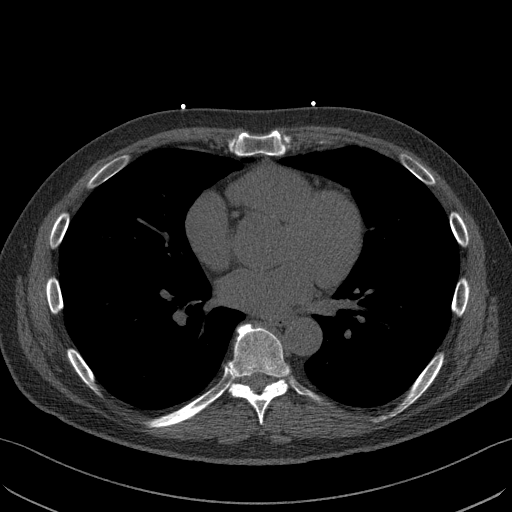
[im 51/76  vessel]
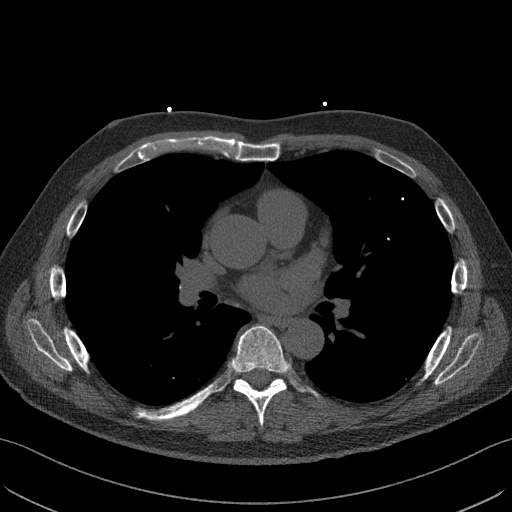
[im 63/76  vessel]
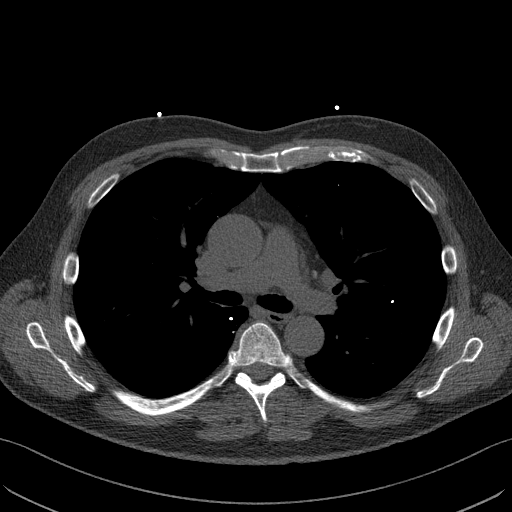
[im 63/76  lung]
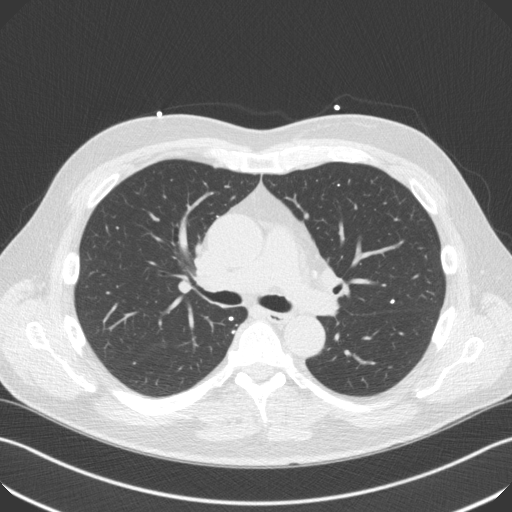

[Series 4: cascseq 2.0 br59 lung · axial · 0.73mm/px · z∈[-251,-151]mm · 5 of 76 slices shown]
[im 13/76  lung]
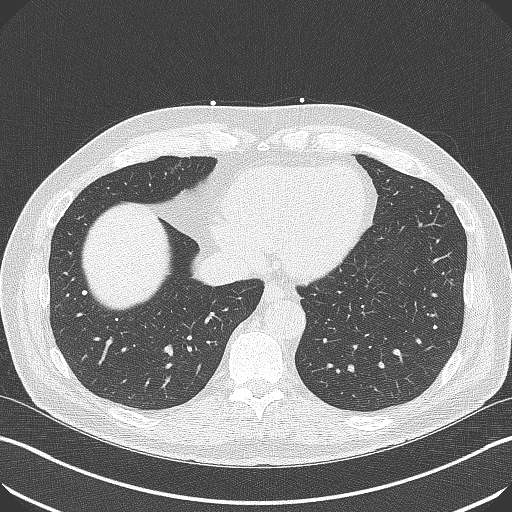
[im 26/76  lung]
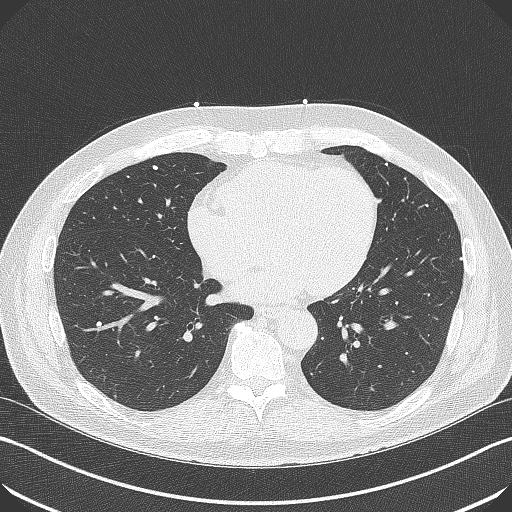
[im 38/76  lung]
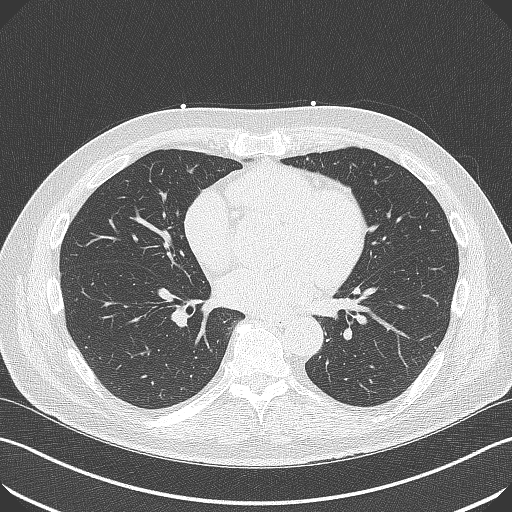
[im 51/76  lung]
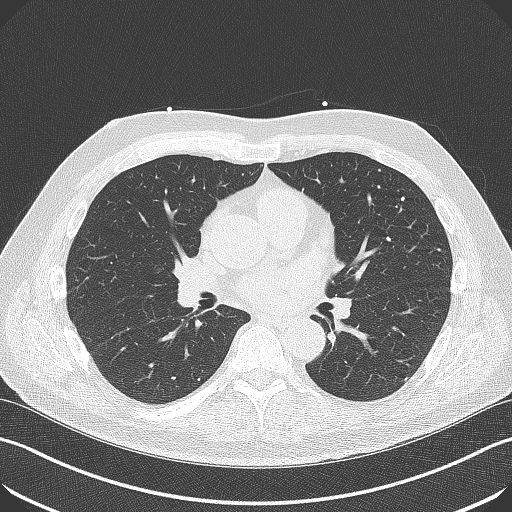
[im 63/76  lung]
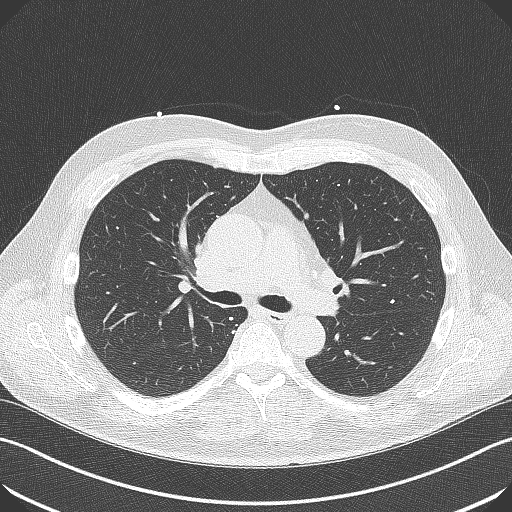

[14 of 20 positions shown; findings below may reference images not displayed]

FINDINGS: Innumerable small calcified granulomas are scattered throughout both
lungs. Ectasia of ascending thoracic aorta (4.1 cm in diameter).
Within the visualized portions of the thorax there are no suspicious
appearing pulmonary nodules or masses, there is no acute
consolidative airspace disease, no pleural effusions, no
pneumothorax and no lymphadenopathy. Visualized portions of the
upper abdomen are unremarkable. There are no aggressive appearing
lytic or blastic lesions noted in the visualized portions of the
skeleton.
IMPRESSION: 1. Ectasia of the ascending thoracic aorta (4.1 cm in diameter).
Recommend annual imaging followup by CTA or MRA. This recommendation
follows 1191 ACCF/AHA/AATS/ACR/ASA/SCA/YOWHANES/SRISHTY/GAGLIARDI/SAVARINO Guidelines
for the Diagnosis and Management of Patients with Thoracic Aortic
Disease. Circulation. 1191; 121: E266-e369. Aortic aneurysm NOS
([id number].C).
2. Old granulomatous disease.
FINDINGS: Coronary arteries: Normal origins.

Coronary Calcium Score:

Left main: 7

Left anterior descending artery: 19

Left circumflex artery: 6

Right coronary artery: 0

Total: 125

Percentile: 59

Pericardium: Normal.

Aorta: Mildly dilated caliber of ascending aorta. No aortic
atherosclerosis noted.

Non-cardiac: See separate report from [REDACTED].
IMPRESSION: Coronary calcium score of 125. This was 59th percentile for age-,
race-, and sex-matched controls. Mildly dilated caliber of ascending
aorta (~41 mm).



If CAC=0, it is reasonable to withhold statin therapy and reassess
in 5 to 10 years, as long as higher risk conditions are absent
(diabetes mellitus, family history of premature CHD in first degree
relatives (males <55 years; females <65 years), cigarette smoking,
or LDL >=190 mg/dL).

If CAC is 1 to 99, it is reasonable to initiate statin therapy for
patients >=55 years of age.

If CAC is >=100 or >=75th percentile, it is reasonable to initiate
statin therapy at any age.

Cardiology referral should be considered for patients with CAC
scores >=400 or >=75th percentile.

*6797 AHA/ACC/AACVPR/AAPA/ABC/ROSALEE/TRUPTI/ADELAIDA/Nunu/DOJA/GREGORYOBGYN/TRE
Guideline on the Management of Blood Cholesterol: A Report of the
American College of Cardiology/American Heart Association Task Force
on Clinical Practice Guidelines. J Am Coll Cardiol.
8275;73(24):9631-9031.

*** End of Addendum ***
EXAM:
OVER-READ INTERPRETATION  CT CHEST

The following report is a limited chest CT over-read performed by
06/25/2021. The coronary calcium score interpretation by the
cardiologist is attached.
FINDINGS: Innumerable small calcified granulomas are scattered throughout both
lungs. Ectasia of ascending thoracic aorta (4.1 cm in diameter).
Within the visualized portions of the thorax there are no suspicious
appearing pulmonary nodules or masses, there is no acute
consolidative airspace disease, no pleural effusions, no
pneumothorax and no lymphadenopathy. Visualized portions of the
upper abdomen are unremarkable. There are no aggressive appearing
lytic or blastic lesions noted in the visualized portions of the
skeleton.
IMPRESSION: 1. Ectasia of the ascending thoracic aorta (4.1 cm in diameter).
Recommend annual imaging followup by CTA or MRA. This recommendation
follows 1191 ACCF/AHA/AATS/ACR/ASA/SCA/YOWHANES/SRISHTY/GAGLIARDI/SAVARINO Guidelines
for the Diagnosis and Management of Patients with Thoracic Aortic
Disease. Circulation. 1191; 121: E266-e369. Aortic aneurysm NOS
([id number].C).
2. Old granulomatous disease.

## 2024-06-08 ENCOUNTER — Encounter: Admitting: Family Medicine
